# Patient Record
Sex: Female | Born: 1990 | Race: White | Hispanic: No | Marital: Married | Smoking: Never smoker
Health system: Southern US, Community
[De-identification: ages and names within clinical notes are randomized; demographics above are authoritative.]

## PROBLEM LIST (undated history)

## (undated) ENCOUNTER — Inpatient Hospital Stay (HOSPITAL_COMMUNITY): Payer: Self-pay

## (undated) ENCOUNTER — Emergency Department (HOSPITAL_COMMUNITY): Payer: Self-pay | Source: Home / Self Care

## (undated) DIAGNOSIS — F32A Depression, unspecified: Secondary | ICD-10-CM

## (undated) DIAGNOSIS — K760 Fatty (change of) liver, not elsewhere classified: Secondary | ICD-10-CM

## (undated) DIAGNOSIS — D649 Anemia, unspecified: Secondary | ICD-10-CM

## (undated) DIAGNOSIS — R519 Headache, unspecified: Secondary | ICD-10-CM

## (undated) DIAGNOSIS — F329 Major depressive disorder, single episode, unspecified: Secondary | ICD-10-CM

## (undated) DIAGNOSIS — O039 Complete or unspecified spontaneous abortion without complication: Secondary | ICD-10-CM

## (undated) DIAGNOSIS — O165 Unspecified maternal hypertension, complicating the puerperium: Secondary | ICD-10-CM

## (undated) DIAGNOSIS — O1490 Unspecified pre-eclampsia, unspecified trimester: Secondary | ICD-10-CM

## (undated) DIAGNOSIS — O209 Hemorrhage in early pregnancy, unspecified: Secondary | ICD-10-CM

## (undated) DIAGNOSIS — Z3046 Encounter for surveillance of implantable subdermal contraceptive: Principal | ICD-10-CM

## (undated) DIAGNOSIS — Z309 Encounter for contraceptive management, unspecified: Secondary | ICD-10-CM

## (undated) DIAGNOSIS — R51 Headache: Secondary | ICD-10-CM

## (undated) HISTORY — DX: Complete or unspecified spontaneous abortion without complication: O03.9

## (undated) HISTORY — DX: Encounter for surveillance of implantable subdermal contraceptive: Z30.46

## (undated) HISTORY — DX: Anemia, unspecified: D64.9

## (undated) HISTORY — DX: Unspecified maternal hypertension, complicating the puerperium: O16.5

## (undated) HISTORY — DX: Hemorrhage in early pregnancy, unspecified: O20.9

## (undated) HISTORY — DX: Encounter for contraceptive management, unspecified: Z30.9

## (undated) HISTORY — DX: Fatty (change of) liver, not elsewhere classified: K76.0

## (undated) HISTORY — PX: NO PAST SURGERIES: SHX2092

---
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
## 2012-12-21 ENCOUNTER — Encounter (HOSPITAL_COMMUNITY): Payer: Self-pay

## 2012-12-21 ENCOUNTER — Emergency Department (HOSPITAL_COMMUNITY)
Admission: EM | Admit: 2012-12-21 | Discharge: 2012-12-22 | Disposition: A | Discharge status: Home / Self Care | Payer: Self-pay | Attending: Emergency Medicine | Admitting: Emergency Medicine

## 2012-12-21 DIAGNOSIS — H6092 Unspecified otitis externa, left ear: Secondary | ICD-10-CM

## 2012-12-21 DIAGNOSIS — H60399 Other infective otitis externa, unspecified ear: Secondary | ICD-10-CM | POA: Insufficient documentation

## 2012-12-21 NOTE — ED Notes (Signed)
Left ear pain since yesterday

## 2012-12-22 MED ORDER — NEOMYCIN-POLYMYXIN-HC 3.5-10000-1 OT SOLN
4.0000 [drp] | Freq: Four times a day (QID) | OTIC | Status: DC
  Administered 2012-12-22: 4 [drp] via OTIC
  Filled 2012-12-22: qty 10

## 2012-12-22 NOTE — ED Provider Notes (Signed)
History     CSN: [id number]  Arrival date & time 12/21/12  2338   First MD Initiated Contact with Patient 12/22/12 0006      Chief Complaint  Patient presents with  . Otalgia    (Consider location/radiation/quality/duration/timing/severity/associated sxs/prior treatment) HPI Melanie Price is a 22 y.o. female who presents to the ED with left ear pain that started yesterday. She denies fever or chills. The pain is worse with movement of the external ear. She denies any other problems.  History reviewed. No pertinent past medical history.  History reviewed. No pertinent past surgical history.  No family history on file.  History  Substance Use Topics  . Smoking status: Never Smoker   . Smokeless tobacco: Not on file  . Alcohol Use: No    OB History   Grav Para Term Preterm Abortions TAB SAB Ect Mult Living                  Review of Systems  Constitutional: Negative for fever and chills.  HENT: Positive for ear pain. Negative for congestion, sore throat, neck pain and sinus pressure.   Eyes: Negative for pain.  Respiratory: Negative for cough.   Gastrointestinal: Negative for nausea and vomiting.  Skin: Negative for rash.  Neurological: Negative for headaches.  Psychiatric/Behavioral: The patient is not nervous/anxious.     Allergies  Review of patient's allergies indicates no known allergies.  Home Medications  No current outpatient prescriptions on file.  BP 118/67  Pulse 93  Temp(Src) 98.4 F (36.9 C) (Oral)  Resp 16  Ht 5\' 7"  (1.702 m)  Wt 130 lb (58.968 kg)  BMI 20.36 kg/m2  SpO2 100%  LMP 12/04/2012  Physical Exam  Nursing note and vitals reviewed. Constitutional: She is oriented to person, place, and time. She appears well-developed and well-nourished.  HENT:  Head: Normocephalic.  Right Ear: Tympanic membrane normal.  Left Ear: There is tenderness. No mastoid tenderness.  There is swelling and white drainage in the left ear canal. Unable to  visualize the TM. There is tenderness of the ear canal with movement of the external ear.   Eyes: EOM are normal.  Neck: Neck supple.  Cardiovascular: Normal rate.   Pulmonary/Chest: Effort normal.  Musculoskeletal: Normal range of motion.  Neurological: She is alert and oriented to person, place, and time. No cranial nerve deficit.  Skin: Skin is warm and dry.  Psychiatric: She has a normal mood and affect. Her behavior is normal. Judgment and thought content normal.    ED Course  Procedures (including critical care time)  MDM  22 y.o. female with external otitis. I have discussed findings with the patient and plan of care. Will administer Cortisporin Otic Suspension ear drops now and patient will continue every 6 hours. She will return if her symptoms worsen. She will take ibuprofen for the discomfort.  I have reviewed this patient's vital signs, nurses notes and patient is stable for discharge home.       [address] [place], [place] 12/22/12 [phone]

## 2012-12-22 NOTE — ED Provider Notes (Signed)
Medical screening examination/treatment/procedure(s) were performed by non-physician practitioner and as supervising physician I was immediately available for consultation/collaboration.  Nicoletta Dress. Colon Branch, MD 12/22/12 [phone]

## 2012-12-31 ENCOUNTER — Encounter (HOSPITAL_COMMUNITY): Payer: Self-pay

## 2012-12-31 ENCOUNTER — Emergency Department (HOSPITAL_COMMUNITY)
Admission: EM | Admit: 2012-12-31 | Discharge: 2012-12-31 | Disposition: A | Discharge status: Home / Self Care | Payer: Self-pay | Attending: Emergency Medicine | Admitting: Emergency Medicine

## 2012-12-31 DIAGNOSIS — H6123 Impacted cerumen, bilateral: Secondary | ICD-10-CM

## 2012-12-31 DIAGNOSIS — H6120 Impacted cerumen, unspecified ear: Secondary | ICD-10-CM | POA: Insufficient documentation

## 2012-12-31 MED ORDER — DOCUSATE SODIUM 50 MG/5ML PO LIQD
50.0000 mg | Freq: Once | ORAL | Status: AC
  Administered 2012-12-31: 5 mg via OTIC
  Filled 2012-12-31: qty 10

## 2012-12-31 MED ORDER — DOCUSATE SODIUM 50 MG/5ML PO LIQD
ORAL | Status: AC
  Filled 2012-12-31: qty 10

## 2012-12-31 NOTE — ED Provider Notes (Signed)
History     CSN: [id number]  Arrival date & time 12/31/12  0220   First MD Initiated Contact with Patient 12/31/12 [phone]      Chief Complaint  Patient presents with  . Otalgia    (Consider location/radiation/quality/duration/timing/severity/associated sxs/prior treatment) HPI Patient states she was seen about 2 weeks ago for pain in her left ear. She was told she had some ear wax in her ear and she was given eardrops to use. She states she feels like she can't hear out of either ear. She also states she feels like there is a polyp in her ear canal on the left. She denies fever or coughing states has mild rhinorrhea.  PCP none  History reviewed. No pertinent past medical history.  History reviewed. No pertinent past surgical history.  No family history on file.  History  Substance Use Topics  . Smoking status: Never Smoker   . Smokeless tobacco: Not on file  . Alcohol Use: No  employed  OB History   Grav Para Term Preterm Abortions TAB SAB Ect Mult Living                  Review of Systems  All other systems reviewed and are negative.    Allergies  Review of patient's allergies indicates no known allergies.  Home Medications  No current outpatient prescriptions on file.  BP 113/71  Pulse 73  Temp(Src) 98.3 F (36.8 C) (Oral)  Resp 16  Ht 5\' 7"  (1.702 m)  Wt 130 lb (58.968 kg)  BMI 20.36 kg/m2  SpO2 100%  LMP 12/04/2012  Vital signs normal    Physical Exam  Nursing note and vitals reviewed. Constitutional: She is oriented to person, place, and time. She appears well-developed and well-nourished.  Non-toxic appearance. She does not appear ill. No distress.  HENT:  Head: Normocephalic and atraumatic.  Right Ear: External ear normal.  Left Ear: External ear normal.  Nose: Nose normal. No mucosal edema or rhinorrhea.  Mouth/Throat: Oropharynx is clear and moist and mucous membranes are normal. No dental abscesses or edematous.  Patient has bilateral  cerumen impaction that is deep into her ear canal.   Eyes: Conjunctivae and EOM are normal. Pupils are equal, round, and reactive to light.  Neck: Normal range of motion and full passive range of motion without pain. Neck supple.  Pulmonary/Chest: Effort normal. No respiratory distress. She has no rhonchi. She exhibits no crepitus.  Abdominal: Normal appearance.  Musculoskeletal: Normal range of motion. She exhibits no edema and no tenderness.  Moves all extremities well.   Lymphadenopathy:    She has no cervical adenopathy.  Neurological: She is alert and oriented to person, place, and time. She has normal strength. No cranial nerve deficit.  Skin: Skin is warm, dry and intact. No rash noted. No erythema. No pallor.  Psychiatric: She has a normal mood and affect. Her speech is normal and behavior is normal. Her mood appears not anxious.    ED Course  Procedures (including critical care time)  Medications  docusate (COLACE) 50 MG/5ML liquid 50 mg (5 mg Both Ears Given 12/31/12 0451)   Nursing hs flushed her ears without results    1. Cerumen impaction, bilateral     Medication OTC debrox   Plan discharge  Devoria Albe, MD, FACEP   MDM          Ward Givens, MD 12/31/12 [phone]

## 2012-12-31 NOTE — ED Notes (Signed)
Pt states she was seen here a couple of weeks ago for ear pain and given drops.  Pt states pain improved for couple of days, now pain is into jaw and neck

## 2013-08-06 NOTE — L&D Delivery Note (Signed)
Delivery Note At 8:01 PM a viable and healthy female was delivered via Vaginal, Spontaneous Delivery (Presentation: Right Occiput Anterior).  APGAR: 8, 10; weight  .   Placenta status: Intact, Spontaneous.  Cord: 3 vessels with the following complications: None.   Anesthesia: Epidural  Episiotomy: none Lacerations:  1st degree labial, repaired   Suture Repair: 3.0 vicryl Est. Blood Loss (mL): 240   Mother doing well, bonding well with baby, skin to skin  Mom to postpartum.  Baby to Couplet care / Skin to Skin.  ADAMS,SHNIQUAL SHWON 06/17/2014, 8:42 PM   I was present for the above.  CRESENZO-DISHMAN,Peggy Loge

## 2013-10-21 ENCOUNTER — Encounter (HOSPITAL_COMMUNITY): Payer: Self-pay | Admitting: Emergency Medicine

## 2013-10-21 DIAGNOSIS — O9989 Other specified diseases and conditions complicating pregnancy, childbirth and the puerperium: Secondary | ICD-10-CM | POA: Insufficient documentation

## 2013-10-21 DIAGNOSIS — R109 Unspecified abdominal pain: Secondary | ICD-10-CM | POA: Insufficient documentation

## 2013-10-21 NOTE — ED Notes (Signed)
Pt denies any vaginal bleeding or discharge 

## 2013-10-21 NOTE — ED Notes (Signed)
Pt reports having a positive pregnancy test x1 week ago with LMP about 1.5 months ago. C/o abdominal cramping x 2-3 weeks.

## 2013-10-22 ENCOUNTER — Emergency Department (HOSPITAL_COMMUNITY)
Admission: EM | Admit: 2013-10-22 | Discharge: 2013-10-22 | Disposition: A | Discharge status: Home / Self Care | Payer: Medicaid Other | Attending: Emergency Medicine | Admitting: Emergency Medicine

## 2013-10-22 ENCOUNTER — Emergency Department (HOSPITAL_COMMUNITY): Payer: Medicaid Other

## 2013-10-22 DIAGNOSIS — O26899 Other specified pregnancy related conditions, unspecified trimester: Secondary | ICD-10-CM

## 2013-10-22 DIAGNOSIS — R109 Unspecified abdominal pain: Secondary | ICD-10-CM

## 2013-10-22 LAB — CBC
HCT: 33.2 % — ABNORMAL LOW (ref 36.0–46.0)
HEMOGLOBIN: 10.2 g/dL — AB (ref 12.0–15.0)
MCH: 23.3 pg — AB (ref 26.0–34.0)
MCHC: 30.7 g/dL (ref 30.0–36.0)
MCV: 75.8 fL — ABNORMAL LOW (ref 78.0–100.0)
PLATELETS: 404 10*3/uL — AB (ref 150–400)
RBC: 4.38 MIL/uL (ref 3.87–5.11)
RDW: 16.1 % — AB (ref 11.5–15.5)
WBC: 10.5 10*3/uL (ref 4.0–10.5)

## 2013-10-22 LAB — URINE MICROSCOPIC-ADD ON

## 2013-10-22 LAB — URINALYSIS, ROUTINE W REFLEX MICROSCOPIC
Bilirubin Urine: NEGATIVE
Glucose, UA: NEGATIVE mg/dL
Hgb urine dipstick: NEGATIVE
Ketones, ur: NEGATIVE mg/dL
NITRITE: NEGATIVE
PH: 5.5 (ref 5.0–8.0)
Protein, ur: NEGATIVE mg/dL
UROBILINOGEN UA: 0.2 mg/dL (ref 0.0–1.0)

## 2013-10-22 LAB — BASIC METABOLIC PANEL
BUN: 10 mg/dL (ref 6–23)
CALCIUM: 9.1 mg/dL (ref 8.4–10.5)
CO2: 26 mEq/L (ref 19–32)
Chloride: 101 mEq/L (ref 96–112)
Creatinine, Ser: 0.66 mg/dL (ref 0.50–1.10)
GLUCOSE: 109 mg/dL — AB (ref 70–99)
Potassium: 4.3 mEq/L (ref 3.7–5.3)
SODIUM: 137 meq/L (ref 137–147)

## 2013-10-22 LAB — HCG, QUANTITATIVE, PREGNANCY: HCG, BETA CHAIN, QUANT, S: 12949 m[IU]/mL — AB (ref <5)

## 2013-10-22 LAB — PREGNANCY, URINE: PREG TEST UR: POSITIVE — AB

## 2013-10-22 LAB — ABO/RH: ABO/RH(D): O POS

## 2013-10-22 MED ORDER — CEPHALEXIN 500 MG PO CAPS: 500.0000 mg | ORAL_CAPSULE | Freq: Four times a day (QID) | ORAL | Status: DC

## 2013-10-22 MED ORDER — ACETAMINOPHEN 325 MG PO TABS
650.0000 mg | ORAL_TABLET | Freq: Once | ORAL | Status: AC
  Administered 2013-10-22: 650 mg via ORAL
  Filled 2013-10-22: qty 2

## 2013-10-22 NOTE — Discharge Instructions (Signed)
Abdominal Pain During Pregnancy °Abdominal pain is common in pregnancy. Most of the time, it does not cause harm. There are many causes of abdominal pain. Some causes are more serious than others. Some of the causes of abdominal pain in pregnancy are easily diagnosed. Occasionally, the diagnosis takes time to understand. Other times, the cause is not determined. Abdominal pain can be a sign that something is very wrong with the pregnancy, or the pain may have nothing to do with the pregnancy at all. For this reason, always tell your health care provider if you have any abdominal discomfort. °HOME CARE INSTRUCTIONS  °Monitor your abdominal pain for any changes. The following actions may help to alleviate any discomfort you are experiencing: °· Do not have sexual intercourse or put anything in your vagina until your symptoms go away completely. °· Get plenty of rest until your pain improves. °· Drink clear fluids if you feel nauseous. Avoid solid food as long as you are uncomfortable or nauseous. °· Only take over-the-counter or prescription medicine as directed by your health care provider. °· Keep all follow-up appointments with your health care provider. °SEEK IMMEDIATE MEDICAL CARE IF: °· You are bleeding, leaking fluid, or passing tissue from the vagina. °· You have increasing pain or cramping. °· You have persistent vomiting. °· You have painful or bloody urination. °· You have a fever. °· You notice a decrease in your baby's movements. °· You have extreme weakness or feel faint. °· You have shortness of breath, with or without abdominal pain. °· You develop a severe headache with abdominal pain. °· You have abnormal vaginal discharge with abdominal pain. °· You have persistent diarrhea. °· You have abdominal pain that continues even after rest, or gets worse. °MAKE SURE YOU:  °· Understand these instructions. °· Will watch your condition. °· Will get help right away if you are not doing well or get  worse. °Document Released: 07/23/2005 Document Revised: 05/13/2013 Document Reviewed: 02/19/2013 °[employer]® Patient Information ©2014 [employer]. ° °

## 2013-10-22 NOTE — ED Provider Notes (Signed)
CSN: [id number]     Arrival date & time 10/21/13  2328 History   First MD Initiated Contact with Patient 10/22/13 0140     Chief Complaint  Patient presents with  . Abdominal Cramping     (Consider location/radiation/quality/duration/timing/severity/associated sxs/prior Treatment) HPI History per patient. LMP about 2 months ago. Positive home pregnancy test. Tonight developed abdominal cramping, midline. Mild to moderate in severity. No back pain. No radiation of pain. No associated vaginal bleeding or discharge. No dysuria, urgency or frequency. No history of same.  No previous pregnancies. No nausea vomiting or diarrhea. History reviewed. No pertinent past medical history. History reviewed. No pertinent past surgical history. No family history on file. History  Substance Use Topics  . Smoking status: Never Smoker   . Smokeless tobacco: Not on file  . Alcohol Use: No   OB History   Grav Para Term Preterm Abortions TAB SAB Ect Mult Living                 Review of Systems  Constitutional: Negative for fever and chills.  Respiratory: Negative for shortness of breath.   Cardiovascular: Negative for chest pain.  Gastrointestinal: Negative for vomiting and abdominal distention.  Genitourinary: Negative for dysuria, frequency, vaginal bleeding and vaginal discharge.  Musculoskeletal: Negative for back pain.  Skin: Negative for rash.  Neurological: Negative for weakness and numbness.  All other systems reviewed and are negative.      Allergies  Review of patient's allergies indicates no known allergies.  Home Medications  No current outpatient prescriptions on file. BP 130/68  Pulse 101  Temp(Src) 97.3 F (36.3 C) (Oral)  Resp 18  Ht 5\' 7"  (1.702 m)  Wt 130 lb (58.968 kg)  BMI 20.36 kg/m2  SpO2 100%  LMP 09/05/2013 Physical Exam  Constitutional: She is oriented to person, place, and time. She appears well-developed and well-nourished.  HENT:  Head: Normocephalic  and atraumatic.  Mouth/Throat: Oropharynx is clear and moist.  Eyes: EOM are normal. Pupils are equal, round, and reactive to light.  Neck: Neck supple.  Cardiovascular: Normal rate, regular rhythm and intact distal pulses.   Pulmonary/Chest: Effort normal and breath sounds normal. No respiratory distress.  Abdominal: Soft. Bowel sounds are normal. She exhibits no distension. There is no tenderness. There is no rebound and no guarding.  No CVA tenderness  Musculoskeletal: Normal range of motion. She exhibits no edema.  Neurological: She is alert and oriented to person, place, and time.  Skin: Skin is warm and dry.    ED Course  Procedures (including critical care time) Labs Review Labs Reviewed  URINALYSIS, ROUTINE W REFLEX MICROSCOPIC - Abnormal; Notable for the following:    Specific Gravity, Urine >1.030 (*)    Leukocytes, UA TRACE (*)    All other components within normal limits  PREGNANCY, URINE - Abnormal; Notable for the following:    Preg Test, Ur POSITIVE (*)    All other components within normal limits  HCG, QUANTITATIVE, PREGNANCY - Abnormal; Notable for the following:    hCG, Beta Chain, Quant, S [id number] (*)    All other components within normal limits  CBC - Abnormal; Notable for the following:    Hemoglobin 10.2 (*)    HCT 33.2 (*)    MCV 75.8 (*)    MCH 23.3 (*)    RDW 16.1 (*)    Platelets 404 (*)    All other components within normal limits  BASIC METABOLIC PANEL - Abnormal; Notable for the  following:    Glucose, Bld 109 (*)    All other components within normal limits  URINE MICROSCOPIC-ADD ON - Abnormal; Notable for the following:    Squamous Epithelial / LPF MANY (*)    Bacteria, UA MANY (*)    All other components within normal limits  ABO/RH   Imaging Review [place] Ob Comp Less 14 Wks  10/22/2013   CLINICAL DATA:  Pregnant patient with pelvic cramping.  EXAM: OBSTETRIC <14 WK US AND TRANSVAGINAL OB US  TECHNIQUE: Both transabdominal and transvaginal  ultrasound examinations were performed for complete evaluation of the gestation as well as the maternal uterus, adnexal regions, and pelvic cul-de-sac. Transvaginal technique was performed to assess early pregnancy.  COMPARISON:  None.  FINDINGS: Intrauterine gestational sac: Visualized/normal in shape.  Yolk sac:  Visualized.  Embryo:  Visualized.  Cardiac Activity: Detected.  Heart Rate:  130 bpm  CRL:   5.6  mm   6 w 2 d                  US EDC: 06/15/2014  Maternal uterus/adnexae: Unremarkable.  IMPRESSION: Single living intrauterine pregnancy as described. No acute finding.   Electronically Signed   By: Drusilla Kannerhomas  Dalessio M.D.   On: 10/22/2013 03:23   [place] Ob Transvaginal  10/22/2013   CLINICAL DATA:  Pregnant patient with pelvic cramping.  EXAM: OBSTETRIC <14 WK US AND TRANSVAGINAL OB US  TECHNIQUE: Both transabdominal and transvaginal ultrasound examinations were performed for complete evaluation of the gestation as well as the maternal uterus, adnexal regions, and pelvic cul-de-sac. Transvaginal technique was performed to assess early pregnancy.  COMPARISON:  None.  FINDINGS: Intrauterine gestational sac: Visualized/normal in shape.  Yolk sac:  Visualized.  Embryo:  Visualized.  Cardiac Activity: Detected.  Heart Rate:  130 bpm  CRL:   5.6  mm   6 w 2 d                  US EDC: 06/15/2014  Maternal uterus/adnexae: Unremarkable.  IMPRESSION: Single living intrauterine pregnancy as described. No acute finding.   Electronically Signed   By: Drusilla Kannerhomas  Dalessio M.D.   On: 10/22/2013 03:23    Tylenol provided. On recheck, no further cramping. Abdomen soft, nontender, nondistended.  Ultrasound results reviewed and shared with patient as above. Plan discharge home with outpatient OB/GYN followup. Patient will start taking prenatal vitamins. She agrees to strict return precautions.  MDM   Diagnosis: Pregnancy, abdominal cramping  Evaluated with labs, urinalysis and ultrasound as above. No ectopic  pregnancy. Medication provided Vital signs and nursing notes reviewed and considered   Sunnie NielsenBrian Grahm Etsitty, MD 10/22/13 [phone]

## 2013-10-22 NOTE — ED Notes (Signed)
Pt reporting abdominal cramping for several days. Denies nausea or vomiting.  Reports positive home pregnancy test.  Denies bleeding or discharge.  Pt also asking about obtaining a due date.

## 2013-11-05 ENCOUNTER — Other Ambulatory Visit: Payer: Self-pay | Admitting: Obstetrics & Gynecology

## 2013-11-05 DIAGNOSIS — O3680X0 Pregnancy with inconclusive fetal viability, not applicable or unspecified: Secondary | ICD-10-CM

## 2013-11-10 ENCOUNTER — Ambulatory Visit (INDEPENDENT_AMBULATORY_CARE_PROVIDER_SITE_OTHER): Payer: Medicaid Other

## 2013-11-10 DIAGNOSIS — O3680X0 Pregnancy with inconclusive fetal viability, not applicable or unspecified: Secondary | ICD-10-CM

## 2013-11-10 NOTE — Progress Notes (Signed)
U/S(9+0wks)-single IUP with +FCA noted, FHR-180 BPM, cx appears closed, bilateral adnexa wnl, CRL c/w dates

## 2013-11-19 ENCOUNTER — Other Ambulatory Visit (HOSPITAL_COMMUNITY)
Admission: RE | Admit: 2013-11-19 | Discharge: 2013-11-19 | Disposition: A | Discharge status: Home / Self Care | Payer: Medicaid Other | Source: Ambulatory Visit | Attending: Obstetrics and Gynecology | Admitting: Obstetrics and Gynecology

## 2013-11-19 ENCOUNTER — Encounter: Payer: Self-pay | Admitting: Advanced Practice Midwife

## 2013-11-19 ENCOUNTER — Ambulatory Visit (INDEPENDENT_AMBULATORY_CARE_PROVIDER_SITE_OTHER): Payer: Medicaid Other | Admitting: Advanced Practice Midwife

## 2013-11-19 VITALS — BP 110/72 | Wt 150.0 lb

## 2013-11-19 DIAGNOSIS — Z331 Pregnant state, incidental: Secondary | ICD-10-CM

## 2013-11-19 DIAGNOSIS — Z1389 Encounter for screening for other disorder: Secondary | ICD-10-CM

## 2013-11-19 DIAGNOSIS — Z01419 Encounter for gynecological examination (general) (routine) without abnormal findings: Secondary | ICD-10-CM | POA: Insufficient documentation

## 2013-11-19 DIAGNOSIS — Z3490 Encounter for supervision of normal pregnancy, unspecified, unspecified trimester: Secondary | ICD-10-CM

## 2013-11-19 DIAGNOSIS — Z3400 Encounter for supervision of normal first pregnancy, unspecified trimester: Secondary | ICD-10-CM | POA: Insufficient documentation

## 2013-11-19 DIAGNOSIS — O99019 Anemia complicating pregnancy, unspecified trimester: Secondary | ICD-10-CM

## 2013-11-19 LAB — CBC
HCT: 34.4 % — ABNORMAL LOW (ref 36.0–46.0)
HEMOGLOBIN: 11.1 g/dL — AB (ref 12.0–15.0)
MCH: 24 pg — AB (ref 26.0–34.0)
MCHC: 32.3 g/dL (ref 30.0–36.0)
MCV: 74.5 fL — ABNORMAL LOW (ref 78.0–100.0)
Platelets: 402 10*3/uL — ABNORMAL HIGH (ref 150–400)
RBC: 4.62 MIL/uL (ref 3.87–5.11)
RDW: 19.2 % — ABNORMAL HIGH (ref 11.5–15.5)
WBC: 11.2 10*3/uL — ABNORMAL HIGH (ref 4.0–10.5)

## 2013-11-19 LAB — POCT URINALYSIS DIPSTICK
Blood, UA: NEGATIVE
Glucose, UA: NEGATIVE
KETONES UA: NEGATIVE
LEUKOCYTES UA: NEGATIVE
Nitrite, UA: NEGATIVE
Protein, UA: NEGATIVE

## 2013-11-19 NOTE — Patient Instructions (Signed)
Pregnancy - First Trimester  During sexual intercourse, millions of sperm go into the vagina. Only 1 sperm will penetrate and fertilize the female egg while it is in the Fallopian tube. One week later, the fertilized egg implants into the wall of the uterus. An embryo begins to develop into a baby. At 6 to 8 weeks, the eyes and face are formed and the heartbeat can be seen on ultrasound. At the end of 12 weeks (first trimester), all the baby's organs are formed. Now that you are pregnant, you will want to do everything you can to have a healthy baby. Two of the most important things are to get good prenatal care and follow your caregiver's instructions. Prenatal care is all the medical care you receive before the baby's birth. It is given to prevent, find, and treat problems during the pregnancy and childbirth.  PRENATAL EXAMS  · During prenatal visits, your weight, blood pressure, and urine are checked. This is done to make sure you are healthy and progressing normally during the pregnancy.  · A pregnant woman should gain 25 to 35 pounds during the pregnancy. However, if you are overweight or underweight, your caregiver will advise you regarding your weight.  · Your caregiver will ask and answer questions for you.  · Blood work, cervical cultures, other necessary tests, and a Pap test are done during your prenatal exams. These tests are done to check on your health and the probable health of your baby. Tests are strongly recommended and done for HIV with your permission. This is the virus that causes AIDS. These tests are done because medicines can be given to help prevent your baby from being born with this infection should you have been infected without knowing it. Blood work is also used to find out your blood type, previous infections, and follow your blood levels (hemoglobin).  · Low hemoglobin (anemia) is common during pregnancy. Iron and vitamins are given to help prevent this. Later in the pregnancy, blood  tests for diabetes will be done along with any other tests if any problems develop.  · You may need other tests to make sure you and the baby are doing well.  CHANGES DURING THE FIRST TRIMESTER   Your body goes through many changes during pregnancy. They vary from person to person. Talk to your caregiver about changes you notice and are concerned about. Changes can include:  · Your menstrual period stops.  · The egg and sperm carry the genes that determine what you look like. Genes from you and your partner are forming a baby. The female genes determine whether the baby is a boy or a girl.  · Your body increases in girth and you may feel bloated.  · Feeling sick to your stomach (nauseous) and throwing up (vomiting). If the vomiting is uncontrollable, call your caregiver.  · Your breasts will begin to enlarge and become tender.  · Your nipples may stick out more and become darker.  · The need to urinate more. Painful urination may mean you have a bladder infection.  · Tiring easily.  · Loss of appetite.  · Cravings for certain kinds of food.  · At first, you may gain or lose a couple of pounds.  · You may have changes in your emotions from day to day (excited to be pregnant or concerned something may go wrong with the pregnancy and baby).  · You may have more vivid and strange dreams.  HOME CARE INSTRUCTIONS   ·   It is very important to avoid all smoking, alcohol and non-prescribed drugs during your pregnancy. These affect the formation and growth of the baby. Avoid chemicals while pregnant to ensure the delivery of a healthy infant.  · Start your prenatal visits by the 12th week of pregnancy. They are usually scheduled monthly at first, then more often in the last 2 months before delivery. Keep your caregiver's appointments. Follow your caregiver's instructions regarding medicine use, blood and lab tests, exercise, and diet.  · During pregnancy, you are providing food for you and your baby. Eat regular, well-balanced  meals. Choose foods such as meat, fish, milk and other low fat dairy products, vegetables, fruits, and whole-grain breads and cereals. Your caregiver will tell you of the ideal weight gain.  · You can help morning sickness by keeping soda crackers at the bedside. Eat a couple before arising in the morning. You may want to use the crackers without salt on them.  · Eating 4 to 5 small meals rather than 3 large meals a day also may help the nausea and vomiting.  · Drinking liquids between meals instead of during meals also seems to help nausea and vomiting.  · A physical sexual relationship may be continued throughout pregnancy if there are no other problems. Problems may be early (premature) leaking of amniotic fluid from the membranes, vaginal bleeding, or belly (abdominal) pain.  · Exercise regularly if there are no restrictions. Check with your caregiver or physical therapist if you are unsure of the safety of some of your exercises. Greater weight gain will occur in the last 2 trimesters of pregnancy. Exercising will help:  · Control your weight.  · Keep you in shape.  · Prepare you for labor and delivery.  · Help you lose your pregnancy weight after you deliver your baby.  · Wear a good support or jogging bra for breast tenderness during pregnancy. This may help if worn during sleep too.  · Ask when prenatal classes are available. Begin classes when they are offered.  · Do not use hot tubs, steam rooms, or saunas.  · Wear your seat belt when driving. This protects you and your baby if you are in an accident.  · Avoid raw meat, uncooked cheese, cat litter boxes, and soil used by cats throughout the pregnancy. These carry germs that can cause birth defects in the baby.  · The first trimester is a good time to visit your dentist for your dental health. Getting your teeth cleaned is okay. Use a softer toothbrush and brush gently during pregnancy.  · Ask for help if you have financial, counseling, or nutritional needs  during pregnancy. Your caregiver will be able to offer counseling for these needs as well as refer you for other special needs.  · Do not take any medicines or herbs unless told by your caregiver.  · Inform your caregiver if there is any mental or physical domestic violence.  · Make a list of emergency phone numbers of family, friends, hospital, and police and fire departments.  · Write down your questions. Take them to your prenatal visit.  · Do not douche.  · Do not cross your legs.  · If you have to stand for long periods of time, rotate you feet or take small steps in a circle.  · You may have more vaginal secretions that may require a sanitary pad. Do not use tampons or scented sanitary pads.  MEDICINES AND DRUG USE IN PREGNANCY  ·   Take prenatal vitamins as directed. The vitamin should contain 1 milligram of folic acid. Keep all vitamins out of reach of children. Only a couple vitamins or tablets containing iron may be fatal to a baby or young child when ingested.  · Avoid use of all medicines, including herbs, over-the-counter medicines, not prescribed or suggested by your caregiver. Only take over-the-counter or prescription medicines for pain, discomfort, or fever as directed by your caregiver. Do not use aspirin, ibuprofen, or naproxen unless directed by your caregiver.  · Let your caregiver also know about herbs you may be using.  · Alcohol is related to a number of birth defects. This includes fetal alcohol syndrome. All alcohol, in any form, should be avoided completely. Smoking will cause low birth rate and premature babies.  · Street or illegal drugs are very harmful to the baby. They are absolutely forbidden. A baby born to an addicted mother will be addicted at birth. The baby will go through the same withdrawal an adult does.  · Let your caregiver know about any medicines that you have to take and for what reason you take them.  SEEK MEDICAL CARE IF:   You have any concerns or worries during your  pregnancy. It is better to call with your questions if you feel they cannot wait, rather than worry about them.  SEEK IMMEDIATE MEDICAL CARE IF:   · An unexplained oral temperature above 102° F (38.9° C) develops, or as your caregiver suggests.  · You have leaking of fluid from the vagina (birth canal). If leaking membranes are suspected, take your temperature and inform your caregiver of this when you call.  · There is vaginal spotting or bleeding. Notify your caregiver of the amount and how many pads are used.  · You develop a bad smelling vaginal discharge with a change in the color.  · You continue to feel sick to your stomach (nauseated) and have no relief from remedies suggested. You vomit blood or coffee ground-like materials.  · You lose more than 2 pounds of weight in 1 week.  · You gain more than 2 pounds of weight in 1 week and you notice swelling of your face, hands, feet, or legs.  · You gain 5 pounds or more in 1 week (even if you do not have swelling of your hands, face, legs, or feet).  · You get exposed to German measles and have never had them.  · You are exposed to fifth disease or chickenpox.  · You develop belly (abdominal) pain. Round ligament discomfort is a common non-cancerous (benign) cause of abdominal pain in pregnancy. Your caregiver still must evaluate this.  · You develop headache, fever, diarrhea, pain with urination, or shortness of breath.  · You fall or are in a car accident or have any kind of trauma.  · There is mental or physical violence in your home.  Document Released: 07/17/2001 Document Revised: 04/16/2012 Document Reviewed: 01/18/2009  [employer]® Patient Information ©2014 [employer].

## 2013-11-19 NOTE — Progress Notes (Signed)
  Subjective:    Melanie Price is a G1P0 2553w2d being seen today for her first obstetrical visit.  Her obstetrical history is significant for primiparous.  Pregnancy history fully reviewed.  Patient reports no complaints.  Filed Vitals:   11/19/13 1328  BP: 110/72  Weight: 150 lb (68.04 kg)    HISTORY: OB History  Gravida Para Term Preterm AB SAB TAB Ectopic Multiple Living  1             # Outcome Date GA Lbr Len/2nd Weight Sex Delivery Anes PTL Lv  1 CUR              History reviewed. No pertinent past medical history. Past Surgical History  Procedure Laterality Date  . No past surgeries     Family History  Problem Relation Age of Onset  . Cancer Maternal Grandfather     skin     Exam       Pelvic Exam:    Perineum: Normal Perineum   Vulva: normal   Vagina:  normal mucosa, normal discharge, no palpable nodules   Uterus Gravid.  FHR 160 on u/s        Cervix: normal   Adnexa: Not palpable   Urinary:  urethral meatus normal    System: Breast:  normal appearance, no masses or tenderness   Skin: normal coloration and turgor, no rashes    Neurologic: oriented, normal, normal mood   Extremities: normal strength, tone, and muscle mass   HEENT PERRLA   Mouth/Teeth mucous membranes moist, pharynx normal without lesions   Neck supple and no masses   Cardiovascular: regular rate and rhythm   Respiratory:  appears well, vitals normal, no respiratory distress, acyanotic, normal RR   Abdomen: soft, non-tender; bowel sounds normal; no masses,  no organomegaly          Assessment:    Pregnancy: G1P0 Patient Active Problem List   Diagnosis Date Noted  . Pregnant 11/19/2013        Plan:     Initial labs drawn. Continue prenatal vitamins  Problem list reviewed and updated  Reviewed recommended weight gain based on pre-gravid BMI  Encouraged well-balanced diet Genetic Screening discussed Integrated Screen and Quad Screen: declined.  Ultrasound discussed;  fetal survey: requested.  Follow up in 4 weeks.  Jacklyn ShellFrances Cresenzo-Dishmon 11/19/2013

## 2013-11-20 LAB — RUBELLA SCREEN: RUBELLA: 1.18 {index} — AB (ref <0.90)

## 2013-11-20 LAB — ABO AND RH: RH TYPE: POSITIVE

## 2013-11-20 LAB — URINALYSIS, ROUTINE W REFLEX MICROSCOPIC
BILIRUBIN URINE: NEGATIVE
Glucose, UA: NEGATIVE mg/dL
Hgb urine dipstick: NEGATIVE
KETONES UR: NEGATIVE mg/dL
Leukocytes, UA: NEGATIVE
NITRITE: NEGATIVE
PH: 6 (ref 5.0–8.0)
Protein, ur: NEGATIVE mg/dL
SPECIFIC GRAVITY, URINE: 1.022 (ref 1.005–1.030)
Urobilinogen, UA: 0.2 mg/dL (ref 0.0–1.0)

## 2013-11-20 LAB — OXYCODONE SCREEN, UA, RFLX CONFIRM: Oxycodone Screen, Ur: NEGATIVE ng/mL

## 2013-11-20 LAB — DRUG SCREEN, URINE, NO CONFIRMATION
Amphetamine Screen, Ur: NEGATIVE
BARBITURATE QUANT UR: NEGATIVE
Benzodiazepines.: NEGATIVE
CREATININE, U: 264.6 mg/dL
Cocaine Metabolites: NEGATIVE
METHADONE: NEGATIVE
Marijuana Metabolite: NEGATIVE
OPIATE SCREEN, URINE: NEGATIVE
PROPOXYPHENE: NEGATIVE
Phencyclidine (PCP): NEGATIVE

## 2013-11-20 LAB — VARICELLA ZOSTER ANTIBODY, IGG: VARICELLA IGG: 1393 {index} — AB (ref <135.00)

## 2013-11-20 LAB — RPR

## 2013-11-20 LAB — HIV ANTIBODY (ROUTINE TESTING W REFLEX): HIV: NONREACTIVE

## 2013-11-20 LAB — HEPATITIS B SURFACE ANTIGEN: Hepatitis B Surface Ag: NEGATIVE

## 2013-11-20 LAB — ANTIBODY SCREEN: Antibody Screen: NEGATIVE

## 2013-11-21 LAB — URINE CULTURE
Colony Count: NO GROWTH
Organism ID, Bacteria: NO GROWTH

## 2013-12-12 ENCOUNTER — Encounter (HOSPITAL_COMMUNITY): Payer: Self-pay | Admitting: *Deleted

## 2013-12-12 ENCOUNTER — Inpatient Hospital Stay (HOSPITAL_COMMUNITY)
Admission: AD | Admit: 2013-12-12 | Discharge: 2013-12-12 | Disposition: A | Discharge status: Home / Self Care | Payer: Medicaid Other | Source: Ambulatory Visit | Attending: Family Medicine | Admitting: Family Medicine

## 2013-12-12 DIAGNOSIS — R1084 Generalized abdominal pain: Secondary | ICD-10-CM

## 2013-12-12 DIAGNOSIS — Z3490 Encounter for supervision of normal pregnancy, unspecified, unspecified trimester: Secondary | ICD-10-CM

## 2013-12-12 DIAGNOSIS — R109 Unspecified abdominal pain: Secondary | ICD-10-CM | POA: Insufficient documentation

## 2013-12-12 DIAGNOSIS — O26899 Other specified pregnancy related conditions, unspecified trimester: Secondary | ICD-10-CM

## 2013-12-12 DIAGNOSIS — O9989 Other specified diseases and conditions complicating pregnancy, childbirth and the puerperium: Principal | ICD-10-CM

## 2013-12-12 DIAGNOSIS — O99891 Other specified diseases and conditions complicating pregnancy: Secondary | ICD-10-CM

## 2013-12-12 LAB — URINE MICROSCOPIC-ADD ON

## 2013-12-12 LAB — URINALYSIS, ROUTINE W REFLEX MICROSCOPIC
Bilirubin Urine: NEGATIVE
GLUCOSE, UA: NEGATIVE mg/dL
HGB URINE DIPSTICK: NEGATIVE
Ketones, ur: NEGATIVE mg/dL
Nitrite: NEGATIVE
Protein, ur: NEGATIVE mg/dL
SPECIFIC GRAVITY, URINE: 1.015 (ref 1.005–1.030)
UROBILINOGEN UA: 0.2 mg/dL (ref 0.0–1.0)
pH: 8.5 — ABNORMAL HIGH (ref 5.0–8.0)

## 2013-12-12 LAB — CBC
HEMATOCRIT: 36.2 % (ref 36.0–46.0)
Hemoglobin: 11.6 g/dL — ABNORMAL LOW (ref 12.0–15.0)
MCH: 24.7 pg — ABNORMAL LOW (ref 26.0–34.0)
MCHC: 32 g/dL (ref 30.0–36.0)
MCV: 77.2 fL — AB (ref 78.0–100.0)
Platelets: 318 10*3/uL (ref 150–400)
RBC: 4.69 MIL/uL (ref 3.87–5.11)
RDW: 17.8 % — ABNORMAL HIGH (ref 11.5–15.5)
WBC: 14.5 10*3/uL — ABNORMAL HIGH (ref 4.0–10.5)

## 2013-12-12 LAB — WET PREP, GENITAL
CLUE CELLS WET PREP: NONE SEEN
Trich, Wet Prep: NONE SEEN
Yeast Wet Prep HPF POC: NONE SEEN

## 2013-12-12 NOTE — MAU Note (Signed)
Pt presents with complaints of lower abdominal cramping for a couple of weeks. Denies any vaginal bleeding or discharge.

## 2013-12-12 NOTE — Discharge Instructions (Signed)
Abdominal Pain During Pregnancy °Abdominal pain is common in pregnancy. Most of the time, it does not cause harm. There are many causes of abdominal pain. Some causes are more serious than others. Some of the causes of abdominal pain in pregnancy are easily diagnosed. Occasionally, the diagnosis takes time to understand. Other times, the cause is not determined. Abdominal pain can be a sign that something is very wrong with the pregnancy, or the pain may have nothing to do with the pregnancy at all. For this reason, always tell your health care provider if you have any abdominal discomfort. °HOME CARE INSTRUCTIONS  °Monitor your abdominal pain for any changes. The following actions may help to alleviate any discomfort you are experiencing: °· Do not have sexual intercourse or put anything in your vagina until your symptoms go away completely. °· Get plenty of rest until your pain improves. °· Drink clear fluids if you feel nauseous. Avoid solid food as long as you are uncomfortable or nauseous. °· Only take over-the-counter or prescription medicine as directed by your health care provider. °· Keep all follow-up appointments with your health care provider. °SEEK IMMEDIATE MEDICAL CARE IF: °· You are bleeding, leaking fluid, or passing tissue from the vagina. °· You have increasing pain or cramping. °· You have persistent vomiting. °· You have painful or bloody urination. °· You have a fever. °· You notice a decrease in your baby's movements. °· You have extreme weakness or feel faint. °· You have shortness of breath, with or without abdominal pain. °· You develop a severe headache with abdominal pain. °· You have abnormal vaginal discharge with abdominal pain. °· You have persistent diarrhea. °· You have abdominal pain that continues even after rest, or gets worse. °MAKE SURE YOU:  °· Understand these instructions. °· Will watch your condition. °· Will get help right away if you are not doing well or get  worse. °Document Released: 07/23/2005 Document Revised: 05/13/2013 Document Reviewed: 02/19/2013 °[employer]® Patient Information ©2014 [employer]. ° °

## 2013-12-12 NOTE — MAU Provider Note (Signed)
History     CSN: [id number]  Arrival date and time: 12/12/13 1222   First Provider Initiated Contact with Patient 12/12/13 1329      Chief Complaint  Patient presents with  . Possible Pregnancy   HPI Pt is 211w4d G1P0 with c/o abd pain located around umbilicus for a couple of weeks- worse at night. Pt denies constipation or diarrhea.  Pt denies vaginal discharge, spotting or bleeding.  Pt denies fever. Pt has had some nausea but no vomiting, no chills.  Pt usually lays down until sx resolve.  The pain lasts 10- 15 min and goes away but then returns.   RN note: Pt presents with complaints of lower abdominal cramping for a couple of weeks. Denies any vaginal bleeding or discharge     Past Medical History  Diagnosis Date  . Medical history non-contributory     Past Surgical History  Procedure Laterality Date  . No past surgeries      Family History  Problem Relation Age of Onset  . Cancer Maternal Grandfather     skin    History  Substance Use Topics  . Smoking status: Never Smoker   . Smokeless tobacco: Never Used  . Alcohol Use: No    Allergies: No Known Allergies  Prescriptions prior to admission  Medication Sig Dispense Refill  . cephALEXin (KEFLEX) 500 MG capsule Take 1 capsule (500 mg total) by mouth 4 (four) times daily.  28 capsule  0  . Prenatal Multivit-Min-Fe-FA (PRENATAL VITAMINS PO) Take 1 tablet by mouth daily.        Review of Systems  Constitutional: Negative for fever and chills.  Gastrointestinal: Positive for abdominal pain. Negative for nausea, vomiting, diarrhea and constipation.  Genitourinary: Negative for dysuria.   Physical Exam   Blood pressure 106/44, pulse 77, temperature 98.1 F (36.7 C), resp. rate 18, last menstrual period 09/05/2013.  Physical Exam  Nursing note and vitals reviewed. Constitutional: She is oriented to person, place, and time. She appears well-developed and well-nourished. No distress.  HENT:  Head:  Normocephalic.  Eyes: Pupils are equal, round, and reactive to light.  Neck: Normal range of motion. Neck supple.  Cardiovascular: Normal rate.   Respiratory: Effort normal.  GI: Soft. Bowel sounds are normal. She exhibits no distension. There is no tenderness. There is no rebound.  Genitourinary: Vagina normal. No vaginal discharge found.  Enlarged- size appropriate for GA +FHT  Musculoskeletal: Normal range of motion.  Neurological: She is alert and oriented to person, place, and time.  Skin: Skin is warm and dry.  Psychiatric: She has a normal mood and affect.    MAU Course  Procedures Results for orders placed during the hospital encounter of 12/12/13 (from the past 24 hour(s))  URINALYSIS, ROUTINE W REFLEX MICROSCOPIC     Status: Abnormal   Collection Time    12/12/13 12:50 PM      Result Value Ref Range   Color, Urine YELLOW  YELLOW   APPearance CLEAR  CLEAR   Specific Gravity, Urine 1.015  1.005 - 1.030   pH 8.5 (*) 5.0 - 8.0   Glucose, UA NEGATIVE  NEGATIVE mg/dL   Hgb urine dipstick NEGATIVE  NEGATIVE   Bilirubin Urine NEGATIVE  NEGATIVE   Ketones, ur NEGATIVE  NEGATIVE mg/dL   Protein, ur NEGATIVE  NEGATIVE mg/dL   Urobilinogen, UA 0.2  0.0 - 1.0 mg/dL   Nitrite NEGATIVE  NEGATIVE   Leukocytes, UA TRACE (*) NEGATIVE  URINE MICROSCOPIC-ADD ON  Status: Abnormal   Collection Time    12/12/13 12:50 PM      Result Value Ref Range   Squamous Epithelial / LPF FEW (*) RARE   WBC, UA 3-6  <3 WBC/hpf   RBC / HPF 0-2  <3 RBC/hpf   Bacteria, UA FEW (*) RARE  WET PREP, GENITAL     Status: Abnormal   Collection Time    12/12/13  2:00 PM      Result Value Ref Range   Yeast Wet Prep HPF POC NONE SEEN  NONE SEEN   Trich, Wet Prep NONE SEEN  NONE SEEN   Clue Cells Wet Prep HPF POC NONE SEEN  NONE SEEN   WBC, Wet Prep HPF POC MANY (*) NONE SEEN  CBC     Status: Abnormal   Collection Time    12/12/13  2:22 PM      Result Value Ref Range   WBC 14.5 (*) 4.0 - 10.5 K/uL    RBC 4.69  3.87 - 5.11 MIL/uL   Hemoglobin 11.6 (*) 12.0 - 15.0 g/dL   HCT 40.936.2  81.136.0 - 91.446.0 %   MCV 77.2 (*) 78.0 - 100.0 fL   MCH 24.7 (*) 26.0 - 34.0 pg   MCHC 32.0  30.0 - 36.0 g/dL   RDW 78.217.8 (*) 95.611.5 - 21.315.5 %   Platelets 318  150 - 400 K/uL    Assessment and Plan  abd pain in pregnancy Probable round ligament F/u with [facility]- sooner if increase in pain or bleeding/fever or chills  Jean RosenthalSusan P Arneta Mahmood 12/12/2013, 1:30 PM

## 2013-12-12 NOTE — MAU Provider Note (Signed)
Attestation of Attending Supervision of Advanced Practitioner (PA/CNM/NP): Evaluation and management procedures were performed by the Advanced Practitioner under my supervision and collaboration.  I have reviewed the Advanced Practitioner's note and chart, and I agree with the management and plan.  Reva Boresanya S Corrisa Gibby, MD [facility] Attending 12/12/2013 5:40 PM

## 2013-12-14 LAB — GC/CHLAMYDIA PROBE AMP
CT Probe RNA: NEGATIVE
GC Probe RNA: NEGATIVE

## 2013-12-17 ENCOUNTER — Encounter: Payer: Self-pay | Admitting: Obstetrics & Gynecology

## 2013-12-17 ENCOUNTER — Ambulatory Visit (INDEPENDENT_AMBULATORY_CARE_PROVIDER_SITE_OTHER): Payer: Self-pay | Admitting: Obstetrics & Gynecology

## 2013-12-17 VITALS — BP 100/70 | Wt 147.0 lb

## 2013-12-17 DIAGNOSIS — Z331 Pregnant state, incidental: Secondary | ICD-10-CM

## 2013-12-17 DIAGNOSIS — Z1389 Encounter for screening for other disorder: Secondary | ICD-10-CM

## 2013-12-17 DIAGNOSIS — Z3400 Encounter for supervision of normal first pregnancy, unspecified trimester: Secondary | ICD-10-CM

## 2013-12-17 LAB — POCT URINALYSIS DIPSTICK
Blood, UA: NEGATIVE
GLUCOSE UA: NEGATIVE
Ketones, UA: NEGATIVE
NITRITE UA: NEGATIVE
Protein, UA: NEGATIVE

## 2013-12-17 NOTE — Progress Notes (Signed)
No complaints  No bleeding  Sonogram next visit

## 2013-12-17 NOTE — Addendum Note (Signed)
Addended by: Criss AlvinePULLIAM, CHRYSTAL G on: 12/17/2013 03:13 PM   Modules accepted: Orders

## 2014-01-14 ENCOUNTER — Other Ambulatory Visit: Payer: Self-pay | Admitting: Obstetrics & Gynecology

## 2014-01-14 ENCOUNTER — Ambulatory Visit (INDEPENDENT_AMBULATORY_CARE_PROVIDER_SITE_OTHER): Payer: Self-pay | Admitting: Advanced Practice Midwife

## 2014-01-14 ENCOUNTER — Ambulatory Visit (INDEPENDENT_AMBULATORY_CARE_PROVIDER_SITE_OTHER): Payer: Medicaid Other

## 2014-01-14 ENCOUNTER — Encounter: Payer: Self-pay | Admitting: Advanced Practice Midwife

## 2014-01-14 VITALS — BP 110/64 | Wt 149.0 lb

## 2014-01-14 DIAGNOSIS — Z331 Pregnant state, incidental: Secondary | ICD-10-CM

## 2014-01-14 DIAGNOSIS — Z1389 Encounter for screening for other disorder: Secondary | ICD-10-CM

## 2014-01-14 DIAGNOSIS — Z3400 Encounter for supervision of normal first pregnancy, unspecified trimester: Secondary | ICD-10-CM

## 2014-01-14 LAB — POCT URINALYSIS DIPSTICK
GLUCOSE UA: NEGATIVE
Ketones, UA: NEGATIVE
Leukocytes, UA: NEGATIVE
NITRITE UA: NEGATIVE
Protein, UA: NEGATIVE

## 2014-01-14 NOTE — Progress Notes (Signed)
U/S(18+2wks)-active fetus, meas c/w dates, fluid wnl, posterior Gr 0 placenta, cx appears closed (3.4cm), bilateral adnexa appears wnl, FHR-143 bpm, no major abnl noted, female fetus

## 2014-01-14 NOTE — Progress Notes (Signed)
G1P0 [redacted]w[redacted]d Estimated Date of Delivery: 06/15/14  Blood pressure 110/64, weight 149 lb (67.586 kg), last menstrual period 09/05/2013.   Had anatomy scan today.  All normal results. BP weight and urine results all reviewed and noted.  Please refer to the obstetrical flow sheet for the fundal height and fetal heart rate documentation:  Patient reports good fetal movement, denies any bleeding and no rupture of membranes symptoms or regular contractions. Patient is without complaints. All questions were answered.  Plan:  Continued routine obstetrical care,   Follow up in 4 weeks for OB appointment,

## 2014-02-11 ENCOUNTER — Encounter: Payer: Medicaid Other | Admitting: Advanced Practice Midwife

## 2014-02-17 ENCOUNTER — Ambulatory Visit (INDEPENDENT_AMBULATORY_CARE_PROVIDER_SITE_OTHER): Payer: Self-pay | Admitting: Advanced Practice Midwife

## 2014-02-17 VITALS — BP 108/58 | Wt 151.5 lb

## 2014-02-17 DIAGNOSIS — Z3402 Encounter for supervision of normal first pregnancy, second trimester: Secondary | ICD-10-CM

## 2014-02-17 DIAGNOSIS — Z1389 Encounter for screening for other disorder: Secondary | ICD-10-CM

## 2014-02-17 DIAGNOSIS — Z3400 Encounter for supervision of normal first pregnancy, unspecified trimester: Secondary | ICD-10-CM

## 2014-02-17 DIAGNOSIS — Z331 Pregnant state, incidental: Secondary | ICD-10-CM

## 2014-02-17 LAB — POCT URINALYSIS DIPSTICK
Glucose, UA: NEGATIVE
Ketones, UA: NEGATIVE
Leukocytes, UA: NEGATIVE
Nitrite, UA: NEGATIVE
Protein, UA: NEGATIVE
RBC UA: NEGATIVE

## 2014-02-17 NOTE — Progress Notes (Signed)
G1P0 10647w1d Estimated Date of Delivery: 06/15/14  Blood pressure 108/58, weight 151 lb 8 oz (68.72 kg), last menstrual period 09/05/2013.   BP weight and urine results all reviewed and noted.  Please refer to the obstetrical flow sheet for the fundal height and fetal heart rate documentation:  Patient reports good fetal movement, denies any bleeding and no rupture of membranes symptoms or regular contractions. Patient is without complaints. All questions were answered.  Plan:  Continued routine obstetrical care  Follow up in 4 weeks for OB appointment, PN2    CRESENZO-DISHMAN,FRANCES

## 2014-02-17 NOTE — Patient Instructions (Signed)
1. Before your test, do not eat or drink anything for 8-10 hours prior to your  appointment (a small amount of water is allowed and you may take any medicines you normally take). Be sure to drink lots of water the day before. 2. When you arrive, your blood will be drawn for a 'fasting' blood sugar level.  Then you will be given a sweetened carbonated beverage to drink. You should  complete drinking this beverage within five minutes. After finishing the  beverage, you will have your blood drawn exactly 1 and 2 hours later. Having  your blood drawn on time is an important part of this test. A total of three blood  samples will be done. 3. The test takes approximately 2  hours. During the test, do not have anything to  eat or drink. Do not smoke, chew gum (not even sugarless gum) or use breath mints.  4. During the test you should remain close by and seated as much as possible and  avoid walking around. You may want to bring a book or something else to  occupy your time.  5. After your test, you may eat and drink as normal. You may want to bring a snack  to eat after the test is finished. Your provider will advise you as to the results of  this test and any follow-up if necessary  You will also be retested for syphilis, HIV and blood levels (anemia):  You were already tested in the first trimester, but [facility] recommends retesting.  Additionally, you will be tested for Type 2 Herpes. MOST people do not know that they have genital herpes, as only around 15% of people have outbreaks.  However, it is still transmittable to other people, including the baby (but only during the birth).  If you test positive for Type 2 Herpes, we place you on a medicine called acyclovir the last 6 weeks of your pregnancy to prevent transmission of the virus to the baby during the birth.    If your sugar test is positive for gestational diabetes, you will be given an phone call and further instructions discussed.   We typically do not call patients with positive herpes results, but will discuss it at your next appointment.  If you wish to know all of your test results before your next appointment, feel free to call the office, or look up your test results on Mychart.  (The range that the lab uses for normal values of the sugar test are not necessarily the range that is used for pregnant women; if your results are within the range, they are definitely normal.  However, if a value is deemed "high" by the lab, it may not be too high for a pregnant woman.  We will need to discuss the normal range if your value(s) fall in the "high" category).     Sometime between 27 and 36 weeks, it is recommended that you and anyone who is going to be in close contact with your baby receive the Tdap booster.  You should receive it EACH pregnancy, regardless of when your last booster was.  You may go to the [facility] Department (no appointment necessary) or your Primary Care office to receive the vaccine.  If you do not receive the vaccine prior to delivery, it will be offered in the hospital.  However, if you get it at least 2 weeks prior to delivery, you will have the added advantage of passing the immunity to your baby.   

## 2014-03-04 ENCOUNTER — Telehealth: Payer: Self-pay | Admitting: Advanced Practice Midwife

## 2014-03-04 DIAGNOSIS — Z3490 Encounter for supervision of normal pregnancy, unspecified, unspecified trimester: Secondary | ICD-10-CM

## 2014-03-04 NOTE — Telephone Encounter (Signed)
Pt states that she does feel a little better, she is still having some cramping but not as much. I advised the pt that her work note will be in the front office for her to pick up. I also advised the pt that I had spoke with Drenda FreezeFran and that she advised that the spotting and cramping were more than likely related to sex. I advised the pt that she should wait at least a week before she has sex again, pelvic rest for at least 7 days. Pt was also advised that if she started having severe pain or heavy [facility] The pt verbalized understanding to all the above.   Pt to come by office and pick up note.

## 2014-03-04 NOTE — Telephone Encounter (Signed)
Pt states that she was having some bleeding last night and having some cramping this morning since about 4 this morning. Pt states that she had some spotting last night. Pt had sex and then had some red spotting. Pt denies any spotting today. Pt states that the cramping has gotten better as the day progressed. Pt also wanted a work note for today.  Pt was advised to push fluids and rest and call us back at 3 this afternoon and let us know how she is feeling. The pt verbalized understanding.  I spoke with Drenda FreezeFran and she advised that it all sounds sex related and that it should get better, and that she could have a work note for today.

## 2014-03-17 ENCOUNTER — Ambulatory Visit (INDEPENDENT_AMBULATORY_CARE_PROVIDER_SITE_OTHER): Payer: Self-pay | Admitting: Advanced Practice Midwife

## 2014-03-17 ENCOUNTER — Other Ambulatory Visit: Payer: Medicaid Other

## 2014-03-17 ENCOUNTER — Other Ambulatory Visit: Payer: Self-pay | Admitting: Advanced Practice Midwife

## 2014-03-17 ENCOUNTER — Ambulatory Visit (INDEPENDENT_AMBULATORY_CARE_PROVIDER_SITE_OTHER): Payer: Medicaid Other

## 2014-03-17 ENCOUNTER — Encounter: Payer: Self-pay | Admitting: Advanced Practice Midwife

## 2014-03-17 VITALS — BP 110/80 | Wt 154.0 lb

## 2014-03-17 DIAGNOSIS — Z3402 Encounter for supervision of normal first pregnancy, second trimester: Secondary | ICD-10-CM

## 2014-03-17 DIAGNOSIS — Z3400 Encounter for supervision of normal first pregnancy, unspecified trimester: Secondary | ICD-10-CM

## 2014-03-17 DIAGNOSIS — Z131 Encounter for screening for diabetes mellitus: Secondary | ICD-10-CM

## 2014-03-17 DIAGNOSIS — Z0184 Encounter for antibody response examination: Secondary | ICD-10-CM

## 2014-03-17 DIAGNOSIS — O26843 Uterine size-date discrepancy, third trimester: Secondary | ICD-10-CM

## 2014-03-17 DIAGNOSIS — Z3490 Encounter for supervision of normal pregnancy, unspecified, unspecified trimester: Secondary | ICD-10-CM

## 2014-03-17 DIAGNOSIS — Z3403 Encounter for supervision of normal first pregnancy, third trimester: Secondary | ICD-10-CM

## 2014-03-17 DIAGNOSIS — Z1159 Encounter for screening for other viral diseases: Secondary | ICD-10-CM

## 2014-03-17 DIAGNOSIS — Z1389 Encounter for screening for other disorder: Secondary | ICD-10-CM

## 2014-03-17 DIAGNOSIS — O26849 Uterine size-date discrepancy, unspecified trimester: Secondary | ICD-10-CM

## 2014-03-17 DIAGNOSIS — Z331 Pregnant state, incidental: Secondary | ICD-10-CM

## 2014-03-17 LAB — CBC
HCT: 31.5 % — ABNORMAL LOW (ref 36.0–46.0)
Hemoglobin: 10.3 g/dL — ABNORMAL LOW (ref 12.0–15.0)
MCH: 25.1 pg — ABNORMAL LOW (ref 26.0–34.0)
MCHC: 32.7 g/dL (ref 30.0–36.0)
MCV: 76.6 fL — AB (ref 78.0–100.0)
PLATELETS: 389 10*3/uL (ref 150–400)
RBC: 4.11 MIL/uL (ref 3.87–5.11)
RDW: 15.8 % — AB (ref 11.5–15.5)
WBC: 12.4 10*3/uL — AB (ref 4.0–10.5)

## 2014-03-17 LAB — POCT URINALYSIS DIPSTICK
Blood, UA: NEGATIVE
GLUCOSE UA: NEGATIVE
Ketones, UA: NEGATIVE
LEUKOCYTES UA: NEGATIVE
NITRITE UA: NEGATIVE
Protein, UA: NEGATIVE

## 2014-03-17 NOTE — Progress Notes (Signed)
U/S(27+1wks)-vtx active fetus, approp growth EFW 2 lb 5 oz (54th%tile), fluid WNL AFI-11.1cm SDP-4.0cm, FHR-135 bpm, cx appears closed (3.3cm), female fetus, posterior Gr 1 placenta

## 2014-03-17 NOTE — Addendum Note (Signed)
Addended by: Jacklyn ShellRESENZO-DISHMON, Punam Broussard on: 03/17/2014 09:52 AM   Modules accepted: Orders

## 2014-03-17 NOTE — Progress Notes (Addendum)
G1P0 6535w1d Estimated Date of Delivery: 06/15/14  Blood pressure 110/80, weight 154 lb (69.854 kg), last menstrual period 09/05/2013.   BP weight and urine results all reviewed and noted.  Please refer to the obstetrical flow sheet for the fundal height and fetal heart rate documentation: FH at U +1 ? Position??  Patient reports good fetal movement, denies any bleeding and no rupture of membranes symptoms or regular contractions. Patient is without complaints. All questions were answered.  Plan:  Continued routine obstetrical care, PN2 today.  Come back this afternoon for growth us/AFI  Follow up in 4 weeks for OB appointment,

## 2014-03-18 LAB — GLUCOSE TOLERANCE, 2 HOURS W/ 1HR
GLUCOSE, 2 HOUR: 72 mg/dL (ref 70–139)
Glucose, 1 hour: 85 mg/dL (ref 70–170)
Glucose, Fasting: 82 mg/dL (ref 70–99)

## 2014-03-18 LAB — ANTIBODY SCREEN: ANTIBODY SCREEN: NEGATIVE

## 2014-03-18 LAB — HSV 2 ANTIBODY, IGG: HSV 2 Glycoprotein G Ab, IgG: <0.1 IV

## 2014-03-18 LAB — HIV ANTIBODY (ROUTINE TESTING W REFLEX): HIV 1&2 Ab, 4th Generation: NONREACTIVE

## 2014-03-18 LAB — RPR

## 2014-03-21 ENCOUNTER — Inpatient Hospital Stay (HOSPITAL_COMMUNITY)
Admission: AD | Admit: 2014-03-21 | Discharge: 2014-03-22 | Disposition: A | Discharge status: Home / Self Care | Payer: Medicaid Other | Source: Ambulatory Visit | Attending: Obstetrics & Gynecology | Admitting: Obstetrics & Gynecology

## 2014-03-21 ENCOUNTER — Encounter (HOSPITAL_COMMUNITY): Payer: Self-pay

## 2014-03-21 DIAGNOSIS — O4690 Antepartum hemorrhage, unspecified, unspecified trimester: Secondary | ICD-10-CM | POA: Insufficient documentation

## 2014-03-21 DIAGNOSIS — Z3490 Encounter for supervision of normal pregnancy, unspecified, unspecified trimester: Secondary | ICD-10-CM

## 2014-03-21 LAB — URINALYSIS, ROUTINE W REFLEX MICROSCOPIC
Bilirubin Urine: NEGATIVE
Glucose, UA: NEGATIVE mg/dL
HGB URINE DIPSTICK: NEGATIVE
Ketones, ur: NEGATIVE mg/dL
Leukocytes, UA: NEGATIVE
NITRITE: NEGATIVE
PROTEIN: NEGATIVE mg/dL
SPECIFIC GRAVITY, URINE: 1.01 (ref 1.005–1.030)
UROBILINOGEN UA: 0.2 mg/dL (ref 0.0–1.0)
pH: 8.5 — ABNORMAL HIGH (ref 5.0–8.0)

## 2014-03-21 LAB — WET PREP, GENITAL
Clue Cells Wet Prep HPF POC: NONE SEEN
TRICH WET PREP: NONE SEEN
Yeast Wet Prep HPF POC: NONE SEEN

## 2014-03-21 NOTE — MAU Note (Signed)
Pt reports vaginal bleeding and pressure. First noticed the bleeding at 1900, on tissue when she wipes.

## 2014-03-21 NOTE — MAU Provider Note (Signed)
Chief Complaint:  Vaginal Bleeding   Melanie Price is a 23 y.o.  G1P0 with IUP at [redacted]w[redacted]d presenting for Vaginal Bleeding She report vaginal blood "spotting" that occurred ~ 7pm tonight. She noticed the blood while wiping. She denies any LOF, CTX or abdominal pain and noted good fetal movement. She denies any truama or recent sexual intercourse. She denies any vaginal pain, itching or abnormal discharge. She has not had any complications with this pregnancy and take no medications other than PNV.   Menstrual History: OB History   Grav Para Term Preterm Abortions TAB SAB Ect Mult Living   1              Patient's last menstrual period was 09/05/2013.      Past Medical History  Diagnosis Date  . Medical history non-contributory     Past Surgical History  Procedure Laterality Date  . No past surgeries      Family History  Problem Relation Age of Onset  . Cancer Maternal Grandfather     skin    History  Substance Use Topics  . Smoking status: Never Smoker   . Smokeless tobacco: Never Used  . Alcohol Use: No     No Known Allergies  Prescriptions prior to admission  Medication Sig Dispense Refill  . acetaminophen (TYLENOL) 325 MG tablet Take 650 mg by mouth every 6 (six) hours as needed for headache.      . Prenatal Vit-Min-FA-Fish Oil ([facility] PRENATAL GUMMY PO) Take 2 tablets by mouth daily.        Review of Systems - Negative except for what is mentioned in HPI.  Physical Exam  Blood pressure 111/40, pulse 110, temperature 98.1 F (36.7 C), temperature source Oral, resp. rate 18, height 5\' 7"  (1.702 m), weight 69.854 kg (154 lb), last menstrual period 09/05/2013, SpO2 98.00%. GENERAL: Well-developed, well-nourished female in no acute distress.  ABDOMEN: Soft, nontender, nondistended, gravid.  EXTREMITIES: Nontender, no edema, 2+ distal pulses. FHT:  Baseline rate 150 bpm   Variability moderate  Accelerations absent   Decelerations none Contractions: None Dilation:  Closed Exam by:: Dr Gayla DossJoyner Speculum Exam: Ext genitalia: wnl; Vaginal discharge: minimal clear w/o blood; Cervix: closed and hyperemic. No vaginal mucosa tears noted   Labs: Results for orders placed during the hospital encounter of 03/21/14 (from the past 24 hour(s))  URINALYSIS, ROUTINE W REFLEX MICROSCOPIC   Collection Time    03/21/14 10:30 PM      Result Value Ref Range   Color, Urine YELLOW  YELLOW   APPearance CLOUDY (*) CLEAR   Specific Gravity, Urine 1.010  1.005 - 1.030   pH 8.5 (*) 5.0 - 8.0   Glucose, UA NEGATIVE  NEGATIVE mg/dL   Hgb urine dipstick NEGATIVE  NEGATIVE   Bilirubin Urine NEGATIVE  NEGATIVE   Ketones, ur NEGATIVE  NEGATIVE mg/dL   Protein, ur NEGATIVE  NEGATIVE mg/dL   Urobilinogen, UA 0.2  0.0 - 1.0 mg/dL   Nitrite NEGATIVE  NEGATIVE   Leukocytes, UA NEGATIVE  NEGATIVE   Imaging Studies:  [place] [facility] Follow Up  03/18/2014   FOLLOW UP SONOGRAM  TECHNICIAN COMMENTS:  U/S(27+1wks)-vtx active fetus, approp growth EFW 2 lb 5 oz (54th%tile),  fluid WNL AFI-11.1cm SDP-4.0cm, FHR-135 bpm, cx appears closed (3.3cm),  female fetus, posterior Gr 1 placenta     Assessment: Melanie Price is  23 y.o. G1P0 at 837w5d presents with Vaginal Bleeding No ctx or abdominal pain; Cervix closed without bleeding on speculum exam-  Ectropion noted. Wet negative for infection.   Plan: Discharge home with f/u at her next routine OB visit. If bleeding persist may need repeat speculum exam for reevaluation of ectropion with possible biopsy.     Wenda Low 8/16/201511:28 PM

## 2014-03-22 NOTE — Discharge Instructions (Signed)
Your bleeding is likely caused by Ectropion which is a normal finding in pregnancy. Your babies heart rate is strong. [facility]. [facility] if you develop persistent vaginal bleeding, bleeding with abdominal pain or decreased fetal movement.   Vaginal Bleeding During Pregnancy, Second Trimester A small amount of bleeding (spotting) from the vagina is relatively common in pregnancy. It usually stops on its own. Various things can cause bleeding or spotting in pregnancy. Some bleeding may be related to the pregnancy, and some may not. Sometimes the bleeding is normal and is not a problem. However, bleeding can also be a sign of something serious. Be sure to tell your health care provider about any vaginal bleeding right away. Some possible causes of vaginal bleeding during the second trimester include:  Infection, inflammation, or growths on the cervix.   The placenta may be partially or completely covering the opening of the cervix inside the uterus (placenta previa).  The placenta may have separated from the uterus (abruption of the placenta).   You may be having early (preterm) labor.   The cervix may not be strong enough to keep a baby inside the uterus (cervical insufficiency).   Tiny cysts may have developed in the uterus instead of pregnancy tissue (molar pregnancy). HOME CARE INSTRUCTIONS  Watch your condition for any changes. The following actions may help to lessen any discomfort you are feeling:  Follow your health care provider's instructions for limiting your activity. If your health care provider orders bed rest, you may need to stay in bed and only get up to use the bathroom. However, your health care provider may allow you to continue light activity.  If needed, make plans for someone to help with your regular activities and responsibilities while you are on bed rest.  Keep track of the number of pads you use each  day, how often you change pads, and how soaked (saturated) they are. Write this down.  Do not use tampons. Do not douche.  Do not have sexual intercourse or orgasms until approved by your health care provider.  If you pass any tissue from your vagina, save the tissue so you can show it to your health care provider.  Only take over-the-counter or prescription medicines as directed by your health care provider.  Do not take aspirin because it can make you bleed.  Do not exercise or perform any strenuous activities or heavy lifting without your health care provider's permission.  Keep all follow-up appointments as directed by your health care provider. SEEK MEDICAL CARE IF:  You have any vaginal bleeding during any part of your pregnancy.  You have cramps or labor pains.  You have a fever, not controlled by medicine. SEEK IMMEDIATE MEDICAL CARE IF:   You have severe cramps in your back or belly (abdomen).  You have contractions.  You have chills.  You pass large clots or tissue from your vagina.  Your bleeding increases.  You feel light-headed or weak, or you have fainting episodes.  You are leaking fluid or have a gush of fluid from your vagina. MAKE SURE YOU:  Understand these instructions.  Will watch your condition.  Will get help right away if you are not doing well or get worse. Document Released: 05/02/2005 Document Revised: 07/28/2013 Document Reviewed: 03/30/2013 [facility] Patient Information 2015 [place], [place]. This information is not intended to replace advice given to you by your health care provider. Make sure you discuss any  questions you have with your health care provider.

## 2014-03-22 NOTE — MAU Provider Note (Signed)
Agree with assessment. 

## 2014-04-14 ENCOUNTER — Encounter: Payer: Self-pay | Admitting: Women's Health

## 2014-04-14 ENCOUNTER — Ambulatory Visit (INDEPENDENT_AMBULATORY_CARE_PROVIDER_SITE_OTHER): Payer: Medicaid Other | Admitting: Women's Health

## 2014-04-14 VITALS — BP 100/80 | Wt 155.0 lb

## 2014-04-14 DIAGNOSIS — Z3403 Encounter for supervision of normal first pregnancy, third trimester: Secondary | ICD-10-CM

## 2014-04-14 DIAGNOSIS — O99019 Anemia complicating pregnancy, unspecified trimester: Secondary | ICD-10-CM

## 2014-04-14 DIAGNOSIS — Z331 Pregnant state, incidental: Secondary | ICD-10-CM

## 2014-04-14 DIAGNOSIS — Z1389 Encounter for screening for other disorder: Secondary | ICD-10-CM

## 2014-04-14 DIAGNOSIS — Z3400 Encounter for supervision of normal first pregnancy, unspecified trimester: Secondary | ICD-10-CM

## 2014-04-14 LAB — POCT URINALYSIS DIPSTICK
Blood, UA: NEGATIVE
Glucose, UA: NEGATIVE
Ketones, UA: NEGATIVE
LEUKOCYTES UA: NEGATIVE
Nitrite, UA: NEGATIVE
PROTEIN UA: NEGATIVE

## 2014-04-14 MED ORDER — FUSION PLUS PO CAPS: 1.0000 | ORAL_CAPSULE | ORAL | Status: DC

## 2014-04-14 NOTE — Addendum Note (Signed)
Addended by: Gaylyn Rong A on: 04/14/2014 11:32 AM   Modules accepted: Orders

## 2014-04-14 NOTE — Progress Notes (Signed)
Low-risk OB appointment G1P0 [redacted]w[redacted]d Estimated Date of Delivery: 06/15/14 BP 100/80  Wt 155 lb (70.308 kg)  BP, weight, and urine reviewed.  Refer to obstetrical flow sheet for FH & FHR.  Reports good fm.  Denies regular uc's, lof, vb, or uti s/s. No complaints. Hgb 10.3 on pn2, rx fusion plus, increase fe-rich foods Hasn't made it to cb classes, recommended tour Reviewed ptl s/s, fkc. Plan:  Continue routine obstetrical care  F/U in 2wks for OB appointment

## 2014-04-14 NOTE — Patient Instructions (Signed)
[place] Pediatricians:  [facility] [phone]            [facility] [phone]                 [facility] [phone] (usually doesn't accept new patients unless you have family there already, you are always welcome to call and ask)             [facility] ([address] [place]) [phone]   [place] Pediatricians:   [facility]: [phone]  [facility]: [phone]   Call the office ([phone]) or go to [facility] if:  You begin to have strong, frequent contractions  Your water breaks.  Sometimes it is a big gush of fluid, sometimes it is just a trickle that keeps getting your panties wet or running down your legs  You have vaginal bleeding.  It is normal to have a small amount of spotting if your cervix was checked.   You don't feel your baby moving like normal.  If you don't, get you something to eat and drink and lay down and focus on feeling your baby move.  You should feel at least 10 movements in 2 hours.  If you don't, you should call the office or go to [facility].    Third Trimester of Pregnancy The third trimester is from week 29 through week 42, months 7 through 9. The third trimester is a time when the fetus is growing rapidly. At the end of the ninth month, the fetus is about 20 inches in length and weighs 6-10 pounds.  BODY CHANGES Your body goes through many changes during pregnancy. The changes vary from woman to woman.   Your weight will continue to increase. You can expect to gain 25-35 pounds (11-16 kg) by the end of the pregnancy.  You may begin to get stretch marks on your hips, abdomen, and breasts.  You may urinate more often because the fetus is moving lower into your pelvis and pressing on your bladder.  You may develop or continue to have heartburn as a result of your pregnancy.  You may develop constipation because certain  hormones are causing the muscles that push waste through your intestines to slow down.  You may develop hemorrhoids or swollen, bulging veins (varicose veins).  You may have pelvic pain because of the weight gain and pregnancy hormones relaxing your joints between the bones in your pelvis. Backaches may result from overexertion of the muscles supporting your posture.  You may have changes in your hair. These can include thickening of your hair, rapid growth, and changes in texture. Some women also have hair loss during or after pregnancy, or hair that feels dry or thin. Your hair will most likely return to normal after your baby is born.  Your breasts will continue to grow and be tender. A yellow discharge may leak from your breasts called colostrum.  Your belly button may stick out.  You may feel short of breath because of your expanding uterus.  You may notice the fetus "dropping," or moving lower in your abdomen.  You may have a bloody mucus discharge. This usually occurs a few days to a week before labor begins.  Your cervix becomes thin and soft (effaced) near your due date. WHAT TO EXPECT AT YOUR PRENATAL EXAMS  You will have prenatal exams every 2 weeks until week 36. Then, you will have weekly prenatal exams. During a routine prenatal   visit:  You will be weighed to make sure you and the fetus are growing normally.  Your blood pressure is taken.  Your abdomen will be measured to track your baby's growth.  The fetal heartbeat will be listened to.  Any test results from the previous visit will be discussed.  You may have a cervical check near your due date to see if you have effaced. At around 36 weeks, your caregiver will check your cervix. At the same time, your caregiver will also perform a test on the secretions of the vaginal tissue. This test is to determine if a type of bacteria, Group B streptococcus, is present. Your caregiver will explain this further. Your caregiver  may ask you:  What your birth plan is.  How you are feeling.  If you are feeling the baby move.  If you have had any abnormal symptoms, such as leaking fluid, bleeding, severe headaches, or abdominal cramping.  If you have any questions. Other tests or screenings that may be performed during your third trimester include:  Blood tests that check for low iron levels (anemia).  Fetal testing to check the health, activity level, and growth of the fetus. Testing is done if you have certain medical conditions or if there are problems during the pregnancy. FALSE LABOR You may feel small, irregular contractions that eventually go away. These are called Braxton Hicks contractions, or false labor. Contractions may last for hours, days, or even weeks before true labor sets in. If contractions come at regular intervals, intensify, or become painful, it is best to be seen by your caregiver.  SIGNS OF LABOR   Menstrual-like cramps.  Contractions that are 5 minutes apart or less.  Contractions that start on the top of the uterus and spread down to the lower abdomen and back.  A sense of increased pelvic pressure or back pain.  A watery or bloody mucus discharge that comes from the vagina. If you have any of these signs before the 37th week of pregnancy, call your caregiver right away. You need to go to the hospital to get checked immediately. HOME CARE INSTRUCTIONS   Avoid all smoking, herbs, alcohol, and unprescribed drugs. These chemicals affect the formation and growth of the baby.  Follow your caregiver's instructions regarding medicine use. There are medicines that are either safe or unsafe to take during pregnancy.  Exercise only as directed by your caregiver. Experiencing uterine cramps is a good sign to stop exercising.  Continue to eat regular, healthy meals.  Wear a good support bra for breast tenderness.  Do not use hot tubs, steam rooms, or saunas.  Wear your seat belt at all  times when driving.  Avoid raw meat, uncooked cheese, cat litter boxes, and soil used by cats. These carry germs that can cause birth defects in the baby.  Take your prenatal vitamins.  Try taking a stool softener (if your caregiver approves) if you develop constipation. Eat more high-fiber foods, such as fresh vegetables or fruit and whole grains. Drink plenty of fluids to keep your urine clear or pale yellow.  Take warm sitz baths to soothe any pain or discomfort caused by hemorrhoids. Use hemorrhoid cream if your caregiver approves.  If you develop varicose veins, wear support hose. Elevate your feet for 15 minutes, 3-4 times a day. Limit salt in your diet.  Avoid heavy lifting, wear low heal shoes, and practice good posture.  Rest a lot with your legs elevated if you have leg cramps   or low back pain.  Visit your dentist if you have not gone during your pregnancy. Use a soft toothbrush to brush your teeth and be gentle when you floss.  A sexual relationship may be continued unless your caregiver directs you otherwise.  Do not travel far distances unless it is absolutely necessary and only with the approval of your caregiver.  Take prenatal classes to understand, practice, and ask questions about the labor and delivery.  Make a trial run to the hospital.  Pack your hospital bag.  Prepare the baby's nursery.  Continue to go to all your prenatal visits as directed by your caregiver. SEEK MEDICAL CARE IF:  You are unsure if you are in labor or if your water has broken.  You have dizziness.  You have mild pelvic cramps, pelvic pressure, or nagging pain in your abdominal area.  You have persistent nausea, vomiting, or diarrhea.  You have a bad smelling vaginal discharge.  You have pain with urination. SEEK IMMEDIATE MEDICAL CARE IF:   You have a fever.  You are leaking fluid from your vagina.  You have spotting or bleeding from your vagina.  You have severe abdominal  cramping or pain.  You have rapid weight loss or gain.  You have shortness of breath with chest pain.  You notice sudden or extreme swelling of your face, hands, ankles, feet, or legs.  You have not felt your baby move in over an hour.  You have severe headaches that do not go away with medicine.  You have vision changes. Document Released: 07/17/2001 Document Revised: 07/28/2013 Document Reviewed: 09/23/2012 ExitCare Patient Information 2015 [employer]. This information is not intended to replace advice given to you by your health care provider. Make sure you discuss any questions you have with your health care provider.  

## 2014-04-28 ENCOUNTER — Encounter: Payer: Self-pay | Admitting: *Deleted

## 2014-04-28 ENCOUNTER — Encounter: Payer: Medicaid Other | Admitting: Advanced Practice Midwife

## 2014-04-29 ENCOUNTER — Ambulatory Visit (INDEPENDENT_AMBULATORY_CARE_PROVIDER_SITE_OTHER): Payer: Medicaid Other | Admitting: Obstetrics & Gynecology

## 2014-04-29 VITALS — BP 118/80 | Wt 157.8 lb

## 2014-04-29 DIAGNOSIS — Z3400 Encounter for supervision of normal first pregnancy, unspecified trimester: Secondary | ICD-10-CM

## 2014-04-29 DIAGNOSIS — Z331 Pregnant state, incidental: Secondary | ICD-10-CM

## 2014-04-29 DIAGNOSIS — Z1389 Encounter for screening for other disorder: Secondary | ICD-10-CM

## 2014-04-29 LAB — POCT URINALYSIS DIPSTICK
Blood, UA: NEGATIVE
GLUCOSE UA: NEGATIVE
KETONES UA: NEGATIVE
NITRITE UA: NEGATIVE
Protein, UA: NEGATIVE

## 2014-04-29 NOTE — Progress Notes (Signed)
FH with a little lag, will do sonogram next visit  G1P0 [redacted]w[redacted]d Estimated Date of Delivery: 06/15/14  Blood pressure 118/80, weight 157 lb 12 oz (71.555 kg).   BP weight and urine results all reviewed and noted.  Please refer to the obstetrical flow sheet for the fundal height and fetal heart rate documentation:  Patient reports good fetal movement, denies any bleeding and no rupture of membranes symptoms or regular contractions. Patient is without complaints. All questions were answered.  Plan:  Continued routine obstetrical care,   Follow up in 2 weeks for OB appointment, sonogram

## 2014-05-13 ENCOUNTER — Encounter: Payer: Self-pay | Admitting: Advanced Practice Midwife

## 2014-05-13 ENCOUNTER — Ambulatory Visit (INDEPENDENT_AMBULATORY_CARE_PROVIDER_SITE_OTHER): Payer: Medicaid Other

## 2014-05-13 ENCOUNTER — Other Ambulatory Visit: Payer: Self-pay | Admitting: Obstetrics & Gynecology

## 2014-05-13 ENCOUNTER — Ambulatory Visit (INDEPENDENT_AMBULATORY_CARE_PROVIDER_SITE_OTHER): Payer: Medicaid Other | Admitting: Advanced Practice Midwife

## 2014-05-13 VITALS — BP 100/60 | Wt 162.0 lb

## 2014-05-13 DIAGNOSIS — Z1389 Encounter for screening for other disorder: Secondary | ICD-10-CM

## 2014-05-13 DIAGNOSIS — Z331 Pregnant state, incidental: Secondary | ICD-10-CM

## 2014-05-13 DIAGNOSIS — Z3403 Encounter for supervision of normal first pregnancy, third trimester: Secondary | ICD-10-CM

## 2014-05-13 DIAGNOSIS — O365931 Maternal care for other known or suspected poor fetal growth, third trimester, fetus 1: Secondary | ICD-10-CM

## 2014-05-13 LAB — POCT URINALYSIS DIPSTICK
Blood, UA: NEGATIVE
GLUCOSE UA: NEGATIVE
Ketones, UA: NEGATIVE
LEUKOCYTES UA: NEGATIVE
NITRITE UA: NEGATIVE
Protein, UA: NEGATIVE

## 2014-05-13 NOTE — Progress Notes (Signed)
U/S(35+2wks)-vtx active fetus, EFw 5 lb (20th%tile), fluid WNL AFI-13.8cm SDP-5.7cm, FHR-136 bpm, posterior Gr 1 placenta, female fetus

## 2014-05-13 NOTE — Progress Notes (Signed)
G1P0 3169w2d Estimated Date of Delivery: 06/15/14  Blood pressure 100/60, weight 162 lb (73.483 kg).   BP weight and urine results all reviewed and noted.  Please refer to the obstetrical flow sheet for the fundal height and fetal heart rate documentation: Had  For size<dates: (35+2wks)-vtx active fetus, EFw 5 lb (20th%tile), fluid WNL AFI-13.8cm SDP-5.7cm, FHR-136 bpm, posterior Gr 1 placenta, female fetus  Patient reports good fetal movement, denies any bleeding and no rupture of membranes symptoms or regular contractions. Patient is without complaints. All questions were answered.  Plan:  Continued routine obstetrical care,   Follow up in 2 weeks for OB appointment,

## 2014-05-13 NOTE — Progress Notes (Signed)
Pt denies any problems or concerns at this time.  

## 2014-05-27 ENCOUNTER — Ambulatory Visit (INDEPENDENT_AMBULATORY_CARE_PROVIDER_SITE_OTHER): Payer: Medicaid Other | Admitting: Advanced Practice Midwife

## 2014-05-27 ENCOUNTER — Encounter: Payer: Self-pay | Admitting: Advanced Practice Midwife

## 2014-05-27 VITALS — BP 90/60 | Wt 164.0 lb

## 2014-05-27 DIAGNOSIS — Z1389 Encounter for screening for other disorder: Secondary | ICD-10-CM

## 2014-05-27 DIAGNOSIS — Z331 Pregnant state, incidental: Secondary | ICD-10-CM

## 2014-05-27 DIAGNOSIS — Z118 Encounter for screening for other infectious and parasitic diseases: Secondary | ICD-10-CM

## 2014-05-27 DIAGNOSIS — Z3685 Encounter for antenatal screening for Streptococcus B: Secondary | ICD-10-CM

## 2014-05-27 DIAGNOSIS — Z3403 Encounter for supervision of normal first pregnancy, third trimester: Secondary | ICD-10-CM

## 2014-05-27 DIAGNOSIS — Z1159 Encounter for screening for other viral diseases: Secondary | ICD-10-CM

## 2014-05-27 LAB — POCT URINALYSIS DIPSTICK
Blood, UA: NEGATIVE
Glucose, UA: NEGATIVE
Ketones, UA: NEGATIVE
Leukocytes, UA: NEGATIVE
NITRITE UA: NEGATIVE
PROTEIN UA: NEGATIVE

## 2014-05-27 LAB — OB RESULTS CONSOLE GC/CHLAMYDIA
Chlamydia: NEGATIVE
Gonorrhea: NEGATIVE

## 2014-05-27 NOTE — Progress Notes (Signed)
Pt denies any problems or concerns at this time.  

## 2014-05-27 NOTE — Progress Notes (Signed)
G1P0 4246w2d Estimated Date of Delivery: 06/15/14  There were no vitals taken for this visit.   BP weight and urine results all reviewed and noted.  Please refer to the obstetrical flow sheet for the fundal height and fetal heart rate documentation:  Patient reports good fetal movement, denies any bleeding and no rupture of membranes symptoms or regular contractions. Patient is without complaints. All questions were answered.  Plan:  Continued routine obstetrical care, GBS collected today  Follow up in 1 weeks for OB appointment,

## 2014-05-28 LAB — STREP B DNA PROBE: GBSP: DETECTED

## 2014-05-29 LAB — GC/CHLAMYDIA PROBE AMP
CT PROBE, AMP APTIMA: NEGATIVE
GC PROBE AMP APTIMA: NEGATIVE

## 2014-06-02 ENCOUNTER — Encounter: Payer: Self-pay | Admitting: Advanced Practice Midwife

## 2014-06-03 ENCOUNTER — Encounter: Payer: Self-pay | Admitting: Obstetrics & Gynecology

## 2014-06-03 ENCOUNTER — Ambulatory Visit (INDEPENDENT_AMBULATORY_CARE_PROVIDER_SITE_OTHER): Payer: Medicaid Other | Admitting: Obstetrics & Gynecology

## 2014-06-03 VITALS — BP 134/90 | Wt 168.0 lb

## 2014-06-03 DIAGNOSIS — Z3403 Encounter for supervision of normal first pregnancy, third trimester: Secondary | ICD-10-CM

## 2014-06-03 DIAGNOSIS — Z1389 Encounter for screening for other disorder: Secondary | ICD-10-CM

## 2014-06-03 DIAGNOSIS — Z331 Pregnant state, incidental: Secondary | ICD-10-CM

## 2014-06-03 LAB — POCT URINALYSIS DIPSTICK
Glucose, UA: NEGATIVE
KETONES UA: NEGATIVE
Nitrite, UA: NEGATIVE
PROTEIN UA: NEGATIVE
RBC UA: NEGATIVE

## 2014-06-03 NOTE — Progress Notes (Signed)
BP up just a bit, has several diastolics of 80  G1P0 7010w2d Estimated Date of Delivery: 06/15/14  Blood pressure 134/90, weight 168 lb (76.204 kg).   BP weight and urine results all reviewed and noted.  Please refer to the obstetrical flow sheet for the fundal height and fetal heart rate documentation:  Patient reports good fetal movement, denies any bleeding and no rupture of membranes symptoms or regular contractions. Patient is without complaints. All questions were answered.  Plan:  Continued routine obstetrical care,   Follow up in Monday weeks for OB appointment, close follow up

## 2014-06-07 ENCOUNTER — Ambulatory Visit (INDEPENDENT_AMBULATORY_CARE_PROVIDER_SITE_OTHER): Payer: Medicaid Other | Admitting: Obstetrics & Gynecology

## 2014-06-07 ENCOUNTER — Encounter: Payer: Self-pay | Admitting: Obstetrics & Gynecology

## 2014-06-07 VITALS — BP 120/80 | Wt 168.0 lb

## 2014-06-07 DIAGNOSIS — Z1389 Encounter for screening for other disorder: Secondary | ICD-10-CM

## 2014-06-07 DIAGNOSIS — Z3403 Encounter for supervision of normal first pregnancy, third trimester: Secondary | ICD-10-CM

## 2014-06-07 DIAGNOSIS — Z331 Pregnant state, incidental: Secondary | ICD-10-CM

## 2014-06-07 LAB — POCT URINALYSIS DIPSTICK
Blood, UA: NEGATIVE
Glucose, UA: NEGATIVE
KETONES UA: NEGATIVE
LEUKOCYTES UA: NEGATIVE
Nitrite, UA: NEGATIVE
Protein, UA: NEGATIVE

## 2014-06-07 NOTE — Addendum Note (Signed)
Addended by: Colen DarlingYOUNG, Lissete Maestas S on: 06/07/2014 03:39 PM   Modules accepted: Orders

## 2014-06-07 NOTE — Progress Notes (Signed)
G1P0 2398w6d Estimated Date of Delivery: 06/15/14  Blood pressure 120/80, weight 168 lb (76.204 kg).   BP weight and urine results all reviewed and noted.  Please refer to the obstetrical flow sheet for the fundal height and fetal heart rate documentation:  Patient reports good fetal movement, denies any bleeding and no rupture of membranes symptoms or regular contractions. Patient is without complaints. All questions were answered.  Plan:  Continued routine obstetrical care,   Follow up in 1 weeks for OB appointment, routine

## 2014-06-14 ENCOUNTER — Ambulatory Visit (INDEPENDENT_AMBULATORY_CARE_PROVIDER_SITE_OTHER): Payer: Medicaid Other | Admitting: Women's Health

## 2014-06-14 ENCOUNTER — Encounter: Payer: Self-pay | Admitting: Women's Health

## 2014-06-14 VITALS — BP 120/68 | Wt 167.0 lb

## 2014-06-14 DIAGNOSIS — Z331 Pregnant state, incidental: Secondary | ICD-10-CM

## 2014-06-14 DIAGNOSIS — Z3403 Encounter for supervision of normal first pregnancy, third trimester: Secondary | ICD-10-CM

## 2014-06-14 DIAGNOSIS — Z1389 Encounter for screening for other disorder: Secondary | ICD-10-CM

## 2014-06-14 DIAGNOSIS — O26843 Uterine size-date discrepancy, third trimester: Secondary | ICD-10-CM

## 2014-06-14 LAB — POCT URINALYSIS DIPSTICK
Blood, UA: NEGATIVE
GLUCOSE UA: NEGATIVE
KETONES UA: NEGATIVE
NITRITE UA: NEGATIVE
Protein, UA: NEGATIVE

## 2014-06-14 NOTE — Patient Instructions (Addendum)
Your induction is scheduled for 11/17 @ 7:30pm. [facility] entrance and let them know you are there to be induced. They will send someone from Labor & Delivery to come get you.    Call the office [phone]) or go to [facility] if:  You begin to have strong, frequent contractions  Your water breaks.  Sometimes it is a big gush of fluid, sometimes it is just a trickle that keeps getting your panties wet or running down your legs  You have vaginal bleeding.  It is normal to have a small amount of spotting if your cervix was checked.   You don't feel your baby moving like normal.  If you don't, get you something to eat and drink and lay down and focus on feeling your baby move.  You should feel at least 10 movements in 2 hours.  If you don't, you should call the office or go to [facility].   [facility] Contractions Contractions of the uterus can occur throughout pregnancy. Contractions are not always a sign that you are in labor.  WHAT ARE BRAXTON HICKS CONTRACTIONS?  Contractions that occur before labor are called Braxton Hicks contractions, or false labor. Toward the end of pregnancy (32-34 weeks), these contractions can develop more often and may become more forceful. This is not true labor because these contractions do not result in opening (dilatation) and thinning of the cervix. They are sometimes difficult to tell apart from true labor because these contractions can be forceful and people have different pain tolerances. You should not feel embarrassed if you go to the hospital with false labor. Sometimes, the only way to tell if you are in true labor is for your health care provider to look for changes in the cervix. If there are no prenatal problems or other health problems associated with the pregnancy, it is completely safe to be sent home with false labor and await the onset of true labor. HOW CAN YOU TELL THE DIFFERENCE BETWEEN  TRUE AND FALSE LABOR? False Labor 5. The contractions of false labor are usually shorter and not as hard as those of true labor.  6. The contractions are usually irregular.  7. The contractions are often felt in the front of the lower abdomen and in the groin.  8. The contractions may go away when you walk around or change positions while lying down.  9. The contractions get weaker and are shorter lasting as time goes on.  10. The contractions do not usually become progressively stronger, regular, and closer together as with true labor.  True Labor  Contractions in true labor last 30-70 seconds, become very regular, usually become more intense, and increase in frequency.   The contractions do not go away with walking.   The discomfort is usually felt in the top of the uterus and spreads to the lower abdomen and low back.   True labor can be determined by your health care provider with an exam. This will show that the cervix is dilating and getting thinner.  WHAT TO REMEMBER  Keep up with your usual exercises and follow other instructions given by your health care provider.   Take medicines as directed by your health care provider.   Keep your regular prenatal appointments.   Eat and drink lightly if you think you are going into labor.   If Braxton Hicks contractions are making you uncomfortable:   Change your position from lying down or resting  to walking, or from walking to resting.   Sit and rest in a tub of warm water.   Drink 2-3 glasses of water. Dehydration may cause these contractions.   Do slow and deep breathing several times an hour.  WHEN SHOULD I SEEK IMMEDIATE MEDICAL CARE? Seek immediate medical care if:  Your contractions become stronger, more regular, and closer together.   You have fluid leaking or gushing from your vagina.   You have a fever.   You pass blood-tinged mucus.   You have vaginal bleeding.   You have continuous  abdominal pain.   You have low back pain that you never had before.   You feel your baby's head pushing down and causing pelvic pressure.   Your baby is not moving as much as it used to.  Document Released: 07/23/2005 Document Revised: 07/28/2013 Document Reviewed: 05/04/2013 [facility] Patient Information 2015 [place]. This information is not intended to replace advice given to you by your health care provider. Make sure you discuss any questions you have with your health care provider.

## 2014-06-14 NOTE — Progress Notes (Signed)
Low-risk OB appointment G1P0 8477w6d Estimated Date of Delivery: 06/15/14 BP 120/68 mmHg  Wt 167 lb (75.751 kg)  BP, weight, and urine reviewed.  Refer to obstetrical flow sheet for FH & FHR.  Reports good fm.  Denies regular uc's, lof, vb, or uti s/s. No complaints. FH 36cm, has been s<d, last u/s @ 35.2wks afi nl, efw 20%.  Declined SVE.  Reviewed labor s/s, fkc. Plan:  Continue routine obstetrical care, get afi/efw u/s. IOL scheduled for 11/17 @ 7:30pm for postdates if needed.  F/U asap for growth/afi u/s, then 1wk  for OB appointment

## 2014-06-16 ENCOUNTER — Other Ambulatory Visit: Payer: Self-pay | Admitting: Women's Health

## 2014-06-16 ENCOUNTER — Ambulatory Visit (INDEPENDENT_AMBULATORY_CARE_PROVIDER_SITE_OTHER): Payer: Medicaid Other

## 2014-06-16 DIAGNOSIS — O480 Post-term pregnancy: Secondary | ICD-10-CM

## 2014-06-16 DIAGNOSIS — Z029 Encounter for administrative examinations, unspecified: Secondary | ICD-10-CM

## 2014-06-16 DIAGNOSIS — O26843 Uterine size-date discrepancy, third trimester: Secondary | ICD-10-CM

## 2014-06-16 NOTE — Progress Notes (Signed)
U/S(40+1wks)-vtx active fetus, BPP 8/8, fluid WNL AFI-6.6cm SDP-2.4cm, EFW 6 lb 7 oz (11th%tile), UA Doppler RI-0.56 & 0.54, FHR-141 bpm, Gr 2 placenta

## 2014-06-17 ENCOUNTER — Inpatient Hospital Stay (HOSPITAL_COMMUNITY): Payer: Medicaid Other | Admitting: Anesthesiology

## 2014-06-17 ENCOUNTER — Inpatient Hospital Stay (HOSPITAL_COMMUNITY)
Admission: AD | Admit: 2014-06-17 | Discharge: 2014-06-19 | DRG: 775 | Disposition: A | Discharge status: Home / Self Care | Payer: Medicaid Other | Source: Ambulatory Visit | Attending: Obstetrics & Gynecology | Admitting: Obstetrics & Gynecology

## 2014-06-17 ENCOUNTER — Encounter (HOSPITAL_COMMUNITY): Payer: Self-pay | Admitting: *Deleted

## 2014-06-17 DIAGNOSIS — D649 Anemia, unspecified: Secondary | ICD-10-CM | POA: Diagnosis present

## 2014-06-17 DIAGNOSIS — Z3A40 40 weeks gestation of pregnancy: Secondary | ICD-10-CM | POA: Diagnosis present

## 2014-06-17 DIAGNOSIS — O471 False labor at or after 37 completed weeks of gestation: Secondary | ICD-10-CM | POA: Diagnosis present

## 2014-06-17 DIAGNOSIS — O99824 Streptococcus B carrier state complicating childbirth: Secondary | ICD-10-CM | POA: Diagnosis present

## 2014-06-17 DIAGNOSIS — O9902 Anemia complicating childbirth: Secondary | ICD-10-CM | POA: Diagnosis present

## 2014-06-17 DIAGNOSIS — Z3403 Encounter for supervision of normal first pregnancy, third trimester: Secondary | ICD-10-CM

## 2014-06-17 DIAGNOSIS — IMO0001 Reserved for inherently not codable concepts without codable children: Secondary | ICD-10-CM

## 2014-06-17 LAB — CBC
HCT: 31.2 % — ABNORMAL LOW (ref 36.0–46.0)
Hemoglobin: 9.5 g/dL — ABNORMAL LOW (ref 12.0–15.0)
MCH: 22.7 pg — AB (ref 26.0–34.0)
MCHC: 30.4 g/dL (ref 30.0–36.0)
MCV: 74.6 fL — AB (ref 78.0–100.0)
PLATELETS: 311 10*3/uL (ref 150–400)
RBC: 4.18 MIL/uL (ref 3.87–5.11)
RDW: 16.6 % — AB (ref 11.5–15.5)
WBC: 18.9 10*3/uL — ABNORMAL HIGH (ref 4.0–10.5)

## 2014-06-17 LAB — RPR

## 2014-06-17 LAB — TYPE AND SCREEN
ABO/RH(D): O POS
Antibody Screen: NEGATIVE

## 2014-06-17 LAB — ABO/RH: ABO/RH(D): O POS

## 2014-06-17 MED ORDER — IBUPROFEN 600 MG PO TABS
600.0000 mg | ORAL_TABLET | Freq: Four times a day (QID) | ORAL | Status: DC
  Administered 2014-06-17 – 2014-06-19 (×7): 600 mg via ORAL
  Filled 2014-06-17 (×7): qty 1

## 2014-06-17 MED ORDER — LACTATED RINGERS IV SOLN: 500.0000 mL | INTRAVENOUS | Status: DC | PRN

## 2014-06-17 MED ORDER — ONDANSETRON HCL 4 MG/2ML IJ SOLN: 4.0000 mg | INTRAMUSCULAR | Status: DC | PRN

## 2014-06-17 MED ORDER — DIPHENHYDRAMINE HCL 50 MG/ML IJ SOLN: 12.5000 mg | INTRAMUSCULAR | Status: DC | PRN

## 2014-06-17 MED ORDER — SIMETHICONE 80 MG PO CHEW: 80.0000 mg | CHEWABLE_TABLET | ORAL | Status: DC | PRN

## 2014-06-17 MED ORDER — PENICILLIN G POTASSIUM 5000000 UNITS IJ SOLR
5.0000 10*6.[IU] | Freq: Once | INTRAVENOUS | Status: AC
  Administered 2014-06-17: 5 10*6.[IU] via INTRAVENOUS
  Filled 2014-06-17: qty 5

## 2014-06-17 MED ORDER — LIDOCAINE HCL (PF) 1 % IJ SOLN
INTRAMUSCULAR | Status: DC | PRN
  Administered 2014-06-17 (×2): 5 mL

## 2014-06-17 MED ORDER — OXYCODONE-ACETAMINOPHEN 5-325 MG PO TABS
1.0000 | ORAL_TABLET | ORAL | Status: DC | PRN
  Administered 2014-06-17 – 2014-06-18 (×3): 1 via ORAL
  Filled 2014-06-17 (×2): qty 1

## 2014-06-17 MED ORDER — EPHEDRINE 5 MG/ML INJ
10.0000 mg | INTRAVENOUS | Status: DC | PRN
  Filled 2014-06-17: qty 2

## 2014-06-17 MED ORDER — FENTANYL 2.5 MCG/ML BUPIVACAINE 1/10 % EPIDURAL INFUSION (WH - ANES)
14.0000 mL/h | INTRAMUSCULAR | Status: DC | PRN
  Administered 2014-06-17: 14 mL/h via EPIDURAL
  Filled 2014-06-17: qty 125

## 2014-06-17 MED ORDER — TETANUS-DIPHTH-ACELL PERTUSSIS 5-2.5-18.5 LF-MCG/0.5 IM SUSP: 0.5000 mL | Freq: Once | INTRAMUSCULAR | Status: DC

## 2014-06-17 MED ORDER — SENNOSIDES-DOCUSATE SODIUM 8.6-50 MG PO TABS
2.0000 | ORAL_TABLET | ORAL | Status: DC
  Administered 2014-06-19: 2 via ORAL
  Filled 2014-06-17: qty 2

## 2014-06-17 MED ORDER — WITCH HAZEL-GLYCERIN EX PADS: 1.0000 "application " | MEDICATED_PAD | CUTANEOUS | Status: DC | PRN

## 2014-06-17 MED ORDER — FENTANYL CITRATE 0.05 MG/ML IJ SOLN: 100.0000 ug | INTRAMUSCULAR | Status: DC | PRN

## 2014-06-17 MED ORDER — OXYTOCIN BOLUS FROM INFUSION
500.0000 mL | INTRAVENOUS | Status: DC
  Administered 2014-06-17: 500 mL via INTRAVENOUS

## 2014-06-17 MED ORDER — LIDOCAINE HCL (PF) 1 % IJ SOLN
30.0000 mL | INTRAMUSCULAR | Status: DC | PRN
  Administered 2014-06-17: 30 mL via SUBCUTANEOUS
  Filled 2014-06-17: qty 30

## 2014-06-17 MED ORDER — PHENYLEPHRINE 40 MCG/ML (10ML) SYRINGE FOR IV PUSH (FOR BLOOD PRESSURE SUPPORT)
80.0000 ug | PREFILLED_SYRINGE | INTRAVENOUS | Status: DC | PRN
  Filled 2014-06-17: qty 2
  Filled 2014-06-17: qty 10

## 2014-06-17 MED ORDER — OXYCODONE-ACETAMINOPHEN 5-325 MG PO TABS: 2.0000 | ORAL_TABLET | ORAL | Status: DC | PRN

## 2014-06-17 MED ORDER — ONDANSETRON HCL 4 MG/2ML IJ SOLN: 4.0000 mg | Freq: Four times a day (QID) | INTRAMUSCULAR | Status: DC | PRN

## 2014-06-17 MED ORDER — ZOLPIDEM TARTRATE 5 MG PO TABS: 5.0000 mg | ORAL_TABLET | Freq: Every evening | ORAL | Status: DC | PRN

## 2014-06-17 MED ORDER — LACTATED RINGERS IV SOLN
500.0000 mL | Freq: Once | INTRAVENOUS | Status: AC
  Administered 2014-06-17: 500 mL via INTRAVENOUS

## 2014-06-17 MED ORDER — PHENYLEPHRINE 40 MCG/ML (10ML) SYRINGE FOR IV PUSH (FOR BLOOD PRESSURE SUPPORT)
80.0000 ug | PREFILLED_SYRINGE | INTRAVENOUS | Status: DC | PRN
  Filled 2014-06-17: qty 2

## 2014-06-17 MED ORDER — LACTATED RINGERS IV SOLN
INTRAVENOUS | Status: DC
  Administered 2014-06-17 (×3): via INTRAVENOUS

## 2014-06-17 MED ORDER — FLEET ENEMA 7-19 GM/118ML RE ENEM: 1.0000 | ENEMA | RECTAL | Status: DC | PRN

## 2014-06-17 MED ORDER — CITRIC ACID-SODIUM CITRATE 334-500 MG/5ML PO SOLN
30.0000 mL | ORAL | Status: DC | PRN
  Administered 2014-06-17: 30 mL via ORAL
  Filled 2014-06-17: qty 15

## 2014-06-17 MED ORDER — LANOLIN HYDROUS EX OINT: TOPICAL_OINTMENT | CUTANEOUS | Status: DC | PRN

## 2014-06-17 MED ORDER — PRENATAL MULTIVITAMIN CH
1.0000 | ORAL_TABLET | Freq: Every day | ORAL | Status: DC
  Administered 2014-06-18 – 2014-06-19 (×2): 1 via ORAL
  Filled 2014-06-17 (×2): qty 1

## 2014-06-17 MED ORDER — DIBUCAINE 1 % RE OINT: 1.0000 "application " | TOPICAL_OINTMENT | RECTAL | Status: DC | PRN

## 2014-06-17 MED ORDER — OXYTOCIN 40 UNITS IN LACTATED RINGERS INFUSION - SIMPLE MED
62.5000 mL/h | INTRAVENOUS | Status: DC
  Filled 2014-06-17: qty 1000

## 2014-06-17 MED ORDER — OXYCODONE-ACETAMINOPHEN 5-325 MG PO TABS
1.0000 | ORAL_TABLET | ORAL | Status: DC | PRN
  Filled 2014-06-17: qty 1

## 2014-06-17 MED ORDER — BENZOCAINE-MENTHOL 20-0.5 % EX AERO
1.0000 "application " | INHALATION_SPRAY | CUTANEOUS | Status: DC | PRN
  Administered 2014-06-17: 1 via TOPICAL
  Filled 2014-06-17: qty 56

## 2014-06-17 MED ORDER — ONDANSETRON HCL 4 MG PO TABS: 4.0000 mg | ORAL_TABLET | ORAL | Status: DC | PRN

## 2014-06-17 MED ORDER — DIPHENHYDRAMINE HCL 25 MG PO CAPS: 25.0000 mg | ORAL_CAPSULE | Freq: Four times a day (QID) | ORAL | Status: DC | PRN

## 2014-06-17 MED ORDER — PENICILLIN G POTASSIUM 5000000 UNITS IJ SOLR
2.5000 10*6.[IU] | INTRAVENOUS | Status: DC
  Administered 2014-06-17: 2.5 10*6.[IU] via INTRAVENOUS
  Filled 2014-06-17 (×4): qty 2.5

## 2014-06-17 MED ORDER — ACETAMINOPHEN 325 MG PO TABS: 650.0000 mg | ORAL_TABLET | ORAL | Status: DC | PRN

## 2014-06-17 NOTE — Progress Notes (Signed)
   Melanie Price is a 23 y.o. G1P0 at 5916w2d  admitted for active labor  Subjective: Feeling rectal pressure  Objective: Filed Vitals:   06/17/14 1600 06/17/14 1630 06/17/14 1700 06/17/14 1731  BP: 114/74 106/69 99/69 99/71   Pulse: 96 91 92 96  Temp:   98.3 F (36.8 C)   TempSrc:   Oral   Resp: 20 18 20 20   Height:      Weight:      SpO2:          FHT:  FHR: 130 bpm, variability: moderate,  accelerations:  Present,  decelerations:  Present mild variables UC:   regular, every 2-3 minutes SVE:   Dilation: 10 Effacement (%): 100 Station: 0 Exam by:: Ace GinsL. Cresenzo, RN AROM with clear fluid Labs: Lab Results  Component Value Date   WBC 18.9* 06/17/2014   HGB 9.5* 06/17/2014   HCT 31.2* 06/17/2014   MCV 74.6* 06/17/2014   PLT 311 06/17/2014    Assessment / Plan: Spontaneous labor, progressing normally Will labor down Labor: Progressing normally Fetal Wellbeing:  Category II Pain Control:  Epidural Anticipated MOD:  NSVD  CRESENZO-DISHMAN,Maleena Eddleman 06/17/2014, 6:34 PM

## 2014-06-17 NOTE — Progress Notes (Signed)
Reviewed tracing--category 1.  But pt was taken off monitor at 0927 so no current tracing.  Pt may come to 174.

## 2014-06-17 NOTE — Progress Notes (Signed)
Melanie SkinnerKayla C Price is a 23 y.o. G1P0 at 8925w2d by LMP consistent with 1st trimester U/S, admitted for spontaneous onset of labor.  Subjective: Comfortable, doing well.  Objective: BP 114/75 mmHg  Pulse 95  Temp(Src) 98.5 F (36.9 C) (Oral)  Resp 20  Ht 5' 5.5" (1.664 m)  Wt 167 lb (75.751 kg)  BMI 27.36 kg/m2  SpO2 98%      FHT:  FHR: 135 bpm, variability: moderate,  accelerations:  Present,  decelerations:  Absent UC:   irregualr SVE:   Dilation: 5.5 Effacement (%): 90 Station: -1 Exam by:: Boneta LucksShniqual Price, CNM student  Labs: Lab Results  Component Value Date   WBC 18.9* 06/17/2014   HGB 9.5* 06/17/2014   HCT 31.2* 06/17/2014   MCV 74.6* 06/17/2014   PLT 311 06/17/2014    Assessment / Plan: Spontaneous labor, progressing normally  Labor: Progressing normally Preeclampsia:  N/A Fetal Wellbeing:  Category I Pain Control:  Epidural I/D:  n/a Anticipated MOD:  NSVD  Price,Melanie SHWON 06/17/2014, 1:56 PM   I was consulted RE: the exam and agree with above.  [place] Reem Fleury, [place]M 06/17/2014 7:28 PM

## 2014-06-17 NOTE — MAU Note (Signed)
Regular contractions, past couple hours. Now every . No bleeding or leaking. Denies problems with preg. Was 2 cm when last checked.

## 2014-06-17 NOTE — Anesthesia Procedure Notes (Signed)
Epidural Patient location during procedure: OB Start time: 06/17/2014 11:25 AM  Staffing Anesthesiologist: Brayton CavesJACKSON, Lakishia Bourassa Performed by: anesthesiologist   Preanesthetic Checklist Completed: patient identified, site marked, surgical consent, pre-op evaluation, timeout performed, IV checked, risks and benefits discussed and monitors and equipment checked  Epidural Patient position: sitting Prep: site prepped and draped and DuraPrep Patient monitoring: continuous pulse ox and blood pressure Approach: midline Location: L3-L4 Injection technique: LOR air  Needle:  Needle type: Tuohy  Needle gauge: 17 G Needle length: 9 cm and 9 Needle insertion depth: 5 cm cm Catheter type: closed end flexible Catheter size: 19 Gauge Catheter at skin depth: 10 cm Test dose: negative  Assessment Events: blood not aspirated, injection not painful, no injection resistance, negative IV test and no paresthesia  Additional Notes Patient identified.  Risk benefits discussed including failed block, incomplete pain control, headache, nerve damage, paralysis, blood pressure changes, nausea, vomiting, reactions to medication both toxic or allergic, and postpartum back pain.  Patient expressed understanding and wished to proceed.  All questions were answered.  Sterile technique used throughout procedure and epidural site dressed with sterile barrier dressing. No paresthesia or other complications noted.The patient did not experience any signs of intravascular injection such as tinnitus or metallic taste in mouth nor signs of intrathecal spread such as rapid motor block. Please see nursing notes for vital signs.

## 2014-06-17 NOTE — MAU Note (Signed)
C/o ucs since 0200 this AM;

## 2014-06-17 NOTE — Anesthesia Preprocedure Evaluation (Signed)

## 2014-06-17 NOTE — H&P (Signed)
Subjective:  Melanie Price is a 23 y.o. G1 P0 female with EDC 06/15/14 at 40 and 2/[redacted] weeks gestation who is being admitted for labor management.  Her current obstetrical history is significant for anemia.  Patient reports contractions since 0200.   Fetal Movement: normal.    [facility]  FOB Melanie Price, [place]21yo, 1st baby, married  Dating By LMP/1st trimester u/s  Pap 11/19/13: neg  GC/CT Initial:        -/-        36+wks:-/-  Genetic Screen NT/IT: declined  CF screen declined  Anatomic US Normal female 'Melanie Price'  Flu vaccine 05/13/2014 declined  Tdap Recommended ~ 28wks  Glucose Screen  2 hr  82/85/72  GBS Positive  Feed Preference Bottle, thinking about breast  Contraception mirena  Circumcision Yes, at ft  Childbirth Classes Didn't make it, recommended tour  Pediatrician Undecided, info given   Patient Active Problem List   Diagnosis Date Noted  . Active labor 06/17/2014  . Anemia affecting pregnancy 04/14/2014  . Supervision of normal first pregnancy 11/19/2013   ROS: Pertinent findings in HPI.  Objective:   Vital signs in last 24 hours: Temp:  [97.8 F (36.6 C)-98.5 F (36.9 C)] 97.8 F (36.6 C) (11/12 1033) Pulse Rate:  [97-101] 101 (11/12 1033) Resp:  [18] 18 (11/12 1033) BP: (108-131)/(70-72) 108/70 mmHg (11/12 1033) Weight:  [167 lb (75.751 kg)] 167 lb (75.751 kg) (11/12 0815)   General:   alert and cooperative  Skin:   normal  HEENT:  sclera clear, anicteric, neck supple with midline trachea and trachea midline  Lungs:   normal rate, no SOB  Heart:   regular rate and rhythm  Breasts:   normal without suspicious masses, skin or nipple changes or axillary nodes  Abdomen:  gravid   Pelvis:  Exam deferred.  FHT:  140 BPM  Uterine Size: size equals dates  Presentations: cephalic  Cervix:    Dilation: 5cm   Effacement: 90%   Station:  -2   Consistency: medium   Position: middle   Lab Review  O, GBS positive  AFP: declined  One hour GTT: Normal     Assessment/Plan:  40 and 2/[redacted] weeks gestation. Satisfactory labor progress. Obstetrical history significant for anemia .     Risks, benefits, alternatives and possible complications have been discussed in detail with the patient.  Pre-admission, admission, and post admission procedures and expectations were discussed in detail.  All questions answered, all appropriate consents will be signed at the Hospital. Admission is planned for today.  Anticipate vaginal delivery.    I was present for the exam and agree with above.  [place] Woods[place]ia Shahad Price, CNM 06/17/2014 7:30 PM

## 2014-06-18 NOTE — Plan of Care (Signed)
Problem: Discharge Progression Outcomes Goal: Activity appropriate for discharge plan Outcome: Completed/Met Date Met:  91/06/81 Goal: Complications resolved/controlled Outcome: Completed/Met Date Met:  06/18/14

## 2014-06-18 NOTE — Lactation Note (Deleted)
This note was copied from the chart of Melanie Price Danowski. Lactation Consultation Note  Patient Name: Melanie Price Craze ZOXWR'UToday's Date: 06/18/2014 Reason for consult: Initial assessment;Breast/nipple pain  Visited with Mom, baby 2816 hrs old.  Mom complaining of pain with latching.  Mom has slight crack beginning on base of left nipple.  Manual breast expression taught and encouraged her to put colostrum on nipples post feedings.  Assisted with latch, and basic teaching done.  Pain level 1.  Comfort Gels given with instructions on use and care.  Brochure given and explained about IP and OP lactation services available to her.  Encouraged skin to skin, and cue based feedings.  Encouraged to call prn.     Consult Status Consult Status: Follow-up Date: 06/19/14 Follow-up type: In-patient    [name], Mayar Whittier E 06/18/2014, 12:55 PM

## 2014-06-18 NOTE — Progress Notes (Signed)
Post Partum Day 1 Subjective: no complaints, up ad lib, voiding and tolerating PO, small lochia, plans to bottle feed, nexplanon  Objective: Blood pressure 116/77, pulse 82, temperature 97.7 F (36.5 C), temperature source Oral, resp. rate 18, height 5' 5.5" (1.664 m), weight 75.751 kg (167 lb), SpO2 98 %, unknown if currently breastfeeding.  Physical Exam:  General: alert, cooperative and no distress Lochia:normal flow Chest: CTAB Heart: RRR no m/r/g Abdomen: +BS, soft, nontender,  Uterine Fundus: firm DVT Evaluation: No evidence of DVT seen on physical exam. Extremities: no edema   Recent Labs  06/17/14 1000  HGB 9.5*  HCT 31.2*    Assessment/Plan: Plan for discharge tomorrow   LOS: 1 day   CRESENZO-DISHMAN,Durand Wittmeyer 06/18/2014, 7:16 AM

## 2014-06-18 NOTE — Plan of Care (Signed)
Problem: Phase II Progression Outcomes Goal: Afebrile, VS remain stable Outcome: Completed/Met Date Met:  06/18/14 Goal: Other Phase II Outcomes/Goals Outcome: Completed/Met Date Met:  06/18/14

## 2014-06-18 NOTE — Plan of Care (Signed)
Problem: Phase II Progression Outcomes Goal: Progress activity as tolerated unless otherwise ordered Outcome: Completed/Met Date Met:  06/18/14 Goal: Afebrile, VS remain stable Outcome: Completed/Met Date Met:  06/18/14 Goal: Rh isoimmunization per orders Outcome: Not Applicable Date Met:  16/54/61 Goal: Tolerating diet Outcome: Completed/Met Date Met:  06/18/14

## 2014-06-18 NOTE — Plan of Care (Signed)
Problem: Consults Goal: Postpartum Patient Education (See Patient Education module for education specifics.) Outcome: Completed/Met Date Met:  06/18/14  Problem: Phase I Progression Outcomes Goal: Pain controlled with appropriate interventions Outcome: Completed/Met Date Met:  06/18/14 Goal: Voiding adequately Outcome: Completed/Met Date Met:  06/18/14 Goal: OOB as tolerated unless otherwise ordered Outcome: Completed/Met Date Met:  06/18/14 Goal: VS, stable, temp < 100.4 degrees F Outcome: Completed/Met Date Met:  06/18/14 Goal: Initial discharge plan identified Outcome: Completed/Met Date Met:  06/18/14 Goal: Other Phase I Outcomes/Goals Outcome: Completed/Met Date Met:  06/18/14  Problem: Phase II Progression Outcomes Goal: Pain controlled on oral analgesia Outcome: Completed/Met Date Met:  06/18/14

## 2014-06-18 NOTE — Anesthesia Postprocedure Evaluation (Signed)
Anesthesia Post Note  Patient: Melanie SkinnerKayla C Price  Procedure(s) Performed: * No procedures listed *  Anesthesia type: Epidural  Patient location: Mother/Baby  Post pain: Pain level controlled  Post assessment: Post-op Vital signs reviewed  Last Vitals:  Filed Vitals:   06/18/14 0550  BP: 116/77  Pulse: 82  Temp: 36.5 C  Resp: 18    Post vital signs: Reviewed  Level of consciousness: awake  Complications: No apparent anesthesia complications

## 2014-06-18 NOTE — Lactation Note (Deleted)
This note was copied from the chart of Melanie Lorenza EvangelistKayla Munroe. Lactation Consultation Note  Patient Name: Melanie Price WUJWJ'XToday's Date: 06/18/2014     Visited with Mom, baby 4916 hrs old. Mom complaining of pain with latching. Mom has slight crack beginning (skin intact) on base of left nipple. Manual breast expression taught and encouraged her to put colostrum on nipples post feedings. Assisted with latch, and basic teaching done. Pain level 1. Comfort Gels given with instructions on use and care. Brochure given and explained about IP and OP lactation services available to her. Encouraged skin to skin, and cue based feedings. Encouraged to call prn.        [name], Quintasha Gren E 06/18/2014, 1:52 PM

## 2014-06-18 NOTE — Progress Notes (Signed)
UR chart review completed.  

## 2014-06-19 MED ORDER — IBUPROFEN 600 MG PO TABS: 600.0000 mg | ORAL_TABLET | Freq: Four times a day (QID) | ORAL | Status: DC | PRN

## 2014-06-19 NOTE — Plan of Care (Signed)
Problem: Discharge Progression Outcomes Goal: Barriers To Progression Addressed/Resolved Outcome: Not Applicable Date Met:  37/90/24 Goal: Pain controlled with appropriate interventions Outcome: Completed/Met Date Met:  06/19/14 Goal: Afebrile, VS remain stable at discharge Outcome: Completed/Met Date Met:  06/19/14 Goal: Discharge plan in place and appropriate Outcome: Completed/Met Date Met:  06/19/14

## 2014-06-19 NOTE — Discharge Instructions (Signed)

## 2014-06-19 NOTE — Discharge Summary (Signed)
Obstetric Discharge Summary Reason for Admission: onset of labor Prenatal Procedures: none Intrapartum Procedures: spontaneous vaginal delivery and GBS prophylaxis Postpartum Procedures: none Complications-Operative and Postpartum: 1st degree labial lac HEMOGLOBIN  Date Value Ref Range Status  06/17/2014 9.5* 12.0 - 15.0 g/dL Final   HCT  Date Value Ref Range Status  06/17/2014 31.2* 36.0 - 46.0 % Final   Melanie Price is a 23yo G1 admitted in the late morning of 06/17/14 @ 40.2wks in early active labor. She received and epidural and progressed to SVD by that evening. By PPD#2 she is doing well and is deemed to have received the full benefit of her hospital stay and will be discharged home. She is bottlefeeding and desires Nexplanon for contraception.  Physical Exam:  General: alert, cooperative and no distress  Lungs: nl effort Heart: RRR Lochia: appropriate Uterine Fundus: firm DVT Evaluation: No evidence of DVT seen on physical exam.  Discharge Diagnoses: Term Pregnancy-delivered  Discharge Information: Date: 06/19/2014 Activity: pelvic rest Diet: routine Medications: PNV and Ibuprofen Condition: stable Instructions: refer to practice specific booklet Discharge to: home Follow-up Information    Follow up with [facility]. Schedule an appointment as soon as possible for a visit in 6 weeks.   Why:  For your postpartum appointment.   Contact information:   [address] [place] [id number] [phone]      Newborn Data: Live born female  Birth Weight: 7 lb 2.6 oz (3250 g) APGAR: 8, 10  Home with mother.  Cam HaiSHAW, KIMBERLY CNM 06/19/2014, 8:52 AM

## 2014-06-21 ENCOUNTER — Encounter: Payer: Medicaid Other | Admitting: Women's Health

## 2014-06-21 LAB — US OB FOLLOW UP

## 2014-06-22 ENCOUNTER — Inpatient Hospital Stay (HOSPITAL_COMMUNITY): Admission: RE | Admit: 2014-06-22 | Payer: Medicaid Other | Source: Ambulatory Visit

## 2014-06-25 ENCOUNTER — Telehealth: Payer: Self-pay | Admitting: *Deleted

## 2014-06-25 NOTE — Telephone Encounter (Signed)
Spoke with Melanie Price with [facility]. Pt's hgb today was 10.0. Pt had a vaginal delivery on 06/17/14. Pt was not taking her prenatal vit, but was advised to start them back. Pt needs to eat red meat and eat green, leafy veg. Also, take prenatal vit daily. Tried to call pt but her voice mail has not been set up @ 9:36 am. Will keep trying to reach pt. JSY

## 2014-06-28 NOTE — Telephone Encounter (Signed)
Spoke with pt. Advised her hemoglobin was a little low at 10.0. Advised it's not uncommon to have a low hemoglobin after delivery. Advised to take prenatal vit daily and eat red meat and green leafy veg. Pt voiced understanding. Call transferred to front desk to schedule her postpartum visit. JSY

## 2014-07-21 ENCOUNTER — Ambulatory Visit (INDEPENDENT_AMBULATORY_CARE_PROVIDER_SITE_OTHER): Payer: Medicaid Other | Admitting: Advanced Practice Midwife

## 2014-07-21 ENCOUNTER — Encounter: Payer: Self-pay | Admitting: Advanced Practice Midwife

## 2014-07-21 NOTE — Progress Notes (Signed)
  Mickeal SkinnerKayla C Eaves is a 23 y.o. who presents for a postpartum visit. She is 5 weeks postpartum following a spontaneous vaginal delivery. I have fully reviewed the prenatal and intrapartum course. The delivery was at 40.1 gestational weeks.  Anesthesia: epidural. Postpartum course has been uneventful. Baby's course has been uneventful. Baby is feeding by bottle. Bleeding: off and on; no period yet. Bowel function is normal. Bladder function is normal. Patient is not sexually active. Contraception method is none. Postpartum depression screening: equivocal at 14.  Pt feels like she just has some stressors, nothing major. Denies SI/HI.Marland Kitchen.    Review of Systems   Constitutional: Negative for fever and chills Eyes: Negative for visual disturbances Respiratory: Negative for shortness of breath, dyspnea Cardiovascular: Negative for chest pain or palpitations  Gastrointestinal: Negative for vomiting, diarrhea and constipation Genitourinary: Negative for dysuria and urgency Musculoskeletal: Negative for back pain, joint pain, myalgias  Neurological: Negative for dizziness and headaches   Objective:     Filed Vitals:   07/21/14 0836  BP: 104/76   General:  alert, cooperative and no distress   Breasts:  negative  Lungs: clear to auscultation bilaterally  Heart:  regular rate and rhythm  Abdomen: Soft, nontender   Vulva:  normal  Vagina: normal vagina  Cervix:  closed  Corpus: Well involuted     Rectal Exam: no hemorrhoids        Assessment:    normal postpartum exam.  Plan:    1. Contraception: Nexplanon 2. Follow up in: 3 weeks for Nexplanon or as needed.

## 2014-08-11 ENCOUNTER — Ambulatory Visit (INDEPENDENT_AMBULATORY_CARE_PROVIDER_SITE_OTHER): Payer: Medicaid Other | Admitting: Advanced Practice Midwife

## 2014-08-11 ENCOUNTER — Encounter: Payer: Self-pay | Admitting: Advanced Practice Midwife

## 2014-08-11 VITALS — BP 120/78 | Ht 65.0 in | Wt 146.0 lb

## 2014-08-11 DIAGNOSIS — Z3202 Encounter for pregnancy test, result negative: Secondary | ICD-10-CM

## 2014-08-11 DIAGNOSIS — Z30017 Encounter for initial prescription of implantable subdermal contraceptive: Secondary | ICD-10-CM

## 2014-08-11 DIAGNOSIS — Z3049 Encounter for surveillance of other contraceptives: Secondary | ICD-10-CM

## 2014-08-11 LAB — POCT URINE PREGNANCY: PREG TEST UR: NEGATIVE

## 2014-08-11 NOTE — Progress Notes (Signed)
  HPI:  Melanie Price is a 24 y.o. year old Caucasian female here for Nexplanon insertion.  Her pregnancy test today was negative.  Risks/benefits/side effects of Nexplanon have been discussed and her questions have been answered.  Specifically, a failure rate of 08/998 has been reported, with an increased failure rate if pt takes St. John's Wort and/or antiseizure medicaitons.  Melanie Price is aware of the common side effect of irregular bleeding, which the incidence of decreases over time.   Past Medical History: Past Medical History  Diagnosis Date  . Medical history non-contributory     Past Surgical History: Past Surgical History  Procedure Laterality Date  . No past surgeries      Family History: Family History  Problem Relation Age of Onset  . Cancer Maternal Grandfather     skin    Social History: History  Substance Use Topics  . Smoking status: Never Smoker   . Smokeless tobacco: Never Used  . Alcohol Use: No    Allergies: No Known Allergies    Her left arm, approximatly 4 inches proximal from the elbow, was cleansed with alcohol and anesthetized with 2cc of 2% Lidocaine.  The area was cleansed again and the Nexplanon was inserted without difficulty.  A pressure bandage was applied.  Pt was instructed to remove pressure bandage in a few hours, and keep insertion site covered with a bandaid for 3 days.  Back up contraception was recommended for 2 weeks.  Follow-up scheduled PRN problems  CRESENZO-DISHMAN,Nica Friske 08/11/2014 2:42 PM

## 2014-09-03 ENCOUNTER — Encounter (HOSPITAL_COMMUNITY): Payer: Self-pay | Admitting: Emergency Medicine

## 2014-09-03 ENCOUNTER — Emergency Department (HOSPITAL_COMMUNITY)
Admission: EM | Admit: 2014-09-03 | Discharge: 2014-09-03 | Disposition: A | Discharge status: Home / Self Care | Payer: Medicaid Other | Attending: Emergency Medicine | Admitting: Emergency Medicine

## 2014-09-03 DIAGNOSIS — Z3202 Encounter for pregnancy test, result negative: Secondary | ICD-10-CM | POA: Diagnosis not present

## 2014-09-03 DIAGNOSIS — R064 Hyperventilation: Secondary | ICD-10-CM

## 2014-09-03 DIAGNOSIS — R0789 Other chest pain: Secondary | ICD-10-CM | POA: Diagnosis not present

## 2014-09-03 DIAGNOSIS — Z79899 Other long term (current) drug therapy: Secondary | ICD-10-CM | POA: Diagnosis not present

## 2014-09-03 DIAGNOSIS — R202 Paresthesia of skin: Secondary | ICD-10-CM | POA: Insufficient documentation

## 2014-09-03 DIAGNOSIS — R079 Chest pain, unspecified: Secondary | ICD-10-CM | POA: Diagnosis present

## 2014-09-03 DIAGNOSIS — R61 Generalized hyperhidrosis: Secondary | ICD-10-CM | POA: Insufficient documentation

## 2014-09-03 LAB — CBC WITH DIFFERENTIAL/PLATELET
Basophils Absolute: 0 10*3/uL (ref 0.0–0.1)
Basophils Relative: 0 % (ref 0–1)
EOS ABS: 0.1 10*3/uL (ref 0.0–0.7)
Eosinophils Relative: 1 % (ref 0–5)
HCT: 35.4 % — ABNORMAL LOW (ref 36.0–46.0)
Hemoglobin: 10.7 g/dL — ABNORMAL LOW (ref 12.0–15.0)
Lymphocytes Relative: 15 % (ref 12–46)
Lymphs Abs: 2 10*3/uL (ref 0.7–4.0)
MCH: 24 pg — AB (ref 26.0–34.0)
MCHC: 30.2 g/dL (ref 30.0–36.0)
MCV: 79.6 fL (ref 78.0–100.0)
Monocytes Absolute: 0.7 10*3/uL (ref 0.1–1.0)
Monocytes Relative: 6 % (ref 3–12)
Neutro Abs: 10.1 10*3/uL — ABNORMAL HIGH (ref 1.7–7.7)
Neutrophils Relative %: 78 % — ABNORMAL HIGH (ref 43–77)
PLATELETS: 390 10*3/uL (ref 150–400)
RBC: 4.45 MIL/uL (ref 3.87–5.11)
RDW: 16.7 % — ABNORMAL HIGH (ref 11.5–15.5)
WBC: 12.9 10*3/uL — AB (ref 4.0–10.5)

## 2014-09-03 LAB — URINALYSIS, ROUTINE W REFLEX MICROSCOPIC
Bilirubin Urine: NEGATIVE
GLUCOSE, UA: NEGATIVE mg/dL
Ketones, ur: NEGATIVE mg/dL
NITRITE: NEGATIVE
PH: 6 (ref 5.0–8.0)
PROTEIN: 30 mg/dL — AB
SPECIFIC GRAVITY, URINE: 1.02 (ref 1.005–1.030)
Urobilinogen, UA: 0.2 mg/dL (ref 0.0–1.0)

## 2014-09-03 LAB — BASIC METABOLIC PANEL
Anion gap: 7 (ref 5–15)
BUN: 14 mg/dL (ref 6–23)
CO2: 25 mmol/L (ref 19–32)
CREATININE: 0.88 mg/dL (ref 0.50–1.10)
Calcium: 9 mg/dL (ref 8.4–10.5)
Chloride: 107 mmol/L (ref 96–112)
GFR calc Af Amer: >90 mL/min (ref >90)
Glucose, Bld: 106 mg/dL — ABNORMAL HIGH (ref 70–99)
Potassium: 3.2 mmol/L — ABNORMAL LOW (ref 3.5–5.1)
SODIUM: 139 mmol/L (ref 135–145)

## 2014-09-03 LAB — URINE MICROSCOPIC-ADD ON

## 2014-09-03 LAB — TROPONIN I

## 2014-09-03 LAB — PREGNANCY, URINE: Preg Test, Ur: NEGATIVE

## 2014-09-03 MED ORDER — CEPHALEXIN 500 MG PO CAPS: 500.0000 mg | ORAL_CAPSULE | Freq: Four times a day (QID) | ORAL | Status: DC

## 2014-09-03 MED ORDER — POTASSIUM CHLORIDE CRYS ER 20 MEQ PO TBCR
40.0000 meq | EXTENDED_RELEASE_TABLET | Freq: Once | ORAL | Status: AC
  Administered 2014-09-03: 40 meq via ORAL
  Filled 2014-09-03: qty 2

## 2014-09-03 NOTE — ED Notes (Signed)
Per EMS: pt c/o recurrent chest pain and hyperventilation starting last night. Pt states "it comes for about 30 minutes and then goes away". Denies any pain or SOB at present.

## 2014-09-03 NOTE — Discharge Instructions (Signed)
°Emergency Department Resource Guide °1) Find a Doctor and Pay Out of Pocket °Although you won't have to find out who is covered by your insurance plan, it is a good idea to ask around and get recommendations. You will then need to call the office and see if the doctor you have chosen will accept you as a new patient and what types of options they offer for patients who are self-pay. Some doctors offer discounts or will set up payment plans for their patients who do not have insurance, but you will need to ask so you aren't surprised when you get to your appointment. ° °2) Contact Your Local Health Department °Not all health departments have doctors that can see patients for sick visits, but many do, so it is worth a call to see if yours does. If you don't know where your local health department is, you can check in your phone book. The CDC also has a tool to help you locate your state's health department, and many state websites also have listings of all of their local health departments. ° °3) Find a Walk-in Clinic °If your illness is not likely to be very severe or complicated, you may want to try a walk in clinic. These are popping up all over the country in pharmacies, drugstores, and shopping centers. They're usually staffed by nurse practitioners or physician assistants that have been trained to treat common illnesses and complaints. They're usually fairly quick and inexpensive. However, if you have serious medical issues or chronic medical problems, these are probably not your best option. ° °No Primary Care Doctor: °- Call [facility] Connect at  832-8000 - they can help you locate a primary care doctor that  accepts your insurance, provides certain services, etc. °- Physician Referral Service- 1-800-533-3463 ° °Chronic Pain Problems: °Organization         Address  Phone   Notes  °[facility]  (336) [phone] Patients need to be referred by their primary care doctor.  ° °Medication  Assistance: °Organization         Address  Phone   Notes  °[place] [facility] [address] °[place], [place] [address] (336) 641-8030 --Must be a resident of [place] °-- Must have NO insurance coverage whatsoever (no Medicaid/ Medicare, etc.) °-- The pt. MUST have a primary care doctor that directs their care regularly and follows them in the community °  °[facility]  (866) 331-1348   °[employer]  (888) 892-1162   ° °Agencies that provide inexpensive medical care: °Organization         Address  Phone   Notes  °[facility]  (336) 832-8035   °[facility]    (336) 832-7272   °[facility] [address] °[place], [place] [address] (336) 832-4777   °[facility] [address], °[place] (336) 271-4999   °[facility]    (336) 373-0678   °[facility]    (336) 272-1050   °[facility] ° [address], [place] Phone:  (336) 832-4444, Fax:  (336) 832-4440 Hours of Operation:  9 am - 6 pm, M-F.  Also accepts Medicaid/Medicare and self-pay.  °[facility] ° [address], [place] Phone: (336) [phone], Fax: (336) [phone]. Hours of Operation:  8:30 am - 5:30 pm, M-F.  Also accepts Medicaid and self-pay.  °[facility] [place] [address], [place] Phone: (336) 878-6027   °[facility] [address], [place], [place] (336)723-1848, Ext. 123 Mondays & Thursdays: 7-9 AM.  First 15 patients are seen on a first come, first serve basis. °  ° °Medicaid-accepting [place] Providers: ° °Organization         Address  Phone   Notes  °[facility] [address], [place] (336) 641-2100 Also accepts self-pay patients.  °[facility] [address], [place] ° (336) 856-9996   °[facility] [address], [place]  (336) 288-8857   °[facility] [address] [place] [address], [place] (336) 299-7000   °Veita Bland [address], [place]  ° (336) 373-1557 Only accepts [place] Access Medicaid patients after they have their name applied to their card.  ° °Self-Pay (no insurance) in [place]: ° °Organization         Address  Phone   Notes  °Sickle Cell Patients, [facility] [address], [place] (336) 832-1970   °[facility] [address], [place] (336) 832-4400   °[facility] ° [address], [place] (336) 992-4800   °[facility]/Dr. Osei-Bonsu ° [address] [place] [address], [place] or [address], [place] (336) [phone] Phone number for both [place] and [place] locations is the same.  °[facility] [address], [place] (336) 299-0000   °[facility] [address] [place] [address], [place] or [address] (336) 852-7530 °(336) 878-2260   °[facility] [address], [place] (336) 350-1642, phone; (336) 294-5005, fax Sees patients 1st and 3rd Saturday of every month.  Must not qualify for public or private insurance (i.e. Medicaid, Medicare, [place] Health Choice, Veterans' Benefits) • Household income should be no more than 200% of the poverty level •The clinic cannot treat you if you are pregnant or think you are pregnant • Sexually transmitted diseases are not treated at the clinic.  ° ° °Dental Care: °Organization         Address  Phone  Notes  °[place] [facility] [address], [place] (336) 641-6152 Accepts children up to age 21 who are enrolled in Medicaid or [place] Health Choice; pregnant women with a Medicaid card; and children who have applied for Medicaid or [place] Health Choice, but were declined, whose parents can pay a reduced fee at time of service.  °[place]  [facility] [place]  [address], [place] (336) 641-7733 Accepts children up to age 21 who are enrolled in Medicaid or [place] Health Choice; pregnant women with a Medicaid card; and children who have applied for Medicaid or [place] Health Choice, but were declined, whose parents can pay a reduced fee at time of service.  °[facility] ° [address], [place] (336) 641-4533 Patients are seen by appointment only. Walk-ins are not accepted. [facility] will see patients 18 years of age and older. °Monday - Tuesday (8am-5pm) °Most Wednesdays (8:30-5pm) °$30 per visit, cash only  °[facility] ° [address], [place] (336) 641-4533 Patients are seen by appointment only. Walk-ins are not accepted. [facility] will see patients 18 years of age and older. °One   Wednesday Evening (Monthly: Volunteer Based).  $30 per visit, cash only  °[facility]  (919) [phone] for adults; Children under age 4, call Graduate Pediatric Dentistry at ([phone]) [phone]. Children aged 4-14, please call ([phone]) [phone] to request a pediatric application. ° Dental services are provided in all areas of dental care including fillings, crowns and bridges, complete and partial dentures, implants, gum treatment, root canals, and extractions. Preventive care is also provided. Treatment is provided to both adults and children. °Patients are selected via a lottery and there is often a waiting list. °  °[facility] [address], °[place] ° (336) 763-8833 [email or web address] °  °[facility] [address], [place], [place] (336)723-1848, Ext. 123 Second and Fourth Thursday of each month, opens at 6:30 AM; Clinic ends at 9 AM.  Patients are seen on a first-come first-served basis, and a limited number are seen during each clinic.  ° °[facility] ° [address], [place], [place] (336) 723-7904    Eligibility Requirements °You must have lived in [place], [place], or [place] counties for at least the last three months. °  You cannot be eligible for state or federal sponsored healthcare insurance, including Veterans Administration, Medicaid, or Medicare. °  You generally cannot be eligible for healthcare insurance through your employer.  °  How to apply: °Eligibility screenings are held every Tuesday and Wednesday afternoon from 1:00 pm until 4:00 pm. You do not need an appointment for the interview!  °[facility] [address], [place], [place] 336-631-2330   °[facility]  336-342-8273   °[facility]  336-703-3100   °[facility]  336-570-6415   ° °Behavioral Health Resources in the Community: °Intensive Outpatient Programs °Organization         Address  Phone  Notes  °[facility] [address], [place], [place] 336-878-6098   °[facility] [address], [place], [place] 336-832-9800   °[facility] [address], [place], [place] ° 336-882-2125   °[facility] [address],  °[place], [place] 1-800-853-5163 or 336-641-4981   °Substance Abuse Resources °Organization         Address  Phone  Notes  °[facility]  336-882-2125   °[facility]  336-784-9470   °[facility]  336-285-9073   °[facility]  336-845-3988   °Residential & Outpatient Substance Abuse Program  1-800-659-3381   °Psychological Services °Organization         Address  Phone  Notes  °[facility]  336- 832-9600   °[facility]  336- 378-7881   °[facility] [address], [place] 1-800-853-5163 or 336-641-4981   ° °Mobile Crisis Teams °Organization         Address  Phone  Notes  °[facility], Mobile Crisis Care Unit  1-877-626-1772   °Assertive °[facility] ° [address]  [place], [place] 336-834-9664   °Sharon DeEsch [address] °[place] 336-554-5454   ° °Self-Help/Support Groups °Organization         Address  Phone             Notes  °[facility] of  - variety of support groups  336- 373-1402 Call for more information  °Narcotics Anonymous (NA), [facility] [address], °[place]  2 meetings at this location  ° °  Residential Treatment Programs Organization         Address  Phone  Notes  [facility] [address],    [place]  [phone]   [facility]  [address], [place] [phone], [place], [place] [phone]) [phone]   [facility] [address], [place] [phone] Admissions: 8am-3pm M-F  [facility] [address]    [place], [place] [phone]   [facility] [address], [place], [place] [phone]   [facility] [address]  [place], [place] [phone]   [facility] Intensive Outpatient [address], [place], [place] [phone]   [facility] ([facility] [address]  [place], [place] [phone] or [phone]   [facility] ([facility] [address] [place], [place] [phone] Accepts Medicaid  [facility] [address]  [place] [phone] Substance Abuse/Addiction Treatment   [facility]         Address  Phone  Notes  [facility]  (367)[phone](888) [phone]   Angie FavaJulie Brannon, PhD [address], [address] [place], [place]   [phone](336) [phone] or [phone](336) [phone]   [facility]   [address] [place], [place] [phone](336) [phone]   [facility] [address], [place], [place] [phone](336) [phone] Insurance/Medicaid/sponsorship through [facility] [address]                                    [place], [place] [phone](336) [phone] Therapy/tele-psych/case    [facility] [address]   [place], [place] [phone](336) [phone]    Dr. Lolly MustacheArfeen  [phone](336) [phone]   [facility] [place]  [employer] [facility] 1) [address], [place] 2) [address], [place] 3)  [address], [place] [phone](336) [phone](336) [phone]  [phone](336) [phone]   [facility] Child Abuse Hotline (352)875-9929(336) 361-522-4669 or 5207647009(336) ([phone])[phone] (After Hours)      Take the prescription as directed. Keep a diary of your symptoms and show it to your doctor at your next office visit. Avoid avoid caffinated products, such as teas, colas, coffee, chocolate. Avoid over the counter cold medicines, herbal or "natural vitamin" products, and illicit drugs because they can contain stimulants. Call your regular medical doctor on Monday to schedule a follow up appointment within the next 3 days.  Return to the Emergency Department immediately sooner if worsening.

## 2014-09-03 NOTE — ED Provider Notes (Signed)
CSN: [id number]     Arrival date & time 09/03/14  1910 History   First MD Initiated Contact with Patient 09/03/14 1933     Chief Complaint  Patient presents with  . Chest Pain      HPI Pt was seen at 2015. Per pt, c/o gradual onset and persistence of multiple intermittent episodes of chest "pain" that radiates into her upper abd, hyperventilation, diaphoresis, and bilateral "numb hands" for the past 2 days. Pt states these episodes last approximately 30 minutes before spontaneously resolving. Pt states she "thinks I'm having panic attacks." Denies cough, no vomiting/diarrhea, no back pain, no calf/LE pain or unilateral swelling, no fevers, no rash.    Past Medical History  Diagnosis Date  . Medical history non-contributory    Past Surgical History  Procedure Laterality Date  . No past surgeries     Family History  Problem Relation Age of Onset  . Cancer Maternal Grandfather     skin   History  Substance Use Topics  . Smoking status: Never Smoker   . Smokeless tobacco: Never Used  . Alcohol Use: No   OB History    Gravida Para Term Preterm AB TAB SAB Ectopic Multiple Living   1 1 1       0 1     Review of Systems ROS: Statement: All systems negative except as marked or noted in the HPI; Constitutional: Negative for fever and chills. ; ; Eyes: Negative for eye pain, redness and discharge. ; ; ENMT: Negative for ear pain, hoarseness, nasal congestion, sinus pressure and sore throat. ; ; Cardiovascular: +CP, diaphoresis. Negative for peripheral edema. ; ; Respiratory: +hyperventilation. Negative for cough, wheezing and stridor. ; ; Gastrointestinal:  Negative for vomiting, diarrhea, blood in stool, hematemesis, jaundice and rectal bleeding. . ; ; Genitourinary: Negative for dysuria, flank pain and hematuria. ; ; Musculoskeletal: Negative for back pain and neck pain. Negative for swelling and trauma.; ; Skin: Negative for pruritus, rash, abrasions, blisters, bruising and skin lesion.;  ; Neuro: +paresthesias bilat hands. Negative for headache, lightheadedness and neck stiffness. Negative for weakness, altered level of consciousness , altered mental status, extremity weakness, involuntary movement, seizure and syncope.      Allergies  Review of patient's allergies indicates no known allergies.  Home Medications   Prior to Admission medications   Medication Sig Start Date End Date Taking? Authorizing Provider  ibuprofen (ADVIL,MOTRIN) 600 MG tablet Take 1 tablet (600 mg total) by mouth every 6 (six) hours as needed. Patient not taking: Reported on 07/21/2014 06/19/14   Arabella MerlesKimberly D Shaw, CNM  Prenatal Vit-Min-FA-Fish Oil ([facility] PRENATAL GUMMY PO) Take 2 tablets by mouth daily.    Historical Provider, MD   BP 111/67 mmHg  Pulse 79  Temp(Src) 97.8 F (36.6 C) (Oral)  Resp 15  Ht 5\' 7"  (1.702 m)  Wt 140 lb (63.504 kg)  BMI 21.92 kg/m2  SpO2 100%  LMP 09/02/2014 Physical Exam  2020: Physical examination:  Nursing notes reviewed; Vital signs and O2 SAT reviewed;  Constitutional: Well developed, Well nourished, Well hydrated, In no acute distress; Head:  Normocephalic, atraumatic; Eyes: EOMI, PERRL, No scleral icterus; ENMT: Mouth and pharynx normal, Mucous membranes moist; Neck: Supple, Full range of motion, No lymphadenopathy; Cardiovascular: Regular rate and rhythm, No murmur, rub, or gallop; Respiratory: Breath sounds clear & equal bilaterally, No rales, rhonchi, wheezes.  Speaking full sentences with ease, Normal respiratory effort/excursion; Chest: Nontender, Movement normal; Abdomen: Soft, Nontender, Nondistended, Normal bowel sounds; Genitourinary:  No CVA tenderness; Extremities: Pulses normal, No tenderness, No edema, No calf edema or asymmetry.; Neuro: AA&Ox3, Major CN grossly intact.  Speech clear. No gross focal motor or sensory deficits in extremities. Climbs on and off stretcher easily by herself. Gait steady.; Skin: Color normal, Warm, Dry.   ED Course  Procedures      EKG Interpretation   Date/Time:  Friday September 03 2014 19:14:10 EST Ventricular Rate:  83 PR Interval:  158 QRS Duration: 91 QT Interval:  359 QTC Calculation: 422 R Axis:   72 Text Interpretation:  Sinus rhythm Baseline wander No old tracing to  compare Confirmed by [facility]  MD, Nicholos Johns [phone]) on 09/03/2014 8:18:49  PM      MDM  MDM Reviewed: previous chart, nursing note and vitals Reviewed previous: labs Interpretation: labs and ECG     Results for orders placed or performed during the hospital encounter of 09/03/14  CBC with Differential  Result Value Ref Range   WBC 12.9 (H) 4.0 - 10.5 K/uL   RBC 4.45 3.87 - 5.11 MIL/uL   Hemoglobin 10.7 (L) 12.0 - 15.0 g/dL   HCT 60.4 (L) 54.0 - 98.1 %   MCV 79.6 78.0 - 100.0 fL   MCH 24.0 (L) 26.0 - 34.0 pg   MCHC 30.2 30.0 - 36.0 g/dL   RDW 19.1 (H) 47.8 - 29.5 %   Platelets 390 150 - 400 K/uL   Neutrophils Relative % 78 (H) 43 - 77 %   Neutro Abs 10.1 (H) 1.7 - 7.7 K/uL   Lymphocytes Relative 15 12 - 46 %   Lymphs Abs 2.0 0.7 - 4.0 K/uL   Monocytes Relative 6 3 - 12 %   Monocytes Absolute 0.7 0.1 - 1.0 K/uL   Eosinophils Relative 1 0 - 5 %   Eosinophils Absolute 0.1 0.0 - 0.7 K/uL   Basophils Relative 0 0 - 1 %   Basophils Absolute 0.0 0.0 - 0.1 K/uL  Basic metabolic panel  Result Value Ref Range   Sodium 139 135 - 145 mmol/L   Potassium 3.2 (L) 3.5 - 5.1 mmol/L   Chloride 107 96 - 112 mmol/L   CO2 25 19 - 32 mmol/L   Glucose, Bld 106 (H) 70 - 99 mg/dL   BUN 14 6 - 23 mg/dL   Creatinine, Ser 6.21 0.50 - 1.10 mg/dL   Calcium 9.0 8.4 - 30.8 mg/dL   GFR calc non Af Amer >90 >90 mL/min   GFR calc Af Amer >90 >90 mL/min   Anion gap 7 5 - 15  Troponin I  Result Value Ref Range   Troponin I <0.03 <0.031 ng/mL  Pregnancy, urine  Result Value Ref Range   Preg Test, Ur NEGATIVE NEGATIVE  Urinalysis, Routine w reflex microscopic  Result Value Ref Range   Color, Urine YELLOW YELLOW   APPearance HAZY (A) CLEAR    Specific Gravity, Urine 1.020 1.005 - 1.030   pH 6.0 5.0 - 8.0   Glucose, UA NEGATIVE NEGATIVE mg/dL   Hgb urine dipstick LARGE (A) NEGATIVE   Bilirubin Urine NEGATIVE NEGATIVE   Ketones, ur NEGATIVE NEGATIVE mg/dL   Protein, ur 30 (A) NEGATIVE mg/dL   Urobilinogen, UA 0.2 0.0 - 1.0 mg/dL   Nitrite NEGATIVE NEGATIVE   Leukocytes, UA SMALL (A) NEGATIVE  Urine microscopic-add on  Result Value Ref Range   Squamous Epithelial / LPF FEW (A) RARE   WBC, UA 7-10 <3 WBC/hpf   RBC / HPF TOO NUMEROUS TO  COUNT <3 RBC/hpf   Bacteria, UA FEW (A) RARE     2155:  Potassium repleted PO. Will tx for UTI. Monitor remained NSR, rate 70-90's during pt's ED visit. No tachycardia, no ectopy. Pt states she is ready to go home now. Dx and testing d/w pt and family.  Questions answered.  Verb understanding, agreeable to d/c home with outpt f/u.     Samuel Jester, DO 09/06/14 1304

## 2014-09-03 NOTE — ED Notes (Signed)
Discharge instructions given, pt demonstrated teach back and verbal understanding. No concerns voiced.  

## 2014-09-08 ENCOUNTER — Ambulatory Visit: Payer: Self-pay | Admitting: Advanced Practice Midwife

## 2014-09-14 ENCOUNTER — Ambulatory Visit (INDEPENDENT_AMBULATORY_CARE_PROVIDER_SITE_OTHER): Payer: Self-pay | Admitting: Advanced Practice Midwife

## 2014-09-14 ENCOUNTER — Encounter: Payer: Self-pay | Admitting: Advanced Practice Midwife

## 2014-09-14 VITALS — BP 100/60 | Ht 67.0 in | Wt 138.0 lb

## 2014-09-14 DIAGNOSIS — Z975 Presence of (intrauterine) contraceptive device: Secondary | ICD-10-CM

## 2014-09-14 DIAGNOSIS — N921 Excessive and frequent menstruation with irregular cycle: Secondary | ICD-10-CM | POA: Insufficient documentation

## 2014-09-14 MED ORDER — MEGESTROL ACETATE 40 MG PO TABS: ORAL_TABLET | ORAL | Status: DC

## 2014-09-14 NOTE — Progress Notes (Signed)
Patient ID: Melanie SkinnerKayla C Price, female   DOB: 04/02/1991, 24 y.o.   MRN: [id number] Pt here today for prolonged periods after having Nexplanon inserted.

## 2014-09-14 NOTE — Progress Notes (Signed)
[facility] Visit  Patient name: Nija C Rowlands MRN [id number]  Date of birth: 11/20/1990  CC & HPI:  Melanie Price is a 24 y.o. Caucasian female presenting today for bleeding.  She had Nexplanon inserted 4 weeks ago, and started bleeding "heavily" 2 weeks later.  She states that the same day she started bleeding, she began having intermittent, daily chest pain. It wakes her up at night.She went to ED 1 week ago and had negative w/u.  Dx with ? Panic  Wondering if it could be r/t NExplanon  Pertinent History Reviewed:  Medical & Surgical Hx:   Past Medical History  Diagnosis Date  . Medical history non-contributory   . Anemia    Past Surgical History  Procedure Laterality Date  . No past surgeries     Family History  Problem Relation Age of Onset  . Cancer Maternal Grandfather     skin    Current outpatient prescriptions:  .  acetaminophen (TYLENOL) 500 MG tablet, Take 500 mg by mouth every 6 (six) hours as needed for mild pain or moderate pain., Disp: , Rfl:  .  ferrous fumarate (HEMOCYTE - 106 MG FE) 325 (106 FE) MG TABS tablet, Take 1 tablet by mouth 2 (two) times daily., Disp: , Rfl:  Social History: Reviewed -  reports that she has never smoked. She has never used smokeless tobacco.  Objective Findings:  Vitals: BP 100/60 mmHg  Ht 5' 7" (1.702 m)  Wt 138 lb (62.596 kg)  BMI 21.61 kg/m2  LMP 09/02/2014  Breastfeeding? No  Physical Examination: General appearance - alert, well appearing, and in no distress Mental status - alert, oriented to person, place, and time.  Has never had anxiety/panic Chest: LCTAB  HRR&R Abdomen - soft, nontender, nondistended, no masses or organomegaly Musculoskeletal - no muscular tenderness noted Extremities - no pedal edema noted Skin - normal coloration and turgor, no rashes, no suspicious skin lesions noted  Hgb last week 10.7 (increased form 9.5 2 months ago)    10 minutes spent talking face to face.     Assessment &  Plan:  A:   BTB on Nexplanon.  Discusssed that this is a normal and expected SE  Chest pain ? Origin.  ? Reflux ? anxiety P:  Megace algorithm   F/U if bleeding is not controlled with megace  Prilosec 20mg qd  To make appt with Adult [facility] (given Sandra's direct line) to investigate other sources of chest pain (does not    CRESENZO-DISHMAN,FRANCES CNM 09/14/2014 1:50 PM       

## 2014-11-11 ENCOUNTER — Encounter: Payer: Self-pay | Admitting: Advanced Practice Midwife

## 2014-11-11 ENCOUNTER — Encounter: Payer: Self-pay | Admitting: *Deleted

## 2014-12-14 ENCOUNTER — Emergency Department (HOSPITAL_COMMUNITY)
Admission: EM | Admit: 2014-12-14 | Discharge: 2014-12-14 | Disposition: A | Discharge status: Home / Self Care | Payer: Medicaid Other | Attending: Emergency Medicine | Admitting: Emergency Medicine

## 2014-12-14 ENCOUNTER — Encounter (HOSPITAL_COMMUNITY): Payer: Self-pay

## 2014-12-14 DIAGNOSIS — N921 Excessive and frequent menstruation with irregular cycle: Secondary | ICD-10-CM | POA: Insufficient documentation

## 2014-12-14 DIAGNOSIS — Z3202 Encounter for pregnancy test, result negative: Secondary | ICD-10-CM | POA: Insufficient documentation

## 2014-12-14 DIAGNOSIS — Z862 Personal history of diseases of the blood and blood-forming organs and certain disorders involving the immune mechanism: Secondary | ICD-10-CM | POA: Insufficient documentation

## 2014-12-14 LAB — URINALYSIS, ROUTINE W REFLEX MICROSCOPIC
BILIRUBIN URINE: NEGATIVE
Glucose, UA: NEGATIVE mg/dL
KETONES UR: NEGATIVE mg/dL
Leukocytes, UA: NEGATIVE
NITRITE: NEGATIVE
Protein, ur: NEGATIVE mg/dL
Urobilinogen, UA: 0.2 mg/dL (ref 0.0–1.0)
pH: 5.5 (ref 5.0–8.0)

## 2014-12-14 LAB — URINE MICROSCOPIC-ADD ON

## 2014-12-14 LAB — WET PREP, GENITAL
Clue Cells Wet Prep HPF POC: NONE SEEN
Trich, Wet Prep: NONE SEEN
Yeast Wet Prep HPF POC: NONE SEEN

## 2014-12-14 LAB — POC URINE PREG, ED: PREG TEST UR: NEGATIVE

## 2014-12-14 NOTE — ED Notes (Signed)
Pt reports emesis along with brownish-blood tinge discharge for 4 days. Reports period was over a week ago.

## 2014-12-14 NOTE — ED Provider Notes (Signed)
CSN: [id number]     Arrival date & time 12/14/14  1328 History   First MD Initiated Contact with Patient 12/14/14 1410     Chief Complaint  Patient presents with  . Vaginal Bleeding     (Consider location/radiation/quality/duration/timing/severity/associated sxs/prior Treatment) HPI  Melanie Price is a 24 y.o. female who presents for evaluation of brown vaginal discharge for 5 days following her period last week. She feels like brown color is blood. There was an interval of 5 days of no bleeding after her last period. She usually has very light periods since she has been on Implanon, oral contraceptive. She denies fever, chills, nausea, vomiting, weakness or dizziness. There are no other known modifying factors.   Past Medical History  Diagnosis Date  . Medical history non-contributory   . Anemia    Past Surgical History  Procedure Laterality Date  . No past surgeries     Family History  Problem Relation Age of Onset  . Cancer Maternal Grandfather     skin   History  Substance Use Topics  . Smoking status: Never Smoker   . Smokeless tobacco: Never Used  . Alcohol Use: No   OB History    Gravida Para Term Preterm AB TAB SAB Ectopic Multiple Living   1 1 1       0 1     Review of Systems  All other systems reviewed and are negative.     Allergies  Review of patient's allergies indicates no known allergies.  Home Medications   Prior to Admission medications   Medication Sig Start Date End Date Taking? Authorizing Provider  acetaminophen (TYLENOL) 500 MG tablet Take 500 mg by mouth every 6 (six) hours as needed for mild pain or moderate pain.   Yes Historical Provider, MD  megestrol (MEGACE) 40 MG tablet Take 3/day (at the same time) for 5 days; 2/day for 5 days, then 1/day PO prn bleeding Patient not taking: Reported on 12/14/2014 09/14/14   Scarlette CalicoFrances Cresenzo-Dishmon, CNM   BP 110/53 mmHg  Pulse 92  Temp(Src) 99.3 F (37.4 C) (Oral)  Resp 20  Ht 5\' 7"  (1.702 m)   Wt 135 lb (61.236 kg)  BMI 21.14 kg/m2  SpO2 100%  LMP 12/06/2014 Physical Exam  Constitutional: She is oriented to person, place, and time. She appears well-developed and well-nourished.  HENT:  Head: Normocephalic and atraumatic.  Right Ear: External ear normal.  Left Ear: External ear normal.  Eyes: Conjunctivae and EOM are normal. Pupils are equal, round, and reactive to light.  Neck: Normal range of motion and phonation normal. Neck supple.  Cardiovascular: Normal rate, regular rhythm and normal heart sounds.   Pulmonary/Chest: Effort normal and breath sounds normal. She exhibits no bony tenderness.  Abdominal: Soft. There is no tenderness.  Genitourinary:  Normal external female genitalia. Small amount of brown colored vaginal discharge. No cervical abnormality. On bimanual examination she has mild diffuse tenderness with normal palpable uterus and ovaries.  Musculoskeletal: Normal range of motion.  Neurological: She is alert and oriented to person, place, and time. No cranial nerve deficit or sensory deficit. She exhibits normal muscle tone. Coordination normal.  Skin: Skin is warm, dry and intact.  Psychiatric: She has a normal mood and affect. Her behavior is normal. Judgment and thought content normal.  Nursing note and vitals reviewed.   ED Course  Procedures (including critical care time)  Medications - No data to display  Patient Vitals for the past 24 hrs:  BP Temp Temp src Pulse Resp SpO2 Height Weight  12/14/14 1335 (!) 110/53 mmHg 99.3 F (37.4 C) Oral 92 20 100 % 5\' 7"  (1.702 m) 135 lb (61.236 kg)      Medications - No data to display  Patient Vitals for the past 24 hrs:  BP Temp Temp src Pulse Resp SpO2 Height Weight  12/14/14 1335 (!) 110/53 mmHg 99.3 F (37.4 C) Oral 92 20 100 % 5\' 7"  (1.702 m) 135 lb (61.236 kg)    4:01 PM Reevaluation with update and discussion. After initial assessment and treatment, an updated evaluation reveals she is  comfortable, no further complaints. Findings discussed with patient, all questions answered.Flint Melter. Nijel Flink L     Labs Review Labs Reviewed  WET PREP, GENITAL  URINALYSIS, ROUTINE W REFLEX MICROSCOPIC  RPR  HIV ANTIBODY (ROUTINE TESTING)  POC URINE PREG, ED  GC/CHLAMYDIA PROBE AMP ([place])    Imaging Review No results found.   EKG Interpretation None      MDM   Final diagnoses:  Low back pain without sciatica, unspecified back pain laterality  Dysmenorrhea    Evaluation consistent with metromenorrhagia. Associated with presence of Implanon contraceptive implant. Doubt PID, ovarian cyst, uterine fibroid, UTI  or serious bacterial infection.  Nursing Notes Reviewed/ Care Coordinated Applicable Imaging Reviewed Interpretation of Laboratory Data incorporated into ED treatment  The patient appears reasonably screened and/or stabilized for discharge and I doubt any other medical condition or other [facility] requiring further screening, evaluation, or treatment in the ED at this time prior to discharge.  Plan: Home Medications- none; Home Treatments- rest; return here if the recommended treatment, does not improve the symptoms; Recommended follow up- GYN prn   Mancel BaleElliott Django Nguyen, MD 12/14/14 [phone]

## 2014-12-14 NOTE — Discharge Instructions (Signed)
Abnormal Uterine Bleeding Abnormal uterine bleeding can affect women at various stages in life, including teenagers, women in their reproductive years, pregnant women, and women who have reached menopause. Several kinds of uterine bleeding are considered abnormal, including:  Bleeding or spotting between periods.   Bleeding after sexual intercourse.   Bleeding that is heavier or more than normal.   Periods that last longer than usual.  Bleeding after menopause.  Many cases of abnormal uterine bleeding are minor and simple to treat, while others are more serious. Any type of abnormal bleeding should be evaluated by your health care provider. Treatment will depend on the cause of the bleeding. HOME CARE INSTRUCTIONS Monitor your condition for any changes. The following actions may help to alleviate any discomfort you are experiencing:  Avoid the use of tampons and douches as directed by your health care provider.  Change your pads frequently. You should get regular pelvic exams and Pap tests. Keep all follow-up appointments for diagnostic tests as directed by your health care provider.  SEEK MEDICAL CARE IF:   Your bleeding lasts more than 1 week.   You feel dizzy at times.  SEEK IMMEDIATE MEDICAL CARE IF:   You pass out.   You are changing pads every 15 to 30 minutes.   You have abdominal pain.  You have a fever.   You become sweaty or weak.   You are passing large blood clots from the vagina.   You start to feel nauseous and vomit. MAKE SURE YOU:   Understand these instructions.  Will watch your condition.  Will get help right away if you are not doing well or get worse. Document Released: 07/23/2005 Document Revised: 07/28/2013 Document Reviewed: 02/19/2013 ExitCare Patient Information 2015 [employer]. This information is not intended to replace advice given to you by your health care provider. Make sure you discuss any questions you have with your  health care provider.  

## 2014-12-14 NOTE — ED Notes (Signed)
Patient with no complaints at this time. Respirations even and unlabored. Skin warm/dry. Discharge instructions reviewed with patient at this time. Patient given opportunity to voice concerns/ask questions. Patient discharged at this time and left Emergency Department with steady gait.   

## 2014-12-15 LAB — GC/CHLAMYDIA PROBE AMP (~~LOC~~) NOT AT ARMC
Chlamydia: NEGATIVE
Neisseria Gonorrhea: NEGATIVE

## 2014-12-15 LAB — RPR: RPR: NONREACTIVE

## 2014-12-15 LAB — HIV ANTIBODY (ROUTINE TESTING W REFLEX): HIV Screen 4th Generation wRfx: NONREACTIVE

## 2015-04-21 ENCOUNTER — Encounter (HOSPITAL_COMMUNITY): Payer: Self-pay

## 2015-04-21 ENCOUNTER — Emergency Department (HOSPITAL_COMMUNITY)
Admission: EM | Admit: 2015-04-21 | Discharge: 2015-04-21 | Disposition: A | Discharge status: Home / Self Care | Payer: Medicaid Other | Attending: Emergency Medicine | Admitting: Emergency Medicine

## 2015-04-21 ENCOUNTER — Emergency Department (HOSPITAL_COMMUNITY): Payer: Medicaid Other

## 2015-04-21 DIAGNOSIS — W108XXA Fall (on) (from) other stairs and steps, initial encounter: Secondary | ICD-10-CM | POA: Insufficient documentation

## 2015-04-21 DIAGNOSIS — Y9289 Other specified places as the place of occurrence of the external cause: Secondary | ICD-10-CM | POA: Insufficient documentation

## 2015-04-21 DIAGNOSIS — Y998 Other external cause status: Secondary | ICD-10-CM | POA: Insufficient documentation

## 2015-04-21 DIAGNOSIS — Y9389 Activity, other specified: Secondary | ICD-10-CM | POA: Insufficient documentation

## 2015-04-21 DIAGNOSIS — S93601A Unspecified sprain of right foot, initial encounter: Secondary | ICD-10-CM | POA: Insufficient documentation

## 2015-04-21 DIAGNOSIS — Z862 Personal history of diseases of the blood and blood-forming organs and certain disorders involving the immune mechanism: Secondary | ICD-10-CM | POA: Insufficient documentation

## 2015-04-21 MED ORDER — IBUPROFEN 600 MG PO TABS: 600.0000 mg | ORAL_TABLET | Freq: Four times a day (QID) | ORAL | Status: DC | PRN

## 2015-04-21 NOTE — Discharge Instructions (Signed)
Foot Sprain The muscles and cord like structures which attach muscle to bone (tendons) that surround the feet are made up of units. A foot sprain can occur at the weakest spot in any of these units. This condition is most often caused by injury to or overuse of the foot, as from playing contact sports, or aggravating a previous injury, or from poor conditioning, or obesity. SYMPTOMS  Pain with movement of the foot.  Tenderness and swelling at the injury site.  Loss of strength is present in moderate or severe sprains. THE THREE GRADES OR SEVERITY OF FOOT SPRAIN ARE:  Mild (Grade I): Slightly pulled muscle without tearing of muscle or tendon fibers or loss of strength.  Moderate (Grade II): Tearing of fibers in a muscle, tendon, or at the attachment to bone, with small decrease in strength.  Severe (Grade III): Rupture of the muscle-tendon-bone attachment, with separation of fibers. Severe sprain requires surgical repair. Often repeating (chronic) sprains are caused by overuse. Sudden (acute) sprains are caused by direct injury or over-use. DIAGNOSIS  Diagnosis of this condition is usually by your own observation. If problems continue, a caregiver may be required for further evaluation and treatment. X-rays may be required to make sure there are not breaks in the bones (fractures) present. Continued problems may require physical therapy for treatment. PREVENTION  Use strength and conditioning exercises appropriate for your sport.  Warm up properly prior to working out.  Use athletic shoes that are made for the sport you are participating in.  Allow adequate time for healing. Early return to activities makes repeat injury more likely, and can lead to an unstable arthritic foot that can result in prolonged disability. Mild sprains generally heal in 3 to 10 days, with moderate and severe sprains taking 2 to 10 weeks. Your caregiver can help you determine the proper time required for  healing. HOME CARE INSTRUCTIONS   Apply ice to the injury for 15-20 minutes, 03-04 times per day. Put the ice in a plastic bag and place a towel between the bag of ice and your skin.  An elastic wrap (like an Ace bandage) may be used to keep swelling down.  Keep foot above the level of the heart, or at least raised on a footstool, when swelling and pain are present.  Try to avoid use other than gentle range of motion while the foot is painful. Do not resume use until instructed by your caregiver. Then begin use gradually, not increasing use to the point of pain. If pain does develop, decrease use and continue the above measures, gradually increasing activities that do not cause discomfort, until you gradually achieve normal use.  Use crutches if and as instructed, and for the length of time instructed.  Keep injured foot and ankle wrapped between treatments.  Massage foot and ankle for comfort and to keep swelling down. Massage from the toes up towards the knee.  Only take over-the-counter or prescription medicines for pain, discomfort, or fever as directed by your caregiver. SEEK IMMEDIATE MEDICAL CARE IF:   Your pain and swelling increase, or pain is not controlled with medications.  You have loss of feeling in your foot or your foot turns cold or blue.  You develop new, unexplained symptoms, or an increase of the symptoms that brought you to your caregiver. MAKE SURE YOU:   Understand these instructions.  Will watch your condition.  Will get help right away if you are not doing well or get worse. Document Released:   01/12/2002 Document Revised: 10/15/2011 Document Reviewed: 03/11/2008 ExitCare Patient Information 2015 [employer]. This information is not intended to replace advice given to you by your health care provider. Make sure you discuss any questions you have with your health care provider.  

## 2015-04-21 NOTE — ED Notes (Signed)
Pt states she tripped on the doorstep and fell on R.foot.

## 2015-04-21 NOTE — ED Provider Notes (Signed)
CSN: [id number]     Arrival date & time 04/21/15  1511 History   First MD Initiated Contact with Patient 04/21/15 1516     Chief Complaint  Patient presents with  . Ankle Pain     (Consider location/radiation/quality/duration/timing/severity/associated sxs/prior Treatment) The history is provided by the patient and a parent.   Melanie Price is a 24 y.o. female presenting with right foot pain since she tripped falling down steps just prior to arrival today.   Pain is worse in her heel and midfoot with range of motion and palpation.  She has no other injuries and denies numbness of the extremity. She has taken ibuprofen prior to arrival with no significant improvement in symptoms.    Past Medical History  Diagnosis Date  . Medical history non-contributory   . Anemia    Past Surgical History  Procedure Laterality Date  . No past surgeries     Family History  Problem Relation Age of Onset  . Cancer Maternal Grandfather     skin   Social History  Substance Use Topics  . Smoking status: Never Smoker   . Smokeless tobacco: Never Used  . Alcohol Use: No   OB History    Gravida Para Term Preterm AB TAB SAB Ectopic Multiple Living   1 1 1       0 1     Review of Systems  Constitutional: Negative for fever.  Musculoskeletal: Positive for arthralgias. Negative for myalgias and joint swelling.  Neurological: Negative for weakness and numbness.      Allergies  Review of patient's allergies indicates no known allergies.  Home Medications   Prior to Admission medications   Medication Sig Start Date End Date Taking? Authorizing Provider  acetaminophen (TYLENOL) 500 MG tablet Take 500 mg by mouth every 6 (six) hours as needed for mild pain or moderate pain.    Historical Provider, MD  ibuprofen (ADVIL,MOTRIN) 600 MG tablet Take 1 tablet (600 mg total) by mouth every 6 (six) hours as needed. 04/21/15   Burgess Amor, PA-C   BP 100/49 mmHg  Pulse 76  Temp(Src) 98 F (36.7 C)  (Oral)  Resp 16  Ht 5\' 7"  (1.702 m)  Wt 140 lb (63.504 kg)  BMI 21.92 kg/m2  SpO2 100% Physical Exam  Constitutional: She appears well-developed and well-nourished.  HENT:  Head: Atraumatic.  Neck: Normal range of motion.  Cardiovascular:  Pulses equal bilaterally  Musculoskeletal: She exhibits tenderness.       Right foot: There is bony tenderness. There is no swelling, normal capillary refill, no crepitus and no deformity.  Tenderness localized to medial right calcaneus with radiation into the midfoot.  She has no tenderness over her bilateral malleoli.  No edema or erythema noted.  Distal sensation is intact with less than 2 second cap refill in her toes, dorsalis pedis pulse is full.  She has no proximal tib-fib pain.  Neurological: She is alert. She has normal strength. She displays normal reflexes. No sensory deficit.  Skin: Skin is warm and dry.  Psychiatric: She has a normal mood and affect.    ED Course  Procedures (including critical care time) Labs Review Labs Reviewed - No data to display  Imaging Review Dg Foot Complete Right  04/21/2015   CLINICAL DATA:  Tripped over door step, fell on right foot. Lateral pain and dorsal pain.  EXAM: RIGHT FOOT COMPLETE - 3+ VIEW  COMPARISON:  None.  FINDINGS: No fracture observed. Lisfranc joint alignment normal.  No significant osseous abnormality observed.  IMPRESSION: 1.  No significant abnormality identified.   Electronically Signed   By: Gaylyn Rong M.D.   On: 04/21/2015 15:46   I have personally reviewed and evaluated these images and lab results as part of my medical decision-making.   EKG Interpretation None      MDM   Final diagnoses:  Foot sprain, right, initial encounter     Radiological studies were viewed, interpreted and considered during the medical decision making and disposition process. I agree with radiologists reading.  Results were also discussed with patient.  Pt prescribed ibuprofen,  RICE,   Crutches, aso, referral to Dr. Hilda Lias for recheck if not improved over the next week.    Burgess Amor, PA-C 04/21/15 1621  Melanie Mulders, MD 04/23/15 [phone]

## 2015-04-21 NOTE — ED Notes (Signed)
Declined crutches. Has crutches in vehicle

## 2015-09-26 ENCOUNTER — Encounter: Payer: Self-pay | Admitting: Adult Health

## 2015-09-26 ENCOUNTER — Ambulatory Visit (INDEPENDENT_AMBULATORY_CARE_PROVIDER_SITE_OTHER): Payer: 59 | Admitting: Adult Health

## 2015-09-26 VITALS — BP 110/70 | HR 76 | Ht 67.0 in | Wt 149.5 lb

## 2015-09-26 DIAGNOSIS — N939 Abnormal uterine and vaginal bleeding, unspecified: Secondary | ICD-10-CM | POA: Diagnosis not present

## 2015-09-26 DIAGNOSIS — Z3046 Encounter for surveillance of implantable subdermal contraceptive: Secondary | ICD-10-CM

## 2015-09-26 DIAGNOSIS — Z30011 Encounter for initial prescription of contraceptive pills: Secondary | ICD-10-CM

## 2015-09-26 DIAGNOSIS — Z3202 Encounter for pregnancy test, result negative: Secondary | ICD-10-CM | POA: Diagnosis not present

## 2015-09-26 DIAGNOSIS — Z3009 Encounter for other general counseling and advice on contraception: Secondary | ICD-10-CM

## 2015-09-26 DIAGNOSIS — Z309 Encounter for contraceptive management, unspecified: Secondary | ICD-10-CM

## 2015-09-26 HISTORY — DX: Encounter for contraceptive management, unspecified: Z30.9

## 2015-09-26 HISTORY — DX: Encounter for surveillance of implantable subdermal contraceptive: Z30.46

## 2015-09-26 LAB — POCT URINE PREGNANCY: PREG TEST UR: NEGATIVE

## 2015-09-26 MED ORDER — NORETHIN-ETH ESTRAD-FE BIPHAS 1 MG-10 MCG / 10 MCG PO TABS: 1.0000 | ORAL_TABLET | Freq: Every day | ORAL | Status: DC

## 2015-09-26 NOTE — Progress Notes (Signed)
Subjective:     Patient ID: Melanie Price, female   DOB: 1991/06/04, 25 y.o.   MRN: [id number]  HPI Melanie Price is a 25 year old white female in for nexplanon removal, has had BTB, and wants to try the pill.   Review of Systems Patient denies any headaches, hearing loss, fatigue, blurred vision, shortness of breath, chest pain, abdominal pain, problems with bowel movements, urination, or intercourse. No joint pain or mood swings.Has had some BTB with nexplanon, wants it out. Reviewed past medical,surgical, social and family history. Reviewed medications and allergies.     Objective:   Physical Exam BP 110/70 mmHg  Pulse 76  Ht  (1.702 m)  Wt 149 lb 8 oz (67.813 kg)  BMI 23.41 kg/m2  LMP 08/26/2015  Breastfeeding? No UPT negative, consent signed, time out called, left arm cleansed with betadine, and injected with 2.5 cc 2% lidocaine and waited til numb, was slow to numb.Under sterile technique a #11 blade was used to make small vertical incision, and a curved forceps was used to easily remove rod. Steri strips applied. Pressure dressing applied.    Assessment:     Nexplanon removal Contraceptive management    Plan:     Rx lo loestrin disp 1 pack take 1 daily with 11 refills, start today Use condoms x 4 weeks, keep clean and dry x 24 hours, no heavy lifting, keep steri strips on x 72 hours, Keep pressure dressing on x 24 hours. Follow up prn problems.   Follow up in 3 months

## 2015-09-26 NOTE — Patient Instructions (Signed)
Use condoms x 4 weeks, keep clean and dry x 24 hours, no heavy lifting, keep steri strips on x 72 hours, Keep pressure dressing on x 24 hours. Follow up prn problems. Start lo loestrin today use condoms Follow up in 3 months

## 2015-09-29 ENCOUNTER — Telehealth: Payer: Self-pay | Admitting: *Deleted

## 2015-09-29 NOTE — Telephone Encounter (Signed)
Pt states that she had her Nexplanon removed on the 20th and woke up this morning with heavy bleeding. Pt states that she has had to change a pad twice in the last hour. Pt states that it is bright red bleeding. Pt had been on her period for about 15 days prior to the removal and now bleeding again.   I spoke with Cyril Mourning and she advised the pt has more than likely started a period since the [facility] was removed, pt to push fluids and call us back in an hour with an update.   I advised the pt of this and pt verbalized understanding.

## 2015-11-14 IMAGING — US US OB COMP LESS 14 WK
1 series · 14 of 28 positions shown · non-contrast
Comparison: None.

CLINICAL DATA: Pregnant patient with pelvic cramping.

EXAM:
OBSTETRIC <14 WK US AND TRANSVAGINAL OB US
TECHNIQUE: Both transabdominal and transvaginal ultrasound examinations were
performed for complete evaluation of the gestation as well as the
maternal uterus, adnexal regions, and pelvic cul-de-sac.
Transvaginal technique was performed to assess early pregnancy.

[Series 1: us ob comp less 14 wk · 0.18mm/px · 14 of 66 slices shown]
[im 3/66]
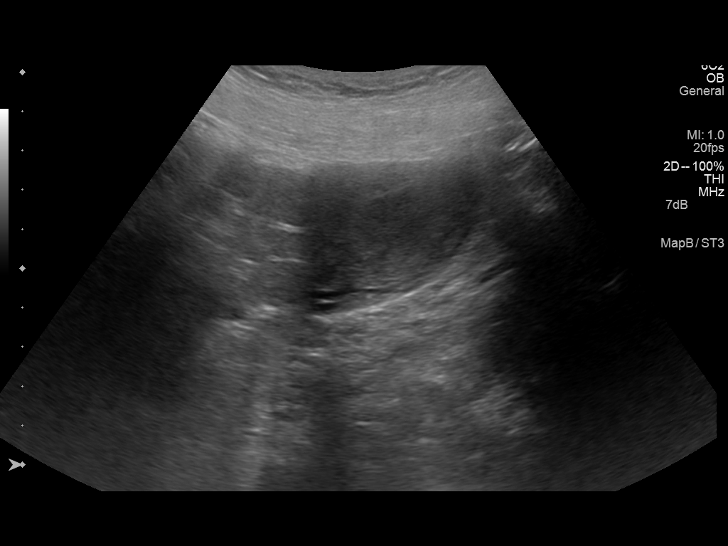
[im 8/66]
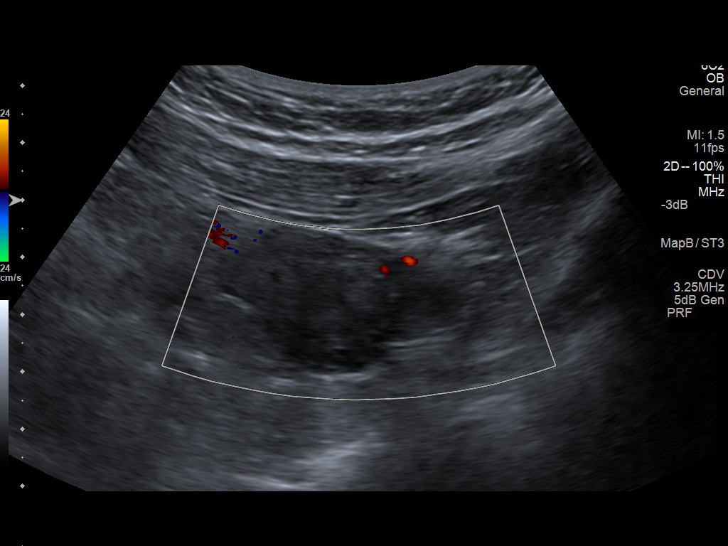
[im 13/66]
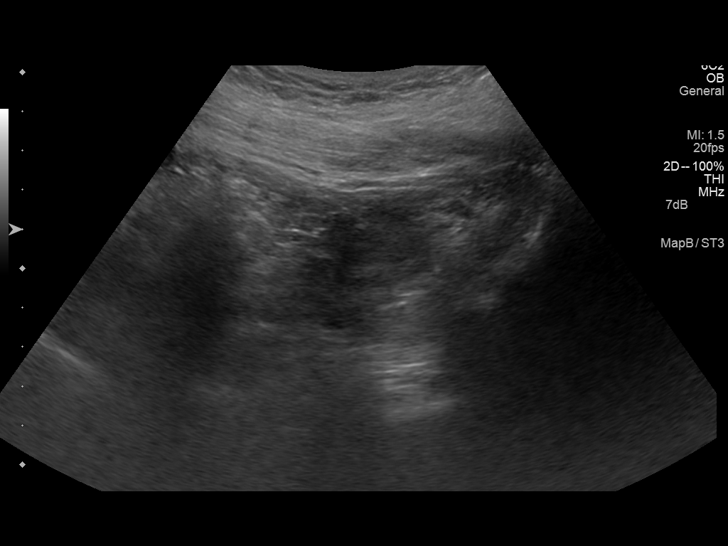
[im 17/66]
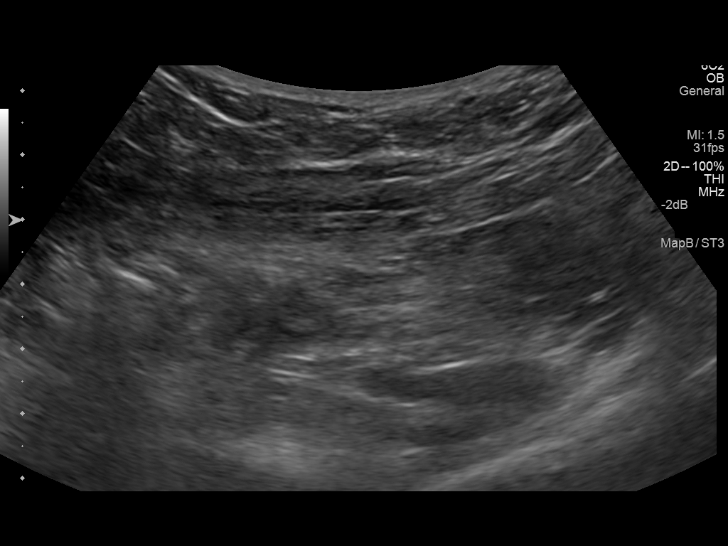
[im 22/66]
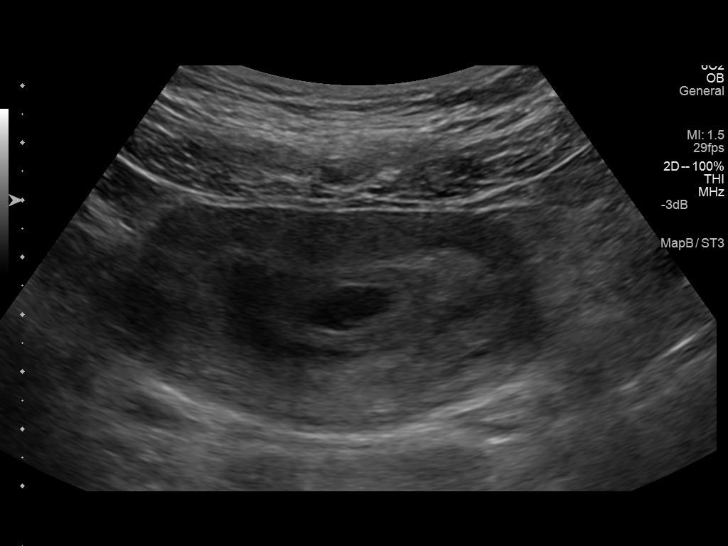
[im 27/66]
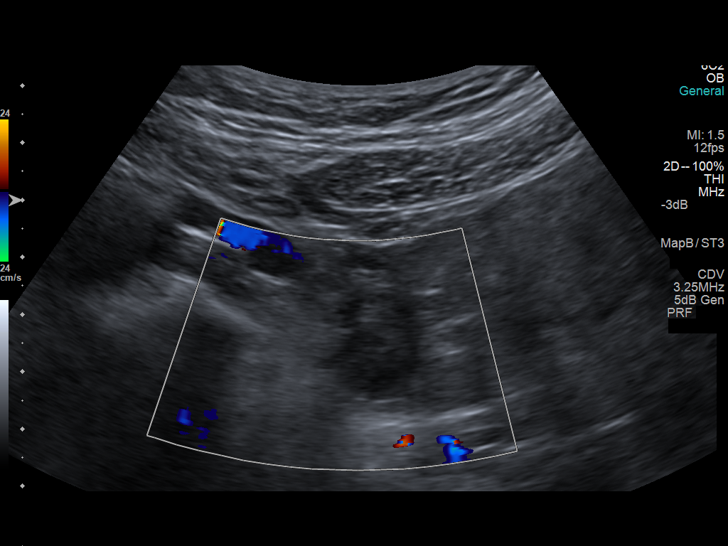
[im 32/66]
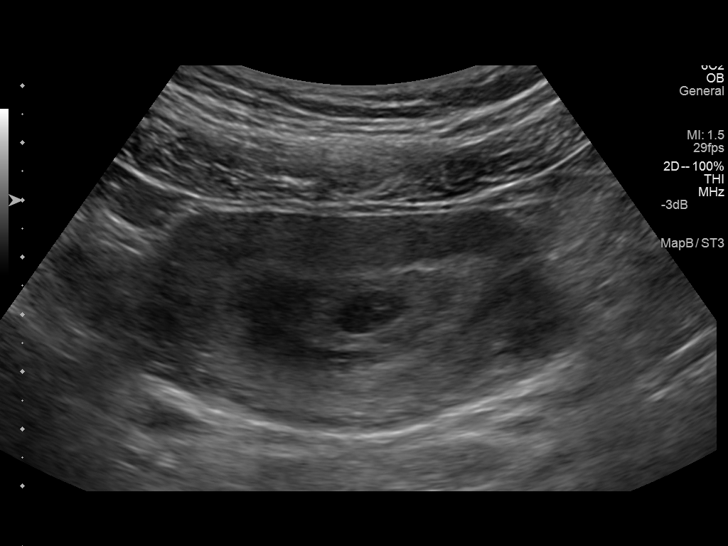
[im 37/66]
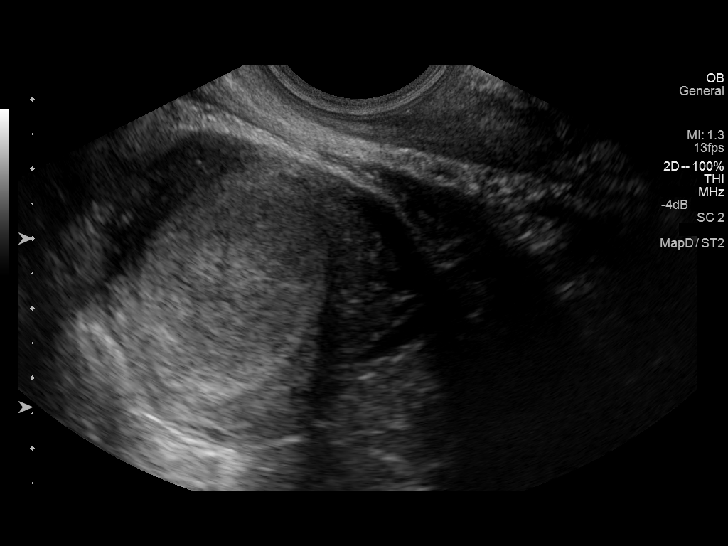
[im 41/66]
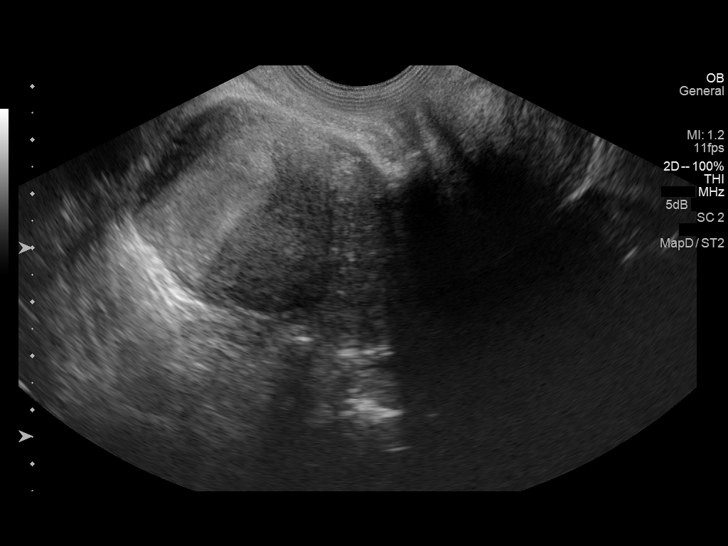
[im 46/66]
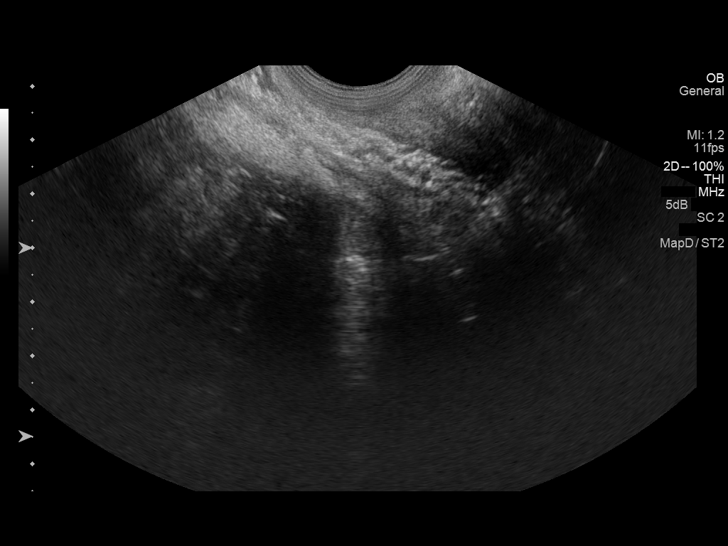
[im 51/66]
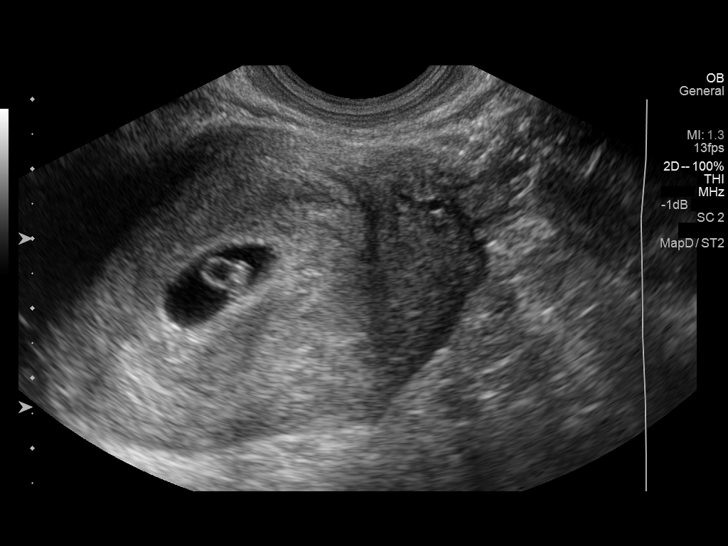
[im 56/66]
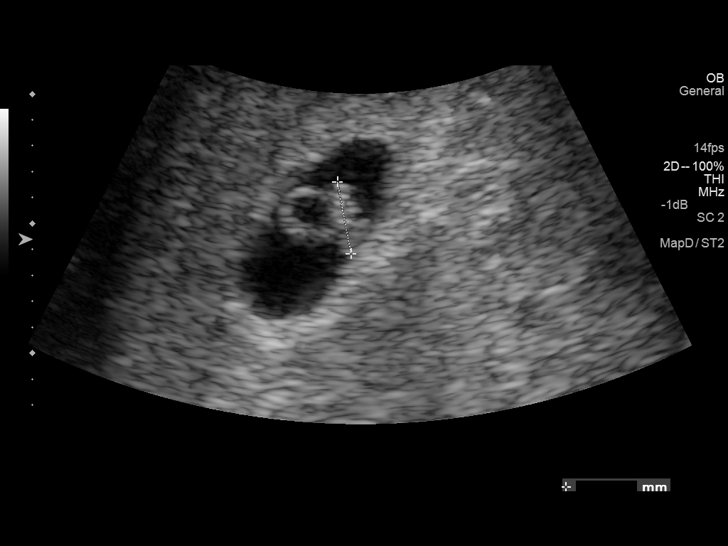
[im 61/66]
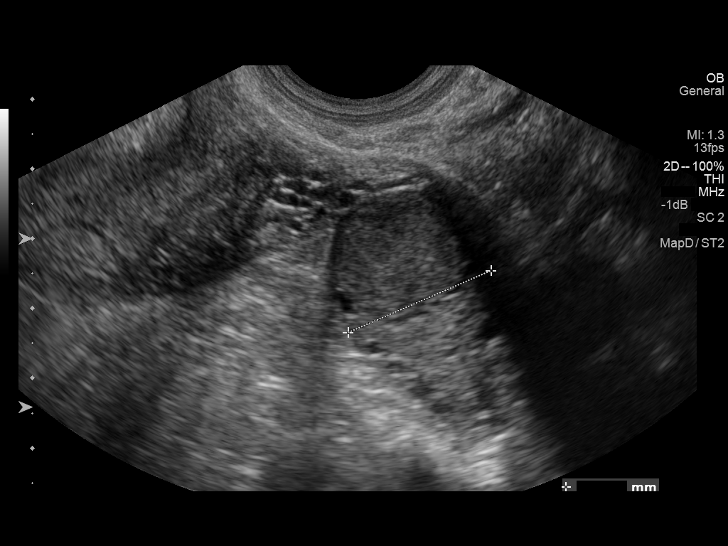
[im 66/66]
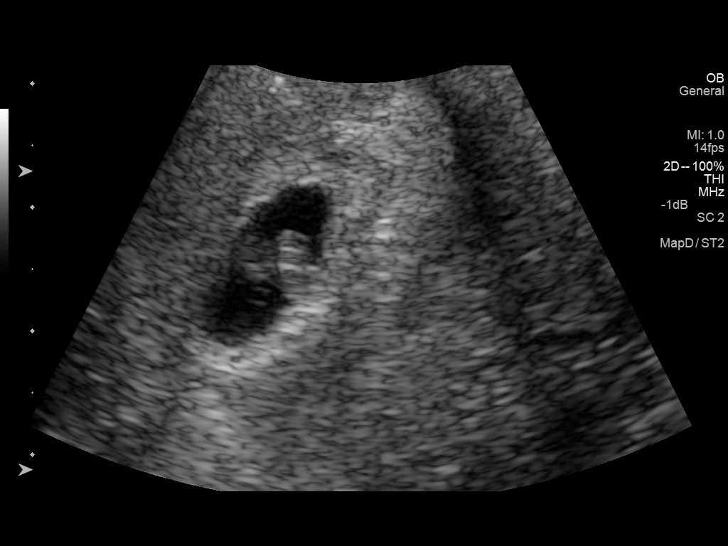

[14 of 28 positions shown; findings below may reference images not displayed]

FINDINGS: Intrauterine gestational sac: Visualized/normal in shape.

Yolk sac:  Visualized.

Embryo:  Visualized.

Cardiac Activity: Detected.

Heart Rate:  130 bpm

CRL:   5.6  mm   6 w 2 d                  US EDC: 06/15/2014

Maternal uterus/adnexae: Unremarkable.
IMPRESSION: Single living intrauterine pregnancy as described. No acute finding.

## 2015-12-12 ENCOUNTER — Ambulatory Visit: Payer: 59 | Admitting: Adult Health

## 2015-12-16 ENCOUNTER — Telehealth: Payer: Self-pay | Admitting: *Deleted

## 2015-12-16 ENCOUNTER — Other Ambulatory Visit: Payer: Self-pay | Admitting: *Deleted

## 2015-12-16 DIAGNOSIS — O3680X0 Pregnancy with inconclusive fetal viability, not applicable or unspecified: Secondary | ICD-10-CM

## 2015-12-16 NOTE — Telephone Encounter (Signed)
[facility]

## 2015-12-17 ENCOUNTER — Emergency Department (HOSPITAL_COMMUNITY)
Admission: EM | Admit: 2015-12-17 | Discharge: 2015-12-18 | Disposition: A | Discharge status: Home / Self Care | Payer: 59 | Attending: Emergency Medicine | Admitting: Emergency Medicine

## 2015-12-17 ENCOUNTER — Encounter (HOSPITAL_COMMUNITY): Payer: Self-pay | Admitting: *Deleted

## 2015-12-17 DIAGNOSIS — Z79899 Other long term (current) drug therapy: Secondary | ICD-10-CM | POA: Insufficient documentation

## 2015-12-17 DIAGNOSIS — R42 Dizziness and giddiness: Secondary | ICD-10-CM | POA: Diagnosis not present

## 2015-12-17 DIAGNOSIS — R102 Pelvic and perineal pain: Secondary | ICD-10-CM | POA: Diagnosis not present

## 2015-12-17 DIAGNOSIS — M25511 Pain in right shoulder: Secondary | ICD-10-CM | POA: Insufficient documentation

## 2015-12-17 DIAGNOSIS — R110 Nausea: Secondary | ICD-10-CM | POA: Insufficient documentation

## 2015-12-17 DIAGNOSIS — R1011 Right upper quadrant pain: Secondary | ICD-10-CM | POA: Insufficient documentation

## 2015-12-17 DIAGNOSIS — R1013 Epigastric pain: Secondary | ICD-10-CM | POA: Diagnosis present

## 2015-12-17 LAB — CBC WITH DIFFERENTIAL/PLATELET
Basophils Absolute: 0.1 10*3/uL (ref 0.0–0.1)
Basophils Relative: 1 %
EOS PCT: 1 %
Eosinophils Absolute: 0.1 10*3/uL (ref 0.0–0.7)
HEMATOCRIT: 38.5 % (ref 36.0–46.0)
Hemoglobin: 12.2 g/dL (ref 12.0–15.0)
LYMPHS ABS: 1.3 10*3/uL (ref 0.7–4.0)
LYMPHS PCT: 17 %
MCH: 27.1 pg (ref 26.0–34.0)
MCHC: 31.7 g/dL (ref 30.0–36.0)
MCV: 85.6 fL (ref 78.0–100.0)
MONOS PCT: 12 %
Monocytes Absolute: 0.9 10*3/uL (ref 0.1–1.0)
NEUTROS ABS: 5.4 10*3/uL (ref 1.7–7.7)
Neutrophils Relative %: 69 %
PLATELETS: 327 10*3/uL (ref 150–400)
RBC: 4.5 MIL/uL (ref 3.87–5.11)
RDW: 14.8 % (ref 11.5–15.5)
WBC: 7.8 10*3/uL (ref 4.0–10.5)

## 2015-12-17 LAB — COMPREHENSIVE METABOLIC PANEL
ALT: 38 U/L (ref 14–54)
AST: 61 U/L — ABNORMAL HIGH (ref 15–41)
Albumin: 4.5 g/dL (ref 3.5–5.0)
Alkaline Phosphatase: 91 U/L (ref 38–126)
Anion gap: 6 (ref 5–15)
BUN: 11 mg/dL (ref 6–20)
CHLORIDE: 103 mmol/L (ref 101–111)
CO2: 27 mmol/L (ref 22–32)
CREATININE: 0.64 mg/dL (ref 0.44–1.00)
Calcium: 9.3 mg/dL (ref 8.9–10.3)
GFR calc non Af Amer: >60 mL/min (ref >60)
Glucose, Bld: 87 mg/dL (ref 65–99)
POTASSIUM: 3.2 mmol/L — AB (ref 3.5–5.1)
Sodium: 136 mmol/L (ref 135–145)
Total Bilirubin: 0.3 mg/dL (ref 0.3–1.2)
Total Protein: 7.6 g/dL (ref 6.5–8.1)

## 2015-12-17 LAB — URINALYSIS, ROUTINE W REFLEX MICROSCOPIC
Bilirubin Urine: NEGATIVE
Glucose, UA: NEGATIVE mg/dL
Hgb urine dipstick: NEGATIVE
KETONES UR: NEGATIVE mg/dL
LEUKOCYTES UA: NEGATIVE
Nitrite: NEGATIVE
PROTEIN: NEGATIVE mg/dL
Specific Gravity, Urine: 1.025 (ref 1.005–1.030)
pH: 5.5 (ref 5.0–8.0)

## 2015-12-17 LAB — HCG, QUANTITATIVE, PREGNANCY: HCG, BETA CHAIN, QUANT, S: 86 m[IU]/mL — AB (ref <5)

## 2015-12-17 LAB — LIPASE, BLOOD: LIPASE: 27 U/L (ref 11–51)

## 2015-12-17 NOTE — ED Notes (Addendum)
[facility], chest, back and shoulder pain. Pt was told she was pregnant and they did a transvaginal ultrasound but could not find a baby. Pt followed up with the health department on Friday and she was told she was around [redacted] weeks pregnant. Pt c/o continued pain in the same areas and was advised by Dr. Rayna SextonFerguson's on call nurse to come back to the ED for her pain symptoms.

## 2015-12-17 NOTE — ED Provider Notes (Signed)
CSN: [id number]     Arrival date & time 12/17/15  2223 History   First MD Initiated Contact with Patient 12/17/15 2234     Chief Complaint  Patient presents with  . Abdominal Pain     (Consider location/radiation/quality/duration/timing/severity/associated sxs/prior Treatment) Patient is a 25 y.o. female presenting with abdominal pain. The history is provided by the patient. No language interpreter was used.  Abdominal Pain Pain location:  Epigastric Pain quality: aching   Pain radiates to:  R shoulder and back Pain severity:  Moderate Onset quality:  Gradual Duration:  4 days Timing:  Constant Progression:  Unchanged Chronicity:  New Context: awakening from sleep   Relieved by: tylenol with codeine helps. Worsened by:  Nothing tried Associated symptoms: nausea   Associated symptoms: no vaginal bleeding and no vaginal discharge   Risk factors: pregnancy    Mickeal SkinnerKayla C Jelley is a 25 y.o. G2 P1 who presents to the ED with abdominal pain that started 4 days ago. [facility] and was told she was pregnant and did an ultrasound that did not show a pregnancy anywhere. They told her her Bhcg was 359 and needed it repeated. She went for follow up at the health department but they told her it was to soon check her level again and told her by her LMP she was about [redacted] weeks pregnant. She has been taking tylenol with Codeine that she was [facility]. Patient reports that now she is having lower abdominal pain that is on the right side as well as the epigastric and right shoulder pain. She has had nausea but no vomiting. She complains of feeling light headed. Patient reports that she was taking Lo Estrin birth control pills but stopped when they told her she was pregnant. Pain got worse today after eating fried chicken.  Past Medical History  Diagnosis Date  . Medical history non-contributory   . Anemia   . Encounter for Nexplanon removal 09/26/2015  .  Contraceptive management 09/26/2015   Past Surgical History  Procedure Laterality Date  . No past surgeries     Family History  Problem Relation Age of Onset  . Cancer Maternal Grandfather     skin   Social History  Substance Use Topics  . Smoking status: Never Smoker   . Smokeless tobacco: Never Used  . Alcohol Use: No   OB History    Gravida Para Term Preterm AB TAB SAB Ectopic Multiple Living   1 1 1       0 1     Review of Systems  Gastrointestinal: Positive for nausea and abdominal pain.  Genitourinary: Positive for pelvic pain. Negative for vaginal bleeding and vaginal discharge.  Neurological: Positive for light-headedness.   All other systems negative   Allergies  Review of patient's allergies indicates no known allergies.  Home Medications   Prior to Admission medications   Medication Sig Start Date End Date Taking? Authorizing Provider  acetaminophen (TYLENOL) 500 MG tablet Take 500 mg by mouth every 6 (six) hours as needed for mild pain or moderate pain.   Yes Historical Provider, MD  acetaminophen-codeine (TYLENOL #3) 300-30 MG tablet Take 1 tablet by mouth every 8 (eight) hours as needed for moderate pain.   Yes Historical Provider, MD   BP 106/76 mmHg  Pulse 110  Temp(Src) 97.6 F (36.4 C) (Oral)  Resp 16  Ht 5\' 7"  (1.702 m)  Wt 61.236 kg  BMI 21.14 kg/m2  SpO2  100%  LMP 11/24/2015 Physical Exam  Constitutional: She is oriented to person, place, and time. She appears well-developed and well-nourished. No distress.  Eyes: EOM are normal.  Neck: Neck supple.  Cardiovascular: Normal rate.   Pulmonary/Chest: Effort normal.  Abdominal: Soft. Bowel sounds are normal. There is tenderness in the epigastric area.  Tenderness is minimal at this time. Patient reports she took her pain mediation just before coming to the ED.  Musculoskeletal: Normal range of motion.  Neurological: She is alert and oriented to person, place, and time. No cranial nerve  deficit.  Skin: Skin is warm and dry.  Psychiatric: She has a normal mood and affect. Her behavior is normal.  Nursing note and vitals reviewed.   ED Course  Procedures (including critical care time) Labs Review Results for orders placed or performed during the hospital encounter of 12/17/15 (from the past 24 hour(s))  CBC with Differential     Status: None   Collection Time: 12/17/15 10:40 PM  Result Value Ref Range   WBC 7.8 4.0 - 10.5 K/uL   RBC 4.50 3.87 - 5.11 MIL/uL   Hemoglobin 12.2 12.0 - 15.0 g/dL   HCT 16.1 09.6 - 04.5 %   MCV 85.6 78.0 - 100.0 fL   MCH 27.1 26.0 - 34.0 pg   MCHC 31.7 30.0 - 36.0 g/dL   RDW 40.9 81.1 - 91.4 %   Platelets 327 150 - 400 K/uL   Neutrophils Relative % 69 %   Neutro Abs 5.4 1.7 - 7.7 K/uL   Lymphocytes Relative 17 %   Lymphs Abs 1.3 0.7 - 4.0 K/uL   Monocytes Relative 12 %   Monocytes Absolute 0.9 0.1 - 1.0 K/uL   Eosinophils Relative 1 %   Eosinophils Absolute 0.1 0.0 - 0.7 K/uL   Basophils Relative 1 %   Basophils Absolute 0.1 0.0 - 0.1 K/uL  Comprehensive metabolic panel     Status: Abnormal   Collection Time: 12/17/15 10:40 PM  Result Value Ref Range   Sodium 136 135 - 145 mmol/L   Potassium 3.2 (L) 3.5 - 5.1 mmol/L   Chloride 103 101 - 111 mmol/L   CO2 27 22 - 32 mmol/L   Glucose, Bld 87 65 - 99 mg/dL   BUN 11 6 - 20 mg/dL   Creatinine, Ser 7.82 0.44 - 1.00 mg/dL   Calcium 9.3 8.9 - 95.6 mg/dL   Total Protein 7.6 6.5 - 8.1 g/dL   Albumin 4.5 3.5 - 5.0 g/dL   AST 61 (H) 15 - 41 U/L   ALT 38 14 - 54 U/L   Alkaline Phosphatase 91 38 - 126 U/L   Total Bilirubin 0.3 0.3 - 1.2 mg/dL   GFR calc non Af Amer >60 >60 mL/min   GFR calc Af Amer >60 >60 mL/min   Anion gap 6 5 - 15  Lipase, blood     Status: None   Collection Time: 12/17/15 10:40 PM  Result Value Ref Range   Lipase 27 11 - 51 U/L  hCG, quantitative, pregnancy     Status: Abnormal   Collection Time: 12/17/15 10:40 PM  Result Value Ref Range   hCG, Beta Chain,  Quant, S 86 (H) <5 mIU/mL  Urinalysis, Routine w reflex microscopic (not at [facility])     Status: None   Collection Time: 12/17/15 10:40 PM  Result Value Ref Range   Color, Urine YELLOW YELLOW   APPearance CLEAR CLEAR   Specific Gravity, Urine 1.025 1.005 -  1.030   pH 5.5 5.0 - 8.0   Glucose, UA NEGATIVE NEGATIVE mg/dL   Hgb urine dipstick NEGATIVE NEGATIVE   Bilirubin Urine NEGATIVE NEGATIVE   Ketones, ur NEGATIVE NEGATIVE mg/dL   Protein, ur NEGATIVE NEGATIVE mg/dL   Nitrite NEGATIVE NEGATIVE   Leukocytes, UA NEGATIVE NEGATIVE     Imaging Review No results found. I have personally reviewed and evaluated these images and lab results as part of my medical decision-making.  I discussed this case with Dr. Despina Hidden, patient to keep her appointment in the office as scheduled in 2 days. She will return here at 9am for abdominal ultrasound.  Discussed with patient do not eat or drink until after the ultrasound.   MDM  25 y.o. female with epigastric and RUQ abdominal pain that worsened today after eating fried chicken. Stable for d/c without vomiting or dehydration. She will return at 9 am for ultrasound. Discussed with the patient lab results and the Bhcg level dropping. Discussed slightly elevated AST and possible gall bladder problems. Patient voices understanding and agrees with plan.   Final diagnoses:  RUQ abdominal pain       [facility], NP 12/18/15 [id number]  Eber Hong, MD 12/21/15 [phone]

## 2015-12-17 NOTE — ED Notes (Signed)
Pt reports  Having to been to other area hospital as well as health department for RUQ pain into her back as well as lower abd pain- She was told she was [redacted] weeks pregnant had a trans vaginal ultrasound and reports that they saw nothing. She also reports continued pain. G2,P1

## 2015-12-18 ENCOUNTER — Ambulatory Visit (HOSPITAL_COMMUNITY)
Admission: RE | Admit: 2015-12-18 | Discharge: 2015-12-18 | Disposition: A | Discharge status: Home / Self Care | Payer: 59 | Source: Ambulatory Visit | Attending: Emergency Medicine | Admitting: Emergency Medicine

## 2015-12-18 ENCOUNTER — Other Ambulatory Visit (HOSPITAL_COMMUNITY): Payer: Self-pay | Admitting: Emergency Medicine

## 2015-12-18 DIAGNOSIS — R1011 Right upper quadrant pain: Secondary | ICD-10-CM

## 2015-12-18 MED ORDER — FAMOTIDINE IN NACL 20-0.9 MG/50ML-% IV SOLN
20.0000 mg | Freq: Once | INTRAVENOUS | Status: AC
  Administered 2015-12-18: 20 mg via INTRAVENOUS
  Filled 2015-12-18: qty 50

## 2015-12-18 NOTE — ED Notes (Signed)
Gallbladder ultrasound appointment scheduled for 12/18/2015 at 9:00 am, pt is to remain NPO after midnight,

## 2015-12-18 NOTE — ED Provider Notes (Signed)
Ms. Melanie Price is a 25 year old female who was seen in the emergency department on last evening with abdominal pain. She is believed to be about [redacted] weeks pregnant. She was having right upper quadrant abdominal pain, and an abdominal ultrasound was ordered for her.  The abdominal ultrasound shows a normal gallbladder, normal common bile duct, normal liver. I've discussed these findings with the patient. We also discussed the importance of her keeping her appointment with the GYN physician on tomorrow. Patient was advised [facility] if any excessive bleeding, or to return to the emergency department if any emergent situations before her appointment tomorrow with the GYN specialist. The patient acknowledged understanding of these instructions, and is in agreement.  Melanie QualeHobson [facility], PA-C 12/18/15 1016  Donnetta HutchingBrian Cook, MD 12/18/15 [phone]

## 2015-12-18 NOTE — ED Notes (Signed)
Pt discharged per instructions. Verbalizes understanding of return for worsening symptoms, as well as return for ultrasound in am at 0845 and npo until then. To exit ambulating erect with family member

## 2015-12-18 NOTE — Discharge Instructions (Signed)
Return at 9 am for ultrasound of your abdomen. Do not eat or drink anything 8 hours before the study.

## 2015-12-19 ENCOUNTER — Encounter: Payer: Self-pay | Admitting: Adult Health

## 2015-12-19 ENCOUNTER — Other Ambulatory Visit: Payer: Self-pay | Admitting: Obstetrics & Gynecology

## 2015-12-19 ENCOUNTER — Ambulatory Visit (INDEPENDENT_AMBULATORY_CARE_PROVIDER_SITE_OTHER): Payer: 59 | Admitting: Adult Health

## 2015-12-19 ENCOUNTER — Other Ambulatory Visit: Payer: 59

## 2015-12-19 VITALS — BP 100/62 | HR 80 | Ht 67.0 in | Wt 153.0 lb

## 2015-12-19 DIAGNOSIS — Z3201 Encounter for pregnancy test, result positive: Secondary | ICD-10-CM

## 2015-12-19 DIAGNOSIS — O3680X0 Pregnancy with inconclusive fetal viability, not applicable or unspecified: Secondary | ICD-10-CM

## 2015-12-19 DIAGNOSIS — O209 Hemorrhage in early pregnancy, unspecified: Secondary | ICD-10-CM

## 2015-12-19 DIAGNOSIS — O200 Threatened abortion: Secondary | ICD-10-CM

## 2015-12-19 HISTORY — DX: Hemorrhage in early pregnancy, unspecified: O20.9

## 2015-12-19 LAB — POCT URINE PREGNANCY: PREG TEST UR: POSITIVE — AB

## 2015-12-19 NOTE — Progress Notes (Signed)
Subjective:     Patient ID: Mickeal SkinnerKayla C Weedman, female   DOB: 07/25/1991, 25 y.o.   MRN: [id number]  HPI Dorathy DaftKayla is a 25 year old white female, in for follow up of going to ER 5/13 for RUQ pain and was found to be pregnant QHCG86, had lite vaginal bleeding yesterday, now heavy and passed purple like tissue.She had nexplanon removed in February. Her blood type is O+ in CHL.   Review of Systems Patient denies any headaches, hearing loss, fatigue, blurred vision, shortness of breath, chest pain, abdominal pain, problems with bowel movements, urination, or intercourse. No joint pain or mood swings.+vaginal bleeding Reviewed past medical,surgical, social and family history. Reviewed medications and allergies.     Objective:   Physical Exam BP 100/62 mmHg  Pulse 80  Ht 5\' 7"  (1.702 m)  Wt 153 lb (69.4 kg)  BMI 23.96 kg/m2  LMP 11/24/2015  Breastfeeding? No UPT litely +, Skin warm and dry. Neck: mid line trachea, normal thyroid, good ROM, no lymphadenopathy noted. Lungs: clear to ausculation bilaterally. Cardiovascular: regular rate and rhythm.Abdomen soft and non tender,no HSM noted, [facility]    Assessment:     Bleeding in early pregnancy  UPT +, lite    Plan:      [facility]  Will talk in am when labs back No sex or heavy lifting

## 2015-12-19 NOTE — Patient Instructions (Signed)
No sex no heavy lifting, will talk in am  Follow up prn

## 2015-12-20 ENCOUNTER — Telehealth: Payer: Self-pay | Admitting: Adult Health

## 2015-12-20 LAB — HCG, BETA SUBUNIT, QN (SERIAL): HCG, BETA CHAIN, QUANT, S: 34 m[IU]/mL

## 2015-12-20 NOTE — Telephone Encounter (Addendum)
Left message that [facility], keep appt tomorrow

## 2015-12-21 ENCOUNTER — Ambulatory Visit (INDEPENDENT_AMBULATORY_CARE_PROVIDER_SITE_OTHER): Payer: 59 | Admitting: Adult Health

## 2015-12-21 ENCOUNTER — Encounter: Payer: Self-pay | Admitting: Adult Health

## 2015-12-21 VITALS — BP 100/70 | HR 80 | Ht 67.0 in | Wt 153.0 lb

## 2015-12-21 DIAGNOSIS — O039 Complete or unspecified spontaneous abortion without complication: Secondary | ICD-10-CM | POA: Diagnosis not present

## 2015-12-21 HISTORY — DX: Complete or unspecified spontaneous abortion without complication: O03.9

## 2015-12-21 NOTE — Patient Instructions (Signed)
Take OTC prenatal vitamins Follow up in 2 weeks  Miscarriage A miscarriage is the sudden loss of an unborn baby (fetus) before the 20th week of pregnancy. Most miscarriages happen in the first 3 months of pregnancy. Sometimes, it happens before a woman even knows she is pregnant. A miscarriage is also called a "spontaneous miscarriage" or "early pregnancy loss." Having a miscarriage can be an emotional experience. Talk with your caregiver about any questions you may have about miscarrying, the grieving process, and your future pregnancy plans. CAUSES   Problems with the fetal chromosomes that make it impossible for the baby to develop normally. Problems with the baby's genes or chromosomes are most often the result of errors that occur, by chance, as the embryo divides and grows. The problems are not inherited from the parents.  Infection of the cervix or uterus.   Hormone problems.   Problems with the cervix, such as having an incompetent cervix. This is when the tissue in the cervix is not strong enough to hold the pregnancy.   Problems with the uterus, such as an abnormally shaped uterus, uterine fibroids, or congenital abnormalities.   Certain medical conditions.   Smoking, drinking alcohol, or taking illegal drugs.   Trauma.  Often, the cause of a miscarriage is unknown.  SYMPTOMS   Vaginal bleeding or spotting, with or without cramps or pain.  Pain or cramping in the abdomen or lower back.  Passing fluid, tissue, or blood clots from the vagina. DIAGNOSIS  Your caregiver will perform a physical exam. You may also have an ultrasound to confirm the miscarriage. Blood or urine tests may also be ordered. TREATMENT   Sometimes, treatment is not necessary if you naturally pass all the fetal tissue that was in the uterus. If some of the fetus or placenta remains in the body (incomplete miscarriage), tissue left behind may become infected and must be removed. Usually, a dilation  and curettage (D and C) procedure is performed. During a D and C procedure, the cervix is widened (dilated) and any remaining fetal or placental tissue is gently removed from the uterus.  Antibiotic medicines are prescribed if there is an infection. Other medicines may be given to reduce the size of the uterus (contract) if there is a lot of bleeding.  If you have Rh negative blood and your baby was Rh positive, you will need a Rh immunoglobulin shot. This shot will protect any future baby from having Rh blood problems in future pregnancies. HOME CARE INSTRUCTIONS   Your caregiver may order bed rest or may allow you to continue light activity. Resume activity as directed by your caregiver.  Have someone help with home and family responsibilities during this time.   Keep track of the number of sanitary pads you use each day and how soaked (saturated) they are. Write down this information.   Do not use tampons. Do not douche or have sexual intercourse until approved by your caregiver.   Only take over-the-counter or prescription medicines for pain or discomfort as directed by your caregiver.   Do not take aspirin. Aspirin can cause bleeding.   Keep all follow-up appointments with your caregiver.   If you or your partner have problems with grieving, talk to your caregiver or seek counseling to help cope with the pregnancy loss. Allow enough time to grieve before trying to get pregnant again.  SEEK IMMEDIATE MEDICAL CARE IF:   You have severe cramps or pain in your back or abdomen.  You  have a fever.  You pass large blood clots (walnut-sized or larger) ortissue from your vagina. Save any tissue for your caregiver to inspect.   Your bleeding increases.   You have a thick, bad-smelling vaginal discharge.  You become lightheaded, weak, or you faint.   You have chills.  MAKE SURE YOU:  Understand these instructions.  Will watch your condition.  Will get help right away  if you are not doing well or get worse.   This information is not intended to replace advice given to you by your health care provider. Make sure you discuss any questions you have with your health care provider.   Document Released: 01/16/2001 Document Revised: 11/17/2012 Document Reviewed: 09/11/2011 Elsevier Interactive Patient Education [employer]

## 2015-12-21 NOTE — Progress Notes (Signed)
Subjective:     Patient ID: Melanie Price, female   DOB: 12/25/1990, 25 y.o.   MRN: [id number]  HPI Dorathy DaftKayla is a 25 year old white female, has declining QHCG and has started bleeding with some cramps, will check QHCG today, blood type O+.  Review of Systems Patient denies any headaches, hearing loss, fatigue, blurred vision, shortness of breath, chest pain, problems with bowel movements, urination, or intercourse. No joint pain or mood swings.See HPI for positives. Reviewed past medical,surgical, social and family history. Reviewed medications and allergies.     Objective:   Physical Exam BP 100/70 mmHg  Pulse 80  Ht 5\' 7"  (1.702 m)  Wt 153 lb (69.4 kg)  BMI 23.96 kg/m2  LMP 11/24/2015 Skin warm and dry. Lungs: clear to ausculation bilaterally. Cardiovascular: regular rate and rhythm.Abdomen soft and non tender, pelvic deferred.   Discussed following QHCG and she agrees, will take OTC prenatal and use tylenol if needed for cramps.  Assessment:     Miscarriage     Plan:     [facility], will talk in am Follow up in 2 weeks

## 2015-12-22 ENCOUNTER — Telehealth: Payer: Self-pay | Admitting: Adult Health

## 2015-12-22 LAB — BETA HCG QUANT (REF LAB): hCG Quant: 15 m[IU]/mL

## 2015-12-22 NOTE — Telephone Encounter (Signed)
[facility] will recheck as scheduled

## 2015-12-26 ENCOUNTER — Ambulatory Visit: Payer: 59 | Admitting: Adult Health

## 2016-01-04 ENCOUNTER — Ambulatory Visit: Payer: 59 | Admitting: Adult Health

## 2016-11-12 ENCOUNTER — Other Ambulatory Visit: Payer: Self-pay | Admitting: Adult Health

## 2016-11-20 ENCOUNTER — Encounter: Payer: Self-pay | Admitting: Adult Health

## 2016-11-20 ENCOUNTER — Ambulatory Visit (INDEPENDENT_AMBULATORY_CARE_PROVIDER_SITE_OTHER): Payer: 59 | Admitting: Adult Health

## 2016-11-20 ENCOUNTER — Other Ambulatory Visit (HOSPITAL_COMMUNITY)
Admission: RE | Admit: 2016-11-20 | Discharge: 2016-11-20 | Disposition: A | Discharge status: Home / Self Care | Payer: 59 | Source: Ambulatory Visit | Attending: Adult Health | Admitting: Adult Health

## 2016-11-20 VITALS — BP 112/66 | HR 89 | Ht 68.0 in | Wt 173.0 lb

## 2016-11-20 DIAGNOSIS — Z01419 Encounter for gynecological examination (general) (routine) without abnormal findings: Secondary | ICD-10-CM | POA: Diagnosis not present

## 2016-11-20 DIAGNOSIS — Z3041 Encounter for surveillance of contraceptive pills: Secondary | ICD-10-CM

## 2016-11-20 MED ORDER — NORETHIN-ETH ESTRAD-FE BIPHAS 1 MG-10 MCG / 10 MCG PO TABS: 1.0000 | ORAL_TABLET | Freq: Every day | ORAL | 12 refills | Status: DC

## 2016-11-20 NOTE — Progress Notes (Signed)
Patient ID: Melanie Price, female   DOB: July 31, 1991, 26 y.o.   MRN: [id number] History of Present Illness:  Melanie Price is a 26 year old white female, married in for a well woman gyn exam and pap and needs OCs refilled.   Current Medications, Allergies, Past Medical History, Past Surgical History, Family History and Social History were reviewed in [name] record.     Review of Systems: Patient denies any headaches, hearing loss, fatigue, blurred vision, shortness of breath, chest pain, abdominal pain, problems with bowel movements, urination, or intercourse. No joint pain or mood swings.She is happy with her pills.    Physical Exam:BP 112/66 (BP Location: Right Arm, Patient Position: Sitting, Cuff Size: Normal)   Pulse 89   Ht  (1.727 m)   Wt 173 lb (78.5 kg)   LMP 10/31/2016   Breastfeeding? No   BMI 26.30 kg/m  General:  Well developed, well nourished, no acute distress Skin:  Warm and dry Neck:  Midline trachea, normal thyroid, good ROM, no lymphadenopathy Lungs; Clear to auscultation bilaterally Breast:  No dominant palpable mass, retraction, or nipple discharge Cardiovascular: Regular rate and rhythm Abdomen:  Soft, non tender, no hepatosplenomegaly Pelvic:  External genitalia is normal in appearance, no lesions.  The vagina is normal in appearance. Urethra has no lesions or masses. The cervix is bulbous. Pap with CG/CHL performed. Uterus is felt to be normal size, shape, and contour.  No adnexal masses or tenderness noted.Bladder is non tender, no masses felt. Extremities/musculoskeletal:  No swelling or varicosities noted, no clubbing or cyanosis Psych:  No mood changes, alert and cooperative,seems happy PHQ 9 score 21.She sees theraptist, Lanette Hampshire, she is not on meds, and denies any suicidal or homicidal  Ideations.  Impression: 1. Encounter for gynecological examination with Papanicolaou smear of cervix   2. Encounter for surveillance of  contraceptive pills       Plan: Rx lo loestrin disp 1 pack take 1 daily with 12 refills Physical in 1 year Pap in 3 if normal

## 2016-11-21 LAB — CYTOLOGY - PAP
Chlamydia: NEGATIVE
DIAGNOSIS: NEGATIVE
Neisseria Gonorrhea: NEGATIVE

## 2017-03-05 ENCOUNTER — Ambulatory Visit (INDEPENDENT_AMBULATORY_CARE_PROVIDER_SITE_OTHER): Payer: 59 | Admitting: Adult Health

## 2017-03-05 ENCOUNTER — Encounter: Payer: Self-pay | Admitting: Adult Health

## 2017-03-05 VITALS — BP 110/80 | HR 78 | Ht 67.0 in | Wt 179.0 lb

## 2017-03-05 DIAGNOSIS — Z3201 Encounter for pregnancy test, result positive: Secondary | ICD-10-CM | POA: Diagnosis not present

## 2017-03-05 DIAGNOSIS — Z3490 Encounter for supervision of normal pregnancy, unspecified, unspecified trimester: Secondary | ICD-10-CM

## 2017-03-05 DIAGNOSIS — O3680X0 Pregnancy with inconclusive fetal viability, not applicable or unspecified: Secondary | ICD-10-CM

## 2017-03-05 DIAGNOSIS — N926 Irregular menstruation, unspecified: Secondary | ICD-10-CM | POA: Diagnosis not present

## 2017-03-05 LAB — POCT URINE PREGNANCY: Preg Test, Ur: POSITIVE — AB

## 2017-03-05 MED ORDER — ADULT ONE DAILY GUMMIES PO CHEW: CHEWABLE_TABLET | ORAL | Status: DC

## 2017-03-05 NOTE — Patient Instructions (Signed)
First Trimester of Pregnancy The first trimester of pregnancy is from week 1 until the end of week 13 (months 1 through 3). A week after a sperm fertilizes an egg, the egg will implant on the wall of the uterus. This embryo will begin to develop into a baby. Genes from you and your partner will form the baby. The female genes will determine whether the baby will be a boy or a girl. At 6-8 weeks, the eyes and face will be formed, and the heartbeat can be seen on ultrasound. At the end of 12 weeks, all the baby's organs will be formed. Now that you are pregnant, you will want to do everything you can to have a healthy baby. Two of the most important things are to get good prenatal care and to follow your health care provider's instructions. Prenatal care is all the medical care you receive before the baby's birth. This care will help prevent, find, and treat any problems during the pregnancy and childbirth. Body changes during your first trimester Your body goes through many changes during pregnancy. The changes vary from woman to woman.  You may gain or lose a couple of pounds at first.  You may feel sick to your stomach (nauseous) and you may throw up (vomit). If the vomiting is uncontrollable, call your health care provider.  You may tire easily.  You may develop headaches that can be relieved by medicines. All medicines should be approved by your health care provider.  You may urinate more often. Painful urination may mean you have a bladder infection.  You may develop heartburn as a result of your pregnancy.  You may develop constipation because certain hormones are causing the muscles that push stool through your intestines to slow down.  You may develop hemorrhoids or swollen veins (varicose veins).  Your breasts may begin to grow larger and become tender. Your nipples may stick out more, and the tissue that surrounds them (areola) may become darker.  Your gums may bleed and may be  sensitive to brushing and flossing.  Dark spots or blotches (chloasma, mask of pregnancy) may develop on your face. This will likely fade after the baby is born.  Your menstrual periods will stop.  You may have a loss of appetite.  You may develop cravings for certain kinds of food.  You may have changes in your emotions from day to day, such as being excited to be pregnant or being concerned that something may go wrong with the pregnancy and baby.  You may have more vivid and strange dreams.  You may have changes in your hair. These can include thickening of your hair, rapid growth, and changes in texture. Some women also have hair loss during or after pregnancy, or hair that feels dry or thin. Your hair will most likely return to normal after your baby is born.  What to expect at prenatal visits During a routine prenatal visit:  You will be weighed to make sure you and the baby are growing normally.  Your blood pressure will be taken.  Your abdomen will be measured to track your baby's growth.  The fetal heartbeat will be listened to between weeks 10 and 14 of your pregnancy.  Test results from any previous visits will be discussed.  Your health care provider may ask you:  How you are feeling.  If you are feeling the baby move.  If you have had any abnormal symptoms, such as leaking fluid, bleeding, severe headaches,   or abdominal cramping.  If you are using any tobacco products, including cigarettes, chewing tobacco, and electronic cigarettes.  If you have any questions.  Other tests that may be performed during your first trimester include:  Blood tests to find your blood type and to check for the presence of any previous infections. The tests will also be used to check for low iron levels (anemia) and protein on red blood cells (Rh antibodies). Depending on your risk factors, or if you previously had diabetes during pregnancy, you may have tests to check for high blood  sugar that affects pregnant women (gestational diabetes).  Urine tests to check for infections, diabetes, or protein in the urine.  An ultrasound to confirm the proper growth and development of the baby.  Fetal screens for spinal cord problems (spina bifida) and Down syndrome.  HIV (human immunodeficiency virus) testing. Routine prenatal testing includes screening for HIV, unless you choose not to have this test.  You may need other tests to make sure you and the baby are doing well.  Follow these instructions at home: Medicines  Follow your health care provider's instructions regarding medicine use. Specific medicines may be either safe or unsafe to take during pregnancy.  Take a prenatal vitamin that contains at least 600 micrograms (mcg) of folic acid.  If you develop constipation, try taking a stool softener if your health care provider approves. Eating and drinking  Eat a balanced diet that includes fresh fruits and vegetables, whole grains, good sources of protein such as meat, eggs, or tofu, and low-fat dairy. Your health care provider will help you determine the amount of weight gain that is right for you.  Avoid raw meat and uncooked cheese. These carry germs that can cause birth defects in the baby.  Eating four or five small meals rather than three large meals a day may help relieve nausea and vomiting. If you start to feel nauseous, eating a few soda crackers can be helpful. Drinking liquids between meals, instead of during meals, also seems to help ease nausea and vomiting.  Limit foods that are high in fat and processed sugars, such as fried and sweet foods.  To prevent constipation: ? Eat foods that are high in fiber, such as fresh fruits and vegetables, whole grains, and beans. ? Drink enough fluid to keep your urine clear or pale yellow. Activity  Exercise only as directed by your health care provider. Most women can continue their usual exercise routine during  pregnancy. Try to exercise for 30 minutes at least 5 days a week. Exercising will help you: ? Control your weight. ? Stay in shape. ? Be prepared for labor and delivery.  Experiencing pain or cramping in the lower abdomen or lower back is a good sign that you should stop exercising. Check with your health care provider before continuing with normal exercises.  Try to avoid standing for long periods of time. Move your legs often if you must stand in one place for a long time.  Avoid heavy lifting.  Wear low-heeled shoes and practice good posture.  You may continue to have sex unless your health care provider tells you not to. Relieving pain and discomfort  Wear a good support bra to relieve breast tenderness.  Take warm sitz baths to soothe any pain or discomfort caused by hemorrhoids. Use hemorrhoid cream if your health care provider approves.  Rest with your legs elevated if you have leg cramps or low back pain.  If you develop   varicose veins in your legs, wear support hose. Elevate your feet for 15 minutes, 3-4 times a day. Limit salt in your diet. Prenatal care  Schedule your prenatal visits by the twelfth week of pregnancy. They are usually scheduled monthly at first, then more often in the last 2 months before delivery.  Write down your questions. Take them to your prenatal visits.  Keep all your prenatal visits as told by your health care provider. This is important. Safety  Wear your seat belt at all times when driving.  Make a list of emergency phone numbers, including numbers for family, friends, the hospital, and police and fire departments. General instructions  Ask your health care provider for a referral to a local prenatal education class. Begin classes no later than the beginning of month 6 of your pregnancy.  Ask for help if you have counseling or nutritional needs during pregnancy. Your health care provider can offer advice or refer you to specialists for help  with various needs.  Do not use hot tubs, steam rooms, or saunas.  Do not douche or use tampons or scented sanitary pads.  Do not cross your legs for long periods of time.  Avoid cat litter boxes and soil used by cats. These carry germs that can cause birth defects in the baby and possibly loss of the fetus by miscarriage or stillbirth.  Avoid all smoking, herbs, alcohol, and medicines not prescribed by your health care provider. Chemicals in these products affect the formation and growth of the baby.  Do not use any products that contain nicotine or tobacco, such as cigarettes and e-cigarettes. If you need help quitting, ask your health care provider. You may receive counseling support and other resources to help you quit.  Schedule a dentist appointment. At home, brush your teeth with a soft toothbrush and be gentle when you floss. Contact a health care provider if:  You have dizziness.  You have mild pelvic cramps, pelvic pressure, or nagging pain in the abdominal area.  You have persistent nausea, vomiting, or diarrhea.  You have a bad smelling vaginal discharge.  You have pain when you urinate.  You notice increased swelling in your face, hands, legs, or ankles.  You are exposed to fifth disease or chickenpox.  You are exposed to German measles (rubella) and have never had it. Get help right away if:  You have a fever.  You are leaking fluid from your vagina.  You have spotting or bleeding from your vagina.  You have severe abdominal cramping or pain.  You have rapid weight gain or loss.  You vomit blood or material that looks like coffee grounds.  You develop a severe headache.  You have shortness of breath.  You have any kind of trauma, such as from a fall or a car accident. Summary  The first trimester of pregnancy is from week 1 until the end of week 13 (months 1 through 3).  Your body goes through many changes during pregnancy. The changes vary from  woman to woman.  You will have routine prenatal visits. During those visits, your health care provider will examine you, discuss any test results you may have, and talk with you about how you are feeling. This information is not intended to replace advice given to you by your health care provider. Make sure you discuss any questions you have with your health care provider. Document Released: 07/17/2001 Document Revised: 07/04/2016 Document Reviewed: 07/04/2016 Elsevier Interactive Patient Education  2017 [employer]   [employer]  

## 2017-03-05 NOTE — Progress Notes (Signed)
Subjective:     Patient ID: Melanie Price, female   DOB: 11/04/1990, 26 y.o.   MRN: [id number]  HPI Melanie Price is a 26 year old white female in for UPT, she had 6+HPT after missing a  period.She had stopped lo loestrin in about May, they made her sick.   Review of Systems +missed period with +HPT Reviewed past medical,surgical, social and family history. Reviewed medications and allergies.     Objective:   Physical Exam BP 110/80 (BP Location: Left Arm, Patient Position: Sitting, Cuff Size: Small)   Pulse 78   Ht 5\' 7"  (1.702 m)   Wt 179 lb (81.2 kg)   LMP 01/30/2017   BMI 28.04 kg/m UPT+, about 4+6 weeks by LMP, with EDD 11/07/17, Skin warm and dry. Neck: mid line trachea, normal thyroid, good ROM, no lymphadenopathy noted. Lungs: clear to ausculation bilaterally. Cardiovascular: regular rate and rhythm.Abdomen is soft and non tender, PHQ 2 score 0.  Discussed [facility] and about call service. Will check QHCG and schedule US.     Assessment:     1. Pregnancy test positive   2. Pregnancy, unspecified gestational age   433. Encounter to determine fetal viability of pregnancy, single or unspecified fetus       Plan:     Ok to take gummies Check QHCG Return in 2 weeks for dating US Review handouts on first trimester and by [facility]

## 2017-03-06 ENCOUNTER — Telehealth: Payer: Self-pay | Admitting: *Deleted

## 2017-03-06 LAB — BETA HCG QUANT (REF LAB): HCG QUANT: 36 m[IU]/mL

## 2017-03-06 NOTE — Telephone Encounter (Signed)
Patient informed of quant level 3-4 weeks and can repeat if desired. Patient states she would like to have redraw on Friday. Will put on schedule.

## 2017-03-07 ENCOUNTER — Other Ambulatory Visit: Payer: Self-pay | Admitting: Adult Health

## 2017-03-07 ENCOUNTER — Emergency Department (HOSPITAL_COMMUNITY): Payer: 59

## 2017-03-07 ENCOUNTER — Telehealth: Payer: Self-pay | Admitting: *Deleted

## 2017-03-07 ENCOUNTER — Emergency Department (HOSPITAL_COMMUNITY)
Admission: EM | Admit: 2017-03-07 | Discharge: 2017-03-07 | Disposition: A | Discharge status: Home / Self Care | Payer: 59 | Attending: Emergency Medicine | Admitting: Emergency Medicine

## 2017-03-07 ENCOUNTER — Encounter (HOSPITAL_COMMUNITY): Payer: Self-pay | Admitting: *Deleted

## 2017-03-07 DIAGNOSIS — R102 Pelvic and perineal pain: Secondary | ICD-10-CM | POA: Insufficient documentation

## 2017-03-07 DIAGNOSIS — Z3490 Encounter for supervision of normal pregnancy, unspecified, unspecified trimester: Secondary | ICD-10-CM

## 2017-03-07 DIAGNOSIS — O26899 Other specified pregnancy related conditions, unspecified trimester: Secondary | ICD-10-CM

## 2017-03-07 DIAGNOSIS — N898 Other specified noninflammatory disorders of vagina: Secondary | ICD-10-CM | POA: Diagnosis not present

## 2017-03-07 DIAGNOSIS — Z3A01 Less than 8 weeks gestation of pregnancy: Secondary | ICD-10-CM | POA: Diagnosis not present

## 2017-03-07 LAB — CBC WITH DIFFERENTIAL/PLATELET
Basophils Absolute: 0 10*3/uL (ref 0.0–0.1)
Basophils Relative: 0 %
EOS ABS: 0.1 10*3/uL (ref 0.0–0.7)
Eosinophils Relative: 1 %
HEMATOCRIT: 38.3 % (ref 36.0–46.0)
HEMOGLOBIN: 12.2 g/dL (ref 12.0–15.0)
LYMPHS ABS: 1.9 10*3/uL (ref 0.7–4.0)
Lymphocytes Relative: 19 %
MCH: 26.1 pg (ref 26.0–34.0)
MCHC: 31.9 g/dL (ref 30.0–36.0)
MCV: 82 fL (ref 78.0–100.0)
MONOS PCT: 7 %
Monocytes Absolute: 0.7 10*3/uL (ref 0.1–1.0)
NEUTROS PCT: 73 %
Neutro Abs: 7.2 10*3/uL (ref 1.7–7.7)
Platelets: 385 10*3/uL (ref 150–400)
RBC: 4.67 MIL/uL (ref 3.87–5.11)
RDW: 14.8 % (ref 11.5–15.5)
WBC: 10 10*3/uL (ref 4.0–10.5)

## 2017-03-07 LAB — ABO/RH: ABO/RH(D): O POS

## 2017-03-07 LAB — URINALYSIS, ROUTINE W REFLEX MICROSCOPIC
Bilirubin Urine: NEGATIVE
GLUCOSE, UA: NEGATIVE mg/dL
HGB URINE DIPSTICK: NEGATIVE
Ketones, ur: NEGATIVE mg/dL
NITRITE: NEGATIVE
PH: 7 (ref 5.0–8.0)
PROTEIN: NEGATIVE mg/dL
SPECIFIC GRAVITY, URINE: 1.018 (ref 1.005–1.030)

## 2017-03-07 LAB — HCG, QUANTITATIVE, PREGNANCY: HCG, BETA CHAIN, QUANT, S: 88 m[IU]/mL — AB (ref <5)

## 2017-03-07 LAB — WET PREP, GENITAL
CLUE CELLS WET PREP: NONE SEEN
SPERM: NONE SEEN
Trich, Wet Prep: NONE SEEN
Yeast Wet Prep HPF POC: NONE SEEN

## 2017-03-07 NOTE — ED Notes (Signed)
Pelvic exam set up at bedside.  

## 2017-03-07 NOTE — Telephone Encounter (Signed)
Pt called requesting her results from her HcG level that was drawn on 7/31. I informed pt that the number was 34. She states that she went to the ED today and they told her that her HcG level was 88. She felt better knowing that the number was increasing. Advised pt to call back with any questions.

## 2017-03-07 NOTE — ED Triage Notes (Signed)
[redacted] weeks pregnant, feel cleang the tub,c/o abdominal pain, intermittent. Denies pain at present

## 2017-03-07 NOTE — Discharge Instructions (Signed)
Your ultrasound today did not show a gestational sac today.  This may be because of an early pregnancy (too early to see by ultrasound), because of a threatened miscarriage, or because of an ectopic pregnancy.  You will need a repeat of your "quantitative HCG" (blood test) in 48 hours to help make this determination.  Avoid strenuous activity and do NOT place anything into your vagina, ie: no douching, no tampons, no sexual intercourse, no swimming or tub baths, until you are seen in follow up by your regular OB/GYN doctor.  Call your regular OB/GYN doctor today to schedule a follow up appointment in 48 hours to recheck your quantitative HCG blood test.  Return to the Emergency Department immediately if worsening.

## 2017-03-07 NOTE — ED Notes (Signed)
Patient transported to Ultrasound 

## 2017-03-07 NOTE — ED Provider Notes (Signed)
[facility]-EM[facility]Y DEPT Provider Note   CSN: [id number] Arrival date & time: 03/07/17  1058     History   Chief Complaint Chief Complaint  Patient presents with  . Pelvic Pain    x4 days  . Vaginal Discharge    x2 days    HPI Melanie Price is a 26 y.o. female.  HPI  Pt was seen at 1125. Per pt, c/o gradual onset and persistence of multiple intermittent episodes of pelvic "pain" for the past 4 days. Has been associated with vaginal discharge for the past 2 days. Pt states she was squatting and cleaning the tub 2 days ago, and "fell onto my butt."  Hx G3P1, LMP 01/30/2017, with EGA 5 1/7 weeks.  Denies vaginal bleeding, no back pain, no N/V/D, no CP/SOB, no fevers, no dysuria/hematuria.    Past Medical History:  Diagnosis Date  . Anemia   . Bleeding in early pregnancy 12/19/2015  . Contraceptive management 09/26/2015  . Encounter for Nexplanon removal 09/26/2015  . Medical history non-contributory   . Miscarriage 12/21/2015    Patient Active Problem List   Diagnosis Date Noted  . Miscarriage 12/21/2015  . Bleeding in early pregnancy 12/19/2015  . Encounter for Nexplanon removal 09/26/2015  . Contraceptive management 09/26/2015  . Breakthrough bleeding on Nexplanon 09/14/2014    Past Surgical History:  Procedure Laterality Date  . NO PAST SURGERIES      OB History    Gravida Para Term Preterm AB Living   3 1 1     1    SAB TAB Ectopic Multiple Live Births         0 1       Home Medications    Prior to Admission medications   Medication Sig Start Date End Date Taking? Authorizing Provider  Multiple Vitamins-Minerals (ADULT ONE DAILY GUMMIES) CHEW Take as directed 03/05/17   Adline PotterGriffin, Jennifer A, NP    Family History Family History  Problem Relation Age of Onset  . Cancer Maternal Grandfather        skin    Social History Social History  Substance Use Topics  . Smoking status: Never Smoker  . Smokeless tobacco: Never Used  . Alcohol use No     Allergies    Patient has no known allergies.   Review of Systems Review of Systems ROS: Statement: All systems negative except as marked or noted in the HPI; Constitutional: Negative for fever and chills. ; ; Eyes: Negative for eye pain, redness and discharge. ; ; ENMT: Negative for ear pain, hoarseness, nasal congestion, sinus pressure and sore throat. ; ; Cardiovascular: Negative for chest pain, palpitations, diaphoresis, dyspnea and peripheral edema. ; ; Respiratory: Negative for cough, wheezing and stridor. ; ; Gastrointestinal: Negative for nausea, vomiting, diarrhea, abdominal pain, blood in stool, hematemesis, jaundice and rectal bleeding. . ; ; Genitourinary: Negative for dysuria, flank pain and hematuria. ; ; GYN:  +pelvic pain, no vaginal bleeding, +vaginal discharge, no vulvar pain. ;; Musculoskeletal: Negative for back pain and neck pain. Negative for swelling and trauma.; ; Skin: Negative for pruritus, rash, abrasions, blisters, bruising and skin lesion.; ; Neuro: Negative for headache, lightheadedness and neck stiffness. Negative for weakness, altered level of consciousness, altered mental status, extremity weakness, paresthesias, involuntary movement, seizure and syncope.       Physical Exam Updated Vital Signs BP (!) 117/53   Pulse 93   Temp 97.9 F (36.6 C) (Oral)   Resp 20   Ht 5\' 7"  (1.702  m)   Wt 77.1 kg (170 lb)   LMP 01/30/2017   SpO2 97%   BMI 26.63 kg/m   Physical Exam 1130: Physical examination:  Nursing notes reviewed; Vital signs and O2 SAT reviewed;  Constitutional: Well developed, Well nourished, Well hydrated, In no acute distress; Head:  Normocephalic, atraumatic; Eyes: EOMI, PERRL, No scleral icterus; ENMT: Mouth and pharynx normal, Mucous membranes moist; Neck: Supple, Full range of motion, No lymphadenopathy; Cardiovascular: Regular rate and rhythm, No gallop; Respiratory: Breath sounds clear & equal bilaterally, No wheezes.  Speaking full sentences with ease, Normal  respiratory effort/excursion; Chest: Nontender, Movement normal; Abdomen: Soft, Nontender, Nondistended, Normal bowel sounds; Genitourinary: No CVA tenderness.  Pelvic exam performed with permission of pt and female ED tech assist during exam.  External genitalia w/o lesions. Vaginal vault with thin white/clear discharge, no bleeding.  Cervix w/o lesions, not friable, GC/chlam and wet prep obtained and sent to lab.  Bimanual exam w/o CMT, uterine or adnexal tenderness.;; Extremities: Pulses normal, No tenderness, No edema, No calf edema or asymmetry.; Neuro: AA&Ox3, Major CN grossly intact.  Speech clear. No gross focal motor or sensory deficits in extremities. Climbs on and off stretcher easily by herself. Gait steady.; Skin: Color normal, Warm, Dry.   ED Treatments / Results  Labs (all labs ordered are listed, but only abnormal results are displayed)   EKG  EKG Interpretation None       Radiology   Procedures Procedures (including critical care time)  Medications Ordered in ED Medications - No data to display   Initial Impression / Assessment and Plan / ED Course  I have reviewed the triage vital signs and the nursing notes.  Pertinent labs & imaging results that were available during my care of the patient were reviewed by me and considered in my medical decision making (see chart for details).  MDM Reviewed: previous chart, nursing note and vitals Reviewed previous: labs Interpretation: labs and ultrasound   Results for orders placed or performed during the hospital encounter of 03/07/17  Wet prep, genital  Result Value Ref Range   Yeast Wet Prep HPF POC NONE SEEN NONE SEEN   Trich, Wet Prep NONE SEEN NONE SEEN   Clue Cells Wet Prep HPF POC NONE SEEN NONE SEEN   WBC, Wet Prep HPF POC MODERATE (A) NONE SEEN   Sperm NONE SEEN   CBC with Differential  Result Value Ref Range   WBC 10.0 4.0 - 10.5 K/uL   RBC 4.67 3.87 - 5.11 MIL/uL   Hemoglobin 12.2 12.0 - 15.0 g/dL    HCT 16.1 09.6 - 04.5 %   MCV 82.0 78.0 - 100.0 fL   MCH 26.1 26.0 - 34.0 pg   MCHC 31.9 30.0 - 36.0 g/dL   RDW 40.9 81.1 - 91.4 %   Platelets 385 150 - 400 K/uL   Neutrophils Relative % 73 %   Neutro Abs 7.2 1.7 - 7.7 K/uL   Lymphocytes Relative 19 %   Lymphs Abs 1.9 0.7 - 4.0 K/uL   Monocytes Relative 7 %   Monocytes Absolute 0.7 0.1 - 1.0 K/uL   Eosinophils Relative 1 %   Eosinophils Absolute 0.1 0.0 - 0.7 K/uL   Basophils Relative 0 %   Basophils Absolute 0.0 0.0 - 0.1 K/uL  hCG, quantitative, pregnancy  Result Value Ref Range   hCG, Beta Chain, Quant, S 88 (H) <5 mIU/mL  Urinalysis, Routine w reflex microscopic  Result Value Ref Range   Color, Urine  YELLOW YELLOW   APPearance CLOUDY (A) CLEAR   Specific Gravity, Urine 1.018 1.005 - 1.030   pH 7.0 5.0 - 8.0   Glucose, UA NEGATIVE NEGATIVE mg/dL   Hgb urine dipstick NEGATIVE NEGATIVE   Bilirubin Urine NEGATIVE NEGATIVE   Ketones, ur NEGATIVE NEGATIVE mg/dL   Protein, ur NEGATIVE NEGATIVE mg/dL   Nitrite NEGATIVE NEGATIVE   Leukocytes, UA LARGE (A) NEGATIVE   RBC / HPF 0-5 0 - 5 RBC/hpf   WBC, UA 0-5 0 - 5 WBC/hpf   Bacteria, UA FEW (A) NONE SEEN   Squamous Epithelial / LPF 6-30 (A) NONE SEEN   Mucous PRESENT   ABO/Rh  Result Value Ref Range   ABO/RH(D) O POS    [place] Ob Comp Less 14 Wks Result Date: 03/07/2017 CLINICAL DATA:  Pregnancy.  Pelvic pain . EXAM: OBSTETRIC <14 WK US AND TRANSVAGINAL OB US DOPPLER ULTRASOUND OF OVARIES TECHNIQUE: Both transabdominal and transvaginal ultrasound examinations were performed for complete evaluation of the gestation as well as the maternal uterus, adnexal regions, and pelvic cul-de-sac. Transvaginal technique was performed to assess early pregnancy. Color and duplex Doppler ultrasound was utilized to evaluate blood flow to the ovaries. COMPARISON:  12/18/2015. FINDINGS: Intrauterine gestational sac: None Yolk sac:  No Embryo:  No Cardiac Activity: No Subchorionic hemorrhage:  None  visualized. Maternal uterus/adnexae: Unremarkable. Pulsed Doppler evaluation of both ovaries demonstrates normal appearing low-resistance arterial and venous waveforms. IMPRESSION: 1. No intrauterine pregnancy is noted. Follow-up beta HCG and ovary ultrasound suggested for further evaluation. 2. Exam is otherwise unremarkable. No evidence of mass lesion or torsion . Electronically Signed   By: Maisie Fushomas  Register   On: 03/07/2017 12:27    1405:  Workup reassuring. Pt will need to f/u OB/GYN MD in 48 hours for B-quant recheck. Dx and testing d/w pt and family.  Questions answered.  Verb understanding, agreeable to d/c home with outpt f/u.    Final Clinical Impressions(s) / ED Diagnoses   Final diagnoses:  Pelvic pain in pregnancy    New Prescriptions New Prescriptions   No medications on file     Samuel JesterMcManus, Adrina Armijo, DO 03/11/17 1229

## 2017-03-07 NOTE — ED Notes (Signed)
Pt fell while cleaning shower walls and slipped, pt states she landed on her bottom.  Pt had some ab pain right afterwards but none at present, pt denies any vaginal bleeding.

## 2017-03-08 ENCOUNTER — Encounter (HOSPITAL_COMMUNITY): Payer: Self-pay | Admitting: Emergency Medicine

## 2017-03-08 ENCOUNTER — Emergency Department (HOSPITAL_COMMUNITY)
Admission: EM | Admit: 2017-03-08 | Discharge: 2017-03-08 | Disposition: A | Discharge status: Home / Self Care | Payer: 59 | Attending: Emergency Medicine | Admitting: Emergency Medicine

## 2017-03-08 ENCOUNTER — Other Ambulatory Visit: Payer: Self-pay

## 2017-03-08 DIAGNOSIS — R102 Pelvic and perineal pain: Secondary | ICD-10-CM | POA: Insufficient documentation

## 2017-03-08 DIAGNOSIS — O9989 Other specified diseases and conditions complicating pregnancy, childbirth and the puerperium: Secondary | ICD-10-CM | POA: Insufficient documentation

## 2017-03-08 DIAGNOSIS — Z3A01 Less than 8 weeks gestation of pregnancy: Secondary | ICD-10-CM | POA: Insufficient documentation

## 2017-03-08 DIAGNOSIS — R509 Fever, unspecified: Secondary | ICD-10-CM | POA: Insufficient documentation

## 2017-03-08 DIAGNOSIS — R1030 Lower abdominal pain, unspecified: Secondary | ICD-10-CM | POA: Diagnosis not present

## 2017-03-08 LAB — GC/CHLAMYDIA PROBE AMP (~~LOC~~) NOT AT ARMC
Chlamydia: NEGATIVE
Neisseria Gonorrhea: NEGATIVE

## 2017-03-08 LAB — BETA HCG QUANT (REF LAB): hCG Quant: 78 m[IU]/mL

## 2017-03-08 NOTE — ED Notes (Signed)
Pt alert & oriented x4, stable gait. Patient given discharge instructions, paperwork & prescription(s). Patient  instructed to stop at the registration desk to finish any additional paperwork. Patient verbalized understanding. Pt left department w/ no further questions. 

## 2017-03-08 NOTE — ED Triage Notes (Signed)
Pt states she is pregnant and c/o lower abd pain with fever. Pt states she was seen yesterday for the same.

## 2017-03-08 NOTE — ED Provider Notes (Signed)
[facility] DEPT Provider Note   CSN: [id number] Arrival date & time: 03/08/17  2048     History   Chief Complaint Chief Complaint  Patient presents with  . Abdominal Pain    HPI Melanie Price is a 26 y.o. female.  Patient seen yesterday. Patient is gravida 3 para 1. Approximately [redacted] weeks pregnant. Seen by [facility] on Tuesday of this week. Had a quantitative hCG of 33 at that time. Yesterday seen in the emergency department had a quantitative hCG of 88. Patient also had pelvic exam urine sent for culture as well as pelvic cultures and wet prep. Patient had ultrasound of the abdomen without IUP present. But patient was told by [facility] that she is early on in this pregnancy. Quantitative hCG place that out. Patient denies any nausea or vomiting. Does have some lower abdominal discomfort. Patient felt as if she had a fever earlier today. Patient contacted OB/GYN and they recommended her to be evaluated in the emergency department.      Past Medical History:  Diagnosis Date  . Anemia   . Bleeding in early pregnancy 12/19/2015  . Contraceptive management 09/26/2015  . Encounter for Nexplanon removal 09/26/2015  . Medical history non-contributory   . Miscarriage 12/21/2015    Patient Active Problem List   Diagnosis Date Noted  . Miscarriage 12/21/2015  . Bleeding in early pregnancy 12/19/2015  . Encounter for Nexplanon removal 09/26/2015  . Contraceptive management 09/26/2015  . Breakthrough bleeding on Nexplanon 09/14/2014    Past Surgical History:  Procedure Laterality Date  . NO PAST SURGERIES      OB History    Gravida Para Term Preterm AB Living   3 1 1     1    SAB TAB Ectopic Multiple Live Births         0 1       Home Medications    Prior to Admission medications   Medication Sig Start Date End Date Taking? Authorizing Provider  Prenatal Vit-Fe Fumarate-FA (PRENATAL MULTIVITAMIN) TABS tablet Take 1 tablet by mouth daily at 12 noon.    Yes [provider]    Family History Family History  Problem Relation Age of Onset  . Cancer Maternal Grandfather        skin    Social History Social History  Substance Use Topics  . Smoking status: Never Smoker  . Smokeless tobacco: Never Used  . Alcohol use No     Allergies   Patient has no known allergies.   Review of Systems Review of Systems  Constitutional: Positive for fever.  Gastrointestinal: Positive for abdominal pain. Negative for nausea and vomiting.  Genitourinary: Positive for pelvic pain. Negative for dysuria and vaginal bleeding.  Musculoskeletal: Negative for back pain.  Skin: Negative for rash.  Neurological: Negative for headaches.  Hematological: Does not bruise/bleed easily.  Psychiatric/Behavioral: Negative for confusion.     Physical Exam Updated Vital Signs BP 119/60   Pulse 96   Temp 98.5 F (36.9 C)   Resp 18   Ht 1.702 m (5\' 7" )   Wt 77.1 kg (170 lb)   LMP 01/30/2017   SpO2 100%   BMI 26.63 kg/m   Physical Exam  Constitutional: She is oriented to person, place, and time. She appears well-developed and well-nourished. No distress.  HENT:  Head: Normocephalic and atraumatic.  Mouth/Throat: Oropharynx is clear and moist.  Eyes: Pupils are equal, round, and reactive to light. Conjunctivae and EOM are  normal.  Neck: Normal range of motion.  Cardiovascular: Normal rate, regular rhythm and normal heart sounds.   Pulmonary/Chest: Effort normal and breath sounds normal. No respiratory distress.  Abdominal: Soft. Bowel sounds are normal. There is no tenderness.  Musculoskeletal: Normal range of motion.  Neurological: She is alert and oriented to person, place, and time. No cranial nerve deficit or sensory deficit. She exhibits normal muscle tone. Coordination normal.  Skin: Skin is warm.  Nursing note and vitals reviewed.    ED Treatments / Results  Labs (all labs ordered are listed, but only abnormal results are  displayed) Labs Reviewed - No data to display  EKG  EKG Interpretation None       Radiology US Ob Comp Less 14 Wks  Result Date: 03/07/2017 CLINICAL DATA:  Pregnancy.  Pelvic pain . EXAM: OBSTETRIC <14 WK [place] AND TRANSVAGINAL OB US DOPPLER ULTRASOUND OF OVARIES TECHNIQUE: Both transabdominal and transvaginal ultrasound examinations were performed for complete evaluation of the gestation as well as the maternal uterus, adnexal regions, and pelvic cul-de-sac. Transvaginal technique was performed to assess early pregnancy. Color and duplex Doppler ultrasound was utilized to evaluate blood flow to the ovaries. COMPARISON:  12/18/2015. FINDINGS: Intrauterine gestational sac: None Yolk sac:  No Embryo:  No Cardiac Activity: No Subchorionic hemorrhage:  None visualized. Maternal uterus/adnexae: Unremarkable. Pulsed Doppler evaluation of both ovaries demonstrates normal appearing low-resistance arterial and venous waveforms. IMPRESSION: 1. No intrauterine pregnancy is noted. Follow-up beta HCG and ovary ultrasound suggested for further evaluation. 2. Exam is otherwise unremarkable. No evidence of mass lesion or torsion . Electronically Signed   By: Maisie Fus  Register   On: 03/07/2017 12:27   US Ob Transvaginal  Result Date: 03/07/2017 CLINICAL DATA:  Pregnancy.  Pelvic pain . EXAM: OBSTETRIC <14 WK [place] AND TRANSVAGINAL OB US DOPPLER ULTRASOUND OF OVARIES TECHNIQUE: Both transabdominal and transvaginal ultrasound examinations were performed for complete evaluation of the gestation as well as the maternal uterus, adnexal regions, and pelvic cul-de-sac. Transvaginal technique was performed to assess early pregnancy. Color and duplex Doppler ultrasound was utilized to evaluate blood flow to the ovaries. COMPARISON:  12/18/2015. FINDINGS: Intrauterine gestational sac: None Yolk sac:  No Embryo:  No Cardiac Activity: No Subchorionic hemorrhage:  None visualized. Maternal uterus/adnexae: Unremarkable. Pulsed Doppler  evaluation of both ovaries demonstrates normal appearing low-resistance arterial and venous waveforms. IMPRESSION: 1. No intrauterine pregnancy is noted. Follow-up beta HCG and ovary ultrasound suggested for further evaluation. 2. Exam is otherwise unremarkable. No evidence of mass lesion or torsion . Electronically Signed   By: Maisie Fus  Register   On: 03/07/2017 12:27   [place] Art/ven Flow Abd Pelv Doppler  Result Date: 03/07/2017 CLINICAL DATA:  Pregnancy.  Pelvic pain . EXAM: OBSTETRIC <14 WK [place] AND TRANSVAGINAL OB US DOPPLER ULTRASOUND OF OVARIES TECHNIQUE: Both transabdominal and transvaginal ultrasound examinations were performed for complete evaluation of the gestation as well as the maternal uterus, adnexal regions, and pelvic cul-de-sac. Transvaginal technique was performed to assess early pregnancy. Color and duplex Doppler ultrasound was utilized to evaluate blood flow to the ovaries. COMPARISON:  12/18/2015. FINDINGS: Intrauterine gestational sac: None Yolk sac:  No Embryo:  No Cardiac Activity: No Subchorionic hemorrhage:  None visualized. Maternal uterus/adnexae: Unremarkable. Pulsed Doppler evaluation of both ovaries demonstrates normal appearing low-resistance arterial and venous waveforms. IMPRESSION: 1. No intrauterine pregnancy is noted. Follow-up beta HCG and ovary ultrasound suggested for further evaluation. 2. Exam is otherwise unremarkable. No evidence of mass lesion or torsion . Electronically  Signed   By: Maisie Fushomas  Register   On: 03/07/2017 12:27    Procedures Procedures (including critical care time)  Medications Ordered in ED Medications - No data to display   Initial Impression / Assessment and Plan / ED Course  I have reviewed the triage vital signs and the nursing notes.  Pertinent labs & imaging results that were available during my care of the patient were reviewed by me and considered in my medical decision making (see chart for details).     Patient seen yesterday in  the emergency department had pelvic exam had the ultrasound and very thorough labs to include urine analysis and urine culture as well as pelvic cultures. Patient's quantitative hCG yesterday was 88 apparently earlier in the week it was 36 so it's increasing. Patient without significant abdominal pain today. Did report fever. Patient was told to be seen in the emergency department. Patient currently nontoxic no acute distress. No vaginal bleeding. No concerns for any acute abdominal process at this time. Patient's urinalysis yesterday was not totally consistent with urinary tract infection. Culture is pending this was discussed with the patient. She'll be notified if it's abnormal. Patient has follow-up with OB/GYN on Monday.   I do not see any reason for additional labs or repeat pelvic exam at this time. Patient is okay with the game plan.  Patient nontoxic no acute distress.   Final Clinical Impressions(s) / ED Diagnoses   Final diagnoses:  Less than [redacted] weeks gestation of pregnancy  Lower abdominal pain    New Prescriptions New Prescriptions   No medications on file     Vanetta MuldersZackowski, Bertrand Vowels, MD 03/08/17 2239

## 2017-03-08 NOTE — Discharge Instructions (Signed)
Return for persistent fevers. Return for vaginal bleeding. Return for worse abdominal pain. Keep your appointment with [facility] as scheduled for Monday.

## 2017-03-09 LAB — URINE CULTURE: Special Requests: NORMAL

## 2017-03-11 ENCOUNTER — Other Ambulatory Visit: Payer: 59

## 2017-03-11 ENCOUNTER — Telehealth: Payer: Self-pay | Admitting: *Deleted

## 2017-03-11 NOTE — Telephone Encounter (Signed)
LMOVM that HCG level has doubled.

## 2017-03-12 ENCOUNTER — Other Ambulatory Visit: Payer: 59

## 2017-03-12 DIAGNOSIS — O200 Threatened abortion: Secondary | ICD-10-CM

## 2017-03-13 ENCOUNTER — Telehealth: Payer: Self-pay | Admitting: *Deleted

## 2017-03-13 LAB — BETA HCG QUANT (REF LAB): HCG QUANT: 700 m[IU]/mL

## 2017-03-14 NOTE — Telephone Encounter (Signed)
Spoke with pt letting her know quant from 8/7 was 700. Needs to have repeated tomorrow am. Pt was added to lab schedule for tomorrow at 9. Pt voiced understanding. JSY

## 2017-03-15 ENCOUNTER — Other Ambulatory Visit: Payer: 59

## 2017-03-15 DIAGNOSIS — O200 Threatened abortion: Secondary | ICD-10-CM

## 2017-03-16 LAB — BETA HCG QUANT (REF LAB): hCG Quant: 2153 m[IU]/mL

## 2017-03-18 ENCOUNTER — Telehealth: Payer: Self-pay | Admitting: Adult Health

## 2017-03-18 NOTE — Telephone Encounter (Signed)
Pt aware QHCG above 2000, now will get US this week,

## 2017-03-19 ENCOUNTER — Other Ambulatory Visit: Payer: 59

## 2017-03-19 ENCOUNTER — Ambulatory Visit (INDEPENDENT_AMBULATORY_CARE_PROVIDER_SITE_OTHER): Payer: 59

## 2017-03-19 DIAGNOSIS — O3680X0 Pregnancy with inconclusive fetal viability, not applicable or unspecified: Secondary | ICD-10-CM | POA: Diagnosis not present

## 2017-03-19 NOTE — Progress Notes (Signed)
US 6 wks GS w/ys,GS is located within the left horn,no fetal pole visualized,GS 12.5 mm,normal ovaries bilat,pt will have a f/u ultrasound in 10 days per Victorino DikeJennifer

## 2017-03-21 ENCOUNTER — Other Ambulatory Visit: Payer: Self-pay | Admitting: Adult Health

## 2017-03-21 DIAGNOSIS — O3680X0 Pregnancy with inconclusive fetal viability, not applicable or unspecified: Secondary | ICD-10-CM

## 2017-03-29 ENCOUNTER — Ambulatory Visit (INDEPENDENT_AMBULATORY_CARE_PROVIDER_SITE_OTHER): Payer: 59

## 2017-03-29 DIAGNOSIS — O3680X0 Pregnancy with inconclusive fetal viability, not applicable or unspecified: Secondary | ICD-10-CM | POA: Diagnosis not present

## 2017-03-29 NOTE — Progress Notes (Signed)
[place] 6+5 wks,single IUP w/ys,positive fht 130 bpm,normal ovaries bilat,crl 8.8 mm,EDD 11/17/2017

## 2017-04-16 ENCOUNTER — Encounter: Payer: Self-pay | Admitting: Advanced Practice Midwife

## 2017-04-16 ENCOUNTER — Ambulatory Visit: Payer: 59 | Admitting: *Deleted

## 2017-04-16 ENCOUNTER — Ambulatory Visit (INDEPENDENT_AMBULATORY_CARE_PROVIDER_SITE_OTHER): Payer: 59 | Admitting: Advanced Practice Midwife

## 2017-04-16 VITALS — BP 90/68 | HR 80 | Wt 179.5 lb

## 2017-04-16 DIAGNOSIS — Z3A09 9 weeks gestation of pregnancy: Secondary | ICD-10-CM

## 2017-04-16 DIAGNOSIS — Z3481 Encounter for supervision of other normal pregnancy, first trimester: Secondary | ICD-10-CM

## 2017-04-16 DIAGNOSIS — Z3490 Encounter for supervision of normal pregnancy, unspecified, unspecified trimester: Secondary | ICD-10-CM | POA: Insufficient documentation

## 2017-04-16 DIAGNOSIS — Z1389 Encounter for screening for other disorder: Secondary | ICD-10-CM

## 2017-04-16 DIAGNOSIS — Z3682 Encounter for antenatal screening for nuchal translucency: Secondary | ICD-10-CM

## 2017-04-16 DIAGNOSIS — Z331 Pregnant state, incidental: Secondary | ICD-10-CM

## 2017-04-16 LAB — POCT URINALYSIS DIPSTICK
GLUCOSE UA: NEGATIVE
KETONES UA: NEGATIVE
Leukocytes, UA: NEGATIVE
Nitrite, UA: NEGATIVE
Protein, UA: NEGATIVE
RBC UA: NEGATIVE

## 2017-04-16 MED ORDER — DOXYLAMINE-PYRIDOXINE 10-10 MG PO TBEC: DELAYED_RELEASE_TABLET | ORAL | 3 refills | Status: DC

## 2017-04-16 NOTE — Patient Instructions (Signed)
 First Trimester of Pregnancy The first trimester of pregnancy is from week 1 until the end of week 12 (months 1 through 3). A week after a sperm fertilizes an egg, the egg will implant on the wall of the uterus. This embryo will begin to develop into a baby. Genes from you and your partner are forming the baby. The female genes determine whether the baby is a boy or a girl. At 6-8 weeks, the eyes and face are formed, and the heartbeat can be seen on ultrasound. At the end of 12 weeks, all the baby's organs are formed.  Now that you are pregnant, you will want to do everything you can to have a healthy baby. Two of the most important things are to get good prenatal care and to follow your health care provider's instructions. Prenatal care is all the medical care you receive before the baby's birth. This care will help prevent, find, and treat any problems during the pregnancy and childbirth. BODY CHANGES Your body goes through many changes during pregnancy. The changes vary from woman to woman.   You may gain or lose a couple of pounds at first.  You may feel sick to your stomach (nauseous) and throw up (vomit). If the vomiting is uncontrollable, call your health care provider.  You may tire easily.  You may develop headaches that can be relieved by medicines approved by your health care provider.  You may urinate more often. Painful urination may mean you have a bladder infection.  You may develop heartburn as a result of your pregnancy.  You may develop constipation because certain hormones are causing the muscles that push waste through your intestines to slow down.  You may develop hemorrhoids or swollen, bulging veins (varicose veins).  Your breasts may begin to grow larger and become tender. Your nipples may stick out more, and the tissue that surrounds them (areola) may become darker.  Your gums may bleed and may be sensitive to brushing and flossing.  Dark spots or blotches  (chloasma, mask of pregnancy) may develop on your face. This will likely fade after the baby is born.  Your menstrual periods will stop.  You may have a loss of appetite.  You may develop cravings for certain kinds of food.  You may have changes in your emotions from day to day, such as being excited to be pregnant or being concerned that something may go wrong with the pregnancy and baby.  You may have more vivid and strange dreams.  You may have changes in your hair. These can include thickening of your hair, rapid growth, and changes in texture. Some women also have hair loss during or after pregnancy, or hair that feels dry or thin. Your hair will most likely return to normal after your baby is born. WHAT TO EXPECT AT YOUR PRENATAL VISITS During a routine prenatal visit:  You will be weighed to make sure you and the baby are growing normally.  Your blood pressure will be taken.  Your abdomen will be measured to track your baby's growth.  The fetal heartbeat will be listened to starting around week 10 or 12 of your pregnancy.  Test results from any previous visits will be discussed. Your health care provider may ask you:  How you are feeling.  If you are feeling the baby move.  If you have had any abnormal symptoms, such as leaking fluid, bleeding, severe headaches, or abdominal cramping.  If you have any questions. Other   tests that may be performed during your first trimester include:  Blood tests to find your blood type and to check for the presence of any previous infections. They will also be used to check for low iron levels (anemia) and Rh antibodies. Later in the pregnancy, blood tests for diabetes will be done along with other tests if problems develop.  Urine tests to check for infections, diabetes, or protein in the urine.  An ultrasound to confirm the proper growth and development of the baby.  An amniocentesis to check for possible genetic problems.  Fetal  screens for spina bifida and Down syndrome.  You may need other tests to make sure you and the baby are doing well. HOME CARE INSTRUCTIONS  Medicines  Follow your health care provider's instructions regarding medicine use. Specific medicines may be either safe or unsafe to take during pregnancy.  Take your prenatal vitamins as directed.  If you develop constipation, try taking a stool softener if your health care provider approves. Diet  Eat regular, well-balanced meals. Choose a variety of foods, such as meat or vegetable-based protein, fish, milk and low-fat dairy products, vegetables, fruits, and whole grain breads and cereals. Your health care provider will help you determine the amount of weight gain that is right for you.  Avoid raw meat and uncooked cheese. These carry germs that can cause birth defects in the baby.  Eating four or five small meals rather than three large meals a day may help relieve nausea and vomiting. If you start to feel nauseous, eating a few soda crackers can be helpful. Drinking liquids between meals instead of during meals also seems to help nausea and vomiting.  If you develop constipation, eat more high-fiber foods, such as fresh vegetables or fruit and whole grains. Drink enough fluids to keep your urine clear or pale yellow. Activity and Exercise  Exercise only as directed by your health care provider. Exercising will help you:  Control your weight.  Stay in shape.  Be prepared for labor and delivery.  Experiencing pain or cramping in the lower abdomen or low back is a good sign that you should stop exercising. Check with your health care provider before continuing normal exercises.  Try to avoid standing for long periods of time. Move your legs often if you must stand in one place for a long time.  Avoid heavy lifting.  Wear low-heeled shoes, and practice good posture.  You may continue to have sex unless your health care provider directs you  otherwise. Relief of Pain or Discomfort  Wear a good support bra for breast tenderness.   Take warm sitz baths to soothe any pain or discomfort caused by hemorrhoids. Use hemorrhoid cream if your health care provider approves.   Rest with your legs elevated if you have leg cramps or low back pain.  If you develop varicose veins in your legs, wear support hose. Elevate your feet for 15 minutes, 3-4 times a day. Limit salt in your diet. Prenatal Care  Schedule your prenatal visits by the twelfth week of pregnancy. They are usually scheduled monthly at first, then more often in the last 2 months before delivery.  Write down your questions. Take them to your prenatal visits.  Keep all your prenatal visits as directed by your health care provider. Safety  Wear your seat belt at all times when driving.  Make a list of emergency phone numbers, including numbers for family, friends, the hospital, and police and fire departments. General   Tips  Ask your health care provider for a referral to a local prenatal education class. Begin classes no later than at the beginning of month 6 of your pregnancy.  Ask for help if you have counseling or nutritional needs during pregnancy. Your health care provider can offer advice or refer you to specialists for help with various needs.  Do not use hot tubs, steam rooms, or saunas.  Do not douche or use tampons or scented sanitary pads.  Do not cross your legs for long periods of time.  Avoid cat litter boxes and soil used by cats. These carry germs that can cause birth defects in the baby and possibly loss of the fetus by miscarriage or stillbirth.  Avoid all smoking, herbs, alcohol, and medicines not prescribed by your health care provider. Chemicals in these affect the formation and growth of the baby.  Schedule a dentist appointment. At home, brush your teeth with a soft toothbrush and be gentle when you floss. SEEK MEDICAL CARE IF:   You have  dizziness.  You have mild pelvic cramps, pelvic pressure, or nagging pain in the abdominal area.  You have persistent nausea, vomiting, or diarrhea.  You have a bad smelling vaginal discharge.  You have pain with urination.  You notice increased swelling in your face, hands, legs, or ankles. SEEK IMMEDIATE MEDICAL CARE IF:   You have a fever.  You are leaking fluid from your vagina.  You have spotting or bleeding from your vagina.  You have severe abdominal cramping or pain.  You have rapid weight gain or loss.  You vomit blood or material that looks like coffee grounds.  You are exposed to German measles and have never had them.  You are exposed to fifth disease or chickenpox.  You develop a severe headache.  You have shortness of breath.  You have any kind of trauma, such as from a fall or a car accident. Document Released: 07/17/2001 Document Revised: 12/07/2013 Document Reviewed: 06/02/2013 [employer] Patient Information 2015 [employer]. This information is not intended to replace advice given to you by your health care provider. Make sure you discuss any questions you have with your health care provider.   Nausea & Vomiting  Have saltine crackers or pretzels by your bed and eat a few bites before you raise your head out of bed in the morning  Eat small frequent meals throughout the day instead of large meals  Drink plenty of fluids throughout the day to stay hydrated, just don't drink a lot of fluids with your meals.  This can make your stomach fill up faster making you feel sick  Do not brush your teeth right after you eat  Products with real ginger are good for nausea, like ginger ale and ginger hard candy Make sure it says made with real ginger!  Sucking on sour candy like lemon heads is also good for nausea  If your prenatal vitamins make you nauseated, take them at night so you will sleep through the nausea  Sea Bands  If you feel like you need  medicine for the nausea & vomiting please let us know  If you are unable to keep any fluids or food down please let us know   Constipation  Drink plenty of fluid, preferably water, throughout the day  Eat foods high in fiber such as fruits, vegetables, and grains  Exercise, such as walking, is a good way to keep your bowels regular  Drink warm fluids, especially warm   prune juice, or decaf coffee  Eat a 1/2 cup of real oatmeal (not instant), 1/2 cup applesauce, and 1/2-1 cup warm prune juice every day  If needed, you may take Colace (docusate sodium) stool softener once or twice a day to help keep the stool soft. If you are pregnant, wait until you are out of your first trimester (12-14 weeks of pregnancy)  If you still are having problems with constipation, you may take Miralax once daily as needed to help keep your bowels regular.  If you are pregnant, wait until you are out of your first trimester (12-14 weeks of pregnancy)  Safe Medications in Pregnancy   Acne: Benzoyl Peroxide Salicylic Acid  Backache/Headache: Tylenol: 2 regular strength every 4 hours OR              2 Extra strength every 6 hours  Colds/Coughs/Allergies: Benadryl (alcohol free) 25 mg every 6 hours as needed Breath right strips Claritin Cepacol throat lozenges Chloraseptic throat spray Cold-Eeze- up to three times per day Cough drops, alcohol free Flonase (by prescription only) Guaifenesin Mucinex Robitussin DM (plain only, alcohol free) Saline nasal spray/drops Sudafed (pseudoephedrine) & Actifed ** use only after [redacted] weeks gestation and if you do not have high blood pressure Tylenol Vicks Vaporub Zinc lozenges Zyrtec   Constipation: Colace Ducolax suppositories Fleet enema Glycerin suppositories Metamucil Milk of magnesia Miralax Senokot Smooth move tea  Diarrhea: Kaopectate Imodium A-D  *NO pepto Bismol  Hemorrhoids: Anusol Anusol HC Preparation  H Tucks  Indigestion: Tums Maalox Mylanta Zantac  Pepcid  Insomnia: Benadryl (alcohol free) 25mg every 6 hours as needed Tylenol PM Unisom, no Gelcaps  Leg Cramps: Tums MagGel  Nausea/Vomiting:  Bonine Dramamine Emetrol Ginger extract Sea bands Meclizine  Nausea medication to take during pregnancy:  Unisom (doxylamine succinate 25 mg tablets) Take one tablet daily at bedtime. If symptoms are not adequately controlled, the dose can be increased to a maximum recommended dose of two tablets daily (1/2 tablet in the morning, 1/2 tablet mid-afternoon and one at bedtime). Vitamin B6 100mg tablets. Take one tablet twice a day (up to 200 mg per day).  Skin Rashes: Aveeno products Benadryl cream or 25mg every 6 hours as needed Calamine Lotion 1% cortisone cream  Yeast infection: Gyne-lotrimin 7 Monistat 7   **If taking multiple medications, please check labels to avoid duplicating the same active ingredients **take medication as directed on the label ** Do not exceed 4000 mg of tylenol in 24 hours **Do not take medications that contain aspirin or ibuprofen      

## 2017-04-16 NOTE — Progress Notes (Signed)
  Subjective:    Melanie SkinnerKayla C Price is a G3P1011 5525w2d being seen today for her first obstetrical visit.  Her obstetrical history is significant for term SVD w/o problems.  Pregnancy history fully reviewed.  Patient reports fatigue and nausea.  Vitals:   04/16/17 0936  BP: 90/68  Pulse: 80  Weight: 179 lb 8 oz (81.4 kg)    HISTORY: OB History  Gravida Para Term Preterm AB Living  3 1 1   1 1   SAB TAB Ectopic Multiple Live Births  1     0 1    # Outcome Date GA Lbr Len/2nd Weight Sex Delivery Anes PTL Lv  3 Current           2 SAB 11/2015 7271w0d         1 Term 06/17/14 6026w2d / 01:36 7 lb 2.6 oz (3.25 kg) M Vag-Spont EPI N LIV     Birth Comments: none     Past Medical History:  Diagnosis Date  . Anemia   . Bleeding in early pregnancy 12/19/2015  . Contraceptive management 09/26/2015  . Encounter for Nexplanon removal 09/26/2015  . Miscarriage 12/21/2015   Past Surgical History:  Procedure Laterality Date  . NO PAST SURGERIES     Family History  Problem Relation Age of Onset  . Cancer Maternal Grandfather        skin  . Pyloric stenosis Son   . Irritable bowel syndrome Paternal Aunt   . Crohn's disease Paternal Aunt   . Heart disease Maternal Aunt   . Heart disease Maternal Uncle      Exam                                      System:     Skin: normal coloration and turgor, no rashes    Neurologic: oriented, normal, normal mood   Extremities: normal strength, tone, and muscle mass   HEENT PERRLA   Mouth/Teeth mucous membranes moist, normla dentition   Neck supple and no masses   Cardiovascular: regular rate and rhythm   Respiratory:  appears well, vitals normal, no respiratory distress, acyanotic   Abdomen: soft, non-tender;  FHR: 160 US          Assessment:    Pregnancy: G3P1011 Patient Active Problem List   Diagnosis Date Noted  . Supervision of normal pregnancy 04/16/2017  . Miscarriage 12/21/2015  . Bleeding in early pregnancy 12/19/2015         Plan:     Initial labs drawn. Continue prenatal vitamins  Problem list reviewed and updated  Reviewed n/v relief measures and warning s/s to report  Rx Diclegis (I went to [facility] to preauth and it was no longer listed as a nonpreferred med) Reviewed recommended weight gain based on pre-gravid BMI  Encouraged well-balanced diet Genetic Screening discussed Integrated Screen: requested.  Ultrasound discussed; fetal survey: requested.  Return in about 3 weeks (around 05/07/2017) for LROB, US:NT+1st IT.  CRESENZO-DISHMAN,Cyd Hostler 04/16/2017

## 2017-04-17 LAB — PMP SCREEN PROFILE (10S), URINE
Amphetamine Scrn, Ur: NEGATIVE ng/mL
BARBITURATE SCREEN URINE: NEGATIVE ng/mL
BENZODIAZEPINE SCREEN, URINE: NEGATIVE ng/mL
CANNABINOIDS UR QL SCN: NEGATIVE ng/mL
CREATININE(CRT), U: 226.3 mg/dL (ref 20.0–300.0)
Cocaine (Metab) Scrn, Ur: NEGATIVE ng/mL
Methadone Screen, Urine: NEGATIVE ng/mL
OPIATE SCREEN URINE: NEGATIVE ng/mL
OXYCODONE+OXYMORPHONE UR QL SCN: NEGATIVE ng/mL
PH UR, DRUG SCRN: 5.9 (ref 4.5–8.9)
Phencyclidine Qn, Ur: NEGATIVE ng/mL
Propoxyphene Scrn, Ur: NEGATIVE ng/mL

## 2017-04-17 LAB — GC/CHLAMYDIA PROBE AMP
Chlamydia trachomatis, NAA: NEGATIVE
NEISSERIA GONORRHOEAE BY PCR: NEGATIVE

## 2017-04-18 LAB — URINE CULTURE

## 2017-04-22 LAB — CBC
HEMATOCRIT: 37.2 % (ref 34.0–46.6)
HEMOGLOBIN: 11.8 g/dL (ref 11.1–15.9)
MCH: 26.1 pg — AB (ref 26.6–33.0)
MCHC: 31.7 g/dL (ref 31.5–35.7)
MCV: 82 fL (ref 79–97)
Platelets: 405 10*3/uL — ABNORMAL HIGH (ref 150–379)
RBC: 4.52 x10E6/uL (ref 3.77–5.28)
RDW: 15 % (ref 12.3–15.4)
WBC: 11.8 10*3/uL — ABNORMAL HIGH (ref 3.4–10.8)

## 2017-04-22 LAB — CYSTIC FIBROSIS MUTATION 97: Interpretation: NOT DETECTED

## 2017-04-22 LAB — URINALYSIS, ROUTINE W REFLEX MICROSCOPIC
BILIRUBIN UA: NEGATIVE
Glucose, UA: NEGATIVE
KETONES UA: NEGATIVE
Nitrite, UA: NEGATIVE
PROTEIN UA: NEGATIVE
RBC, UA: NEGATIVE
SPEC GRAV UA: 1.021 (ref 1.005–1.030)
Urobilinogen, Ur: 0.2 mg/dL (ref 0.2–1.0)
pH, UA: 6 (ref 5.0–7.5)

## 2017-04-22 LAB — HIV ANTIBODY (ROUTINE TESTING W REFLEX): HIV SCREEN 4TH GENERATION: NONREACTIVE

## 2017-04-22 LAB — MICROSCOPIC EXAMINATION: CASTS: NONE SEEN /LPF

## 2017-04-22 LAB — ANTIBODY SCREEN: ANTIBODY SCREEN: NEGATIVE

## 2017-04-22 LAB — RPR: RPR Ser Ql: NONREACTIVE

## 2017-04-22 LAB — HEPATITIS B SURFACE ANTIGEN: HEP B S AG: NEGATIVE

## 2017-05-07 ENCOUNTER — Ambulatory Visit (INDEPENDENT_AMBULATORY_CARE_PROVIDER_SITE_OTHER): Payer: 59

## 2017-05-07 ENCOUNTER — Other Ambulatory Visit: Payer: 59

## 2017-05-07 ENCOUNTER — Encounter: Payer: 59 | Admitting: Women's Health

## 2017-05-07 DIAGNOSIS — Z3682 Encounter for antenatal screening for nuchal translucency: Secondary | ICD-10-CM

## 2017-05-07 DIAGNOSIS — Z3401 Encounter for supervision of normal first pregnancy, first trimester: Secondary | ICD-10-CM

## 2017-05-07 NOTE — Progress Notes (Signed)
[place] 12+2 wks,measurements c/w dates,NB present,NT 1.3 mm,normal ovaries bilat,crl 63.59 mm,fhr 158 bpm,anterior placenta,

## 2017-05-08 ENCOUNTER — Ambulatory Visit (INDEPENDENT_AMBULATORY_CARE_PROVIDER_SITE_OTHER): Payer: 59 | Admitting: Women's Health

## 2017-05-08 ENCOUNTER — Encounter: Payer: Self-pay | Admitting: Women's Health

## 2017-05-08 VITALS — BP 118/60 | HR 78 | Wt 177.0 lb

## 2017-05-08 DIAGNOSIS — Z331 Pregnant state, incidental: Secondary | ICD-10-CM

## 2017-05-08 DIAGNOSIS — Z3481 Encounter for supervision of other normal pregnancy, first trimester: Secondary | ICD-10-CM

## 2017-05-08 DIAGNOSIS — Z3A12 12 weeks gestation of pregnancy: Secondary | ICD-10-CM

## 2017-05-08 DIAGNOSIS — Z1389 Encounter for screening for other disorder: Secondary | ICD-10-CM

## 2017-05-08 LAB — POCT URINALYSIS DIPSTICK
Blood, UA: NEGATIVE
GLUCOSE UA: NEGATIVE
Ketones, UA: NEGATIVE
LEUKOCYTES UA: NEGATIVE
NITRITE UA: NEGATIVE
PROTEIN UA: NEGATIVE

## 2017-05-08 MED ORDER — DOXYLAMINE-PYRIDOXINE ER 20-20 MG PO TBCR: 1.0000 | EXTENDED_RELEASE_TABLET | Freq: Every day | ORAL | 8 refills | Status: DC

## 2017-05-08 NOTE — Progress Notes (Signed)
   [facility] Low-Risk Pregnancy Visit  Patient name: Melanie Price MRN [id number]  Date of birth: 1991/05/12 CC & HPI:  Melanie Price is a 26 y.o. G96P1011 female at [redacted]w[redacted]d with an Estimated Date of Delivery: 11/17/17 being seen today for ongoing management of a low-risk pregnancy.  Today she reports continued nausea, has been unable to get rx filled d/t pharmacy/insurance issues. Still wants. Had 1st IT/NT yesterday.  Review of Systems:   Reports no fm.   Denies regular contractions, leakage of fluid, vaginal bleeding, abnormal vaginal discharge w/ itching/odor/irritation, headaches, visual changes, shortness of breath, chest pain, abdominal pain, severe nausea/vomiting, or problems with urination or bowel movements. Pertinent History Reviewed:  Reviewed past medical,surgical and family history.  Reviewed problem list, medications and allergies. Objective Findings:   Vitals:   05/08/17 1000  BP: 118/60  Pulse: 78  Weight: 177 lb (80.3 kg)    Body mass index is 27.72 kg/m.  Fundal height: 12wks Fetal heart rate: 160 SVE: n/a  Results for orders placed or performed in visit on 05/08/17 (from the past 24 hour(s))  POCT Urinalysis Dipstick   Collection Time: 05/08/17 10:04 AM  Result Value Ref Range   Color, UA     Clarity, UA     Glucose, UA neg    Bilirubin, UA     Ketones, UA neg    Spec Grav, UA  1.010 - 1.025   Blood, UA neg    pH, UA  5.0 - 8.0   Protein, UA neg    Urobilinogen, UA  0.2 or 1.0 E.U./dL   Nitrite, UA neg    Leukocytes, UA Negative Negative    Assessment & Plan:  1) Low-risk pregnancy G3P1011 at [redacted]w[redacted]d with an Estimated Date of Delivery: 11/17/17  2) Nausea of pregnancy> Rx Bonjesta to [facility], pt to let us know if she has any issues getting it  Declines flu shot today Reviewed: warning s/s to report. All questions were answered Plan:  Continue routine obstetrical care   Return in about 4 weeks (around 06/05/2017) for LROB, 2nd IT.  Orders  Placed This Encounter  Procedures  . POCT Urinalysis Dipstick   Marge Duncans CNM, [facility] 05/08/2017 10:19 AM

## 2017-05-08 NOTE — Patient Instructions (Addendum)
Nausea & Vomiting  Have saltine crackers or pretzels by your bed and eat a few bites before you raise your head out of bed in the morning  Eat small frequent meals throughout the day instead of large meals  Drink plenty of fluids throughout the day to stay hydrated, just don't drink a lot of fluids with your meals.  This can make your stomach fill up faster making you feel sick  Do not brush your teeth right after you eat  Products with real ginger are good for nausea, like ginger ale and ginger hard candy Make sure it says made with real ginger!  Sucking on sour candy like lemon heads is also good for nausea  If your prenatal vitamins make you nauseated, take them at night so you will sleep through the nausea  Sea Bands  If you feel like you need medicine for the nausea & vomiting please let us know  If you are unable to keep any fluids or food down please let us know     Second Trimester of Pregnancy The second trimester is from week 14 through week 27 (months 4 through 6). The second trimester is often a time when you feel your best. Your body has adjusted to being pregnant, and you begin to feel better physically. Usually, morning sickness has lessened or quit completely, you may have more energy, and you may have an increase in appetite. The second trimester is also a time when the fetus is growing rapidly. At the end of the sixth month, the fetus is about 9 inches long and weighs about 1 pounds. You will likely begin to feel the baby move (quickening) between 16 and 20 weeks of pregnancy. Body changes during your second trimester Your body continues to go through many changes during your second trimester. The changes vary from woman to woman.  Your weight will continue to increase. You will notice your lower abdomen bulging out.  You may begin to get stretch marks on your hips, abdomen, and breasts.  You may develop headaches that can be relieved by medicines. The medicines  should be approved by your health care provider.  You may urinate more often because the fetus is pressing on your bladder.  You may develop or continue to have heartburn as a result of your pregnancy.  You may develop constipation because certain hormones are causing the muscles that push waste through your intestines to slow down.  You may develop hemorrhoids or swollen, bulging veins (varicose veins).  You may have back pain. This is caused by: ? Weight gain. ? Pregnancy hormones that are relaxing the joints in your pelvis. ? A shift in weight and the muscles that support your balance.  Your breasts will continue to grow and they will continue to become tender.  Your gums may bleed and may be sensitive to brushing and flossing.  Dark spots or blotches (chloasma, mask of pregnancy) may develop on your face. This will likely fade after the baby is born.  A dark line from your belly button to the pubic area (linea nigra) may appear. This will likely fade after the baby is born.  You may have changes in your hair. These can include thickening of your hair, rapid growth, and changes in texture. Some women also have hair loss during or after pregnancy, or hair that feels dry or thin. Your hair will most likely return to normal after your baby is born.  What to expect at prenatal visits   During a routine prenatal visit:  You will be weighed to make sure you and the fetus are growing normally.  Your blood pressure will be taken.  Your abdomen will be measured to track your baby's growth.  The fetal heartbeat will be listened to.  Any test results from the previous visit will be discussed.  Your health care provider may ask you:  How you are feeling.  If you are feeling the baby move.  If you have had any abnormal symptoms, such as leaking fluid, bleeding, severe headaches, or abdominal cramping.  If you are using any tobacco products, including cigarettes, chewing tobacco, and  electronic cigarettes.  If you have any questions.  Other tests that may be performed during your second trimester include:  Blood tests that check for: ? Low iron levels (anemia). ? High blood sugar that affects pregnant women (gestational diabetes) between 24 and 28 weeks. ? Rh antibodies. This is to check for a protein on red blood cells (Rh factor).  Urine tests to check for infections, diabetes, or protein in the urine.  An ultrasound to confirm the proper growth and development of the baby.  An amniocentesis to check for possible genetic problems.  Fetal screens for spina bifida and Down syndrome.  HIV (human immunodeficiency virus) testing. Routine prenatal testing includes screening for HIV, unless you choose not to have this test.  Follow these instructions at home: Medicines  Follow your health care provider's instructions regarding medicine use. Specific medicines may be either safe or unsafe to take during pregnancy.  Take a prenatal vitamin that contains at least 600 micrograms (mcg) of folic acid.  If you develop constipation, try taking a stool softener if your health care provider approves. Eating and drinking  Eat a balanced diet that includes fresh fruits and vegetables, whole grains, good sources of protein such as meat, eggs, or tofu, and low-fat dairy. Your health care provider will help you determine the amount of weight gain that is right for you.  Avoid raw meat and uncooked cheese. These carry germs that can cause birth defects in the baby.  If you have low calcium intake from food, talk to your health care provider about whether you should take a daily calcium supplement.  Limit foods that are high in fat and processed sugars, such as fried and sweet foods.  To prevent constipation: ? Drink enough fluid to keep your urine clear or pale yellow. ? Eat foods that are high in fiber, such as fresh fruits and vegetables, whole grains, and  beans. Activity  Exercise only as directed by your health care provider. Most women can continue their usual exercise routine during pregnancy. Try to exercise for 30 minutes at least 5 days a week. Stop exercising if you experience uterine contractions.  Avoid heavy lifting, wear low heel shoes, and practice good posture.  A sexual relationship may be continued unless your health care provider directs you otherwise. Relieving pain and discomfort  Wear a good support bra to prevent discomfort from breast tenderness.  Take warm sitz baths to soothe any pain or discomfort caused by hemorrhoids. Use hemorrhoid cream if your health care provider approves.  Rest with your legs elevated if you have leg cramps or low back pain.  If you develop varicose veins, wear support hose. Elevate your feet for 15 minutes, 3-4 times a day. Limit salt in your diet. Prenatal Care  Write down your questions. Take them to your prenatal visits.  Keep all your   prenatal visits as told by your health care provider. This is important. Safety  Wear your seat belt at all times when driving.  Make a list of emergency phone numbers, including numbers for family, friends, the hospital, and police and fire departments. General instructions  Ask your health care provider for a referral to a local prenatal education class. Begin classes no later than the beginning of month 6 of your pregnancy.  Ask for help if you have counseling or nutritional needs during pregnancy. Your health care provider can offer advice or refer you to specialists for help with various needs.  Do not use hot tubs, steam rooms, or saunas.  Do not douche or use tampons or scented sanitary pads.  Do not cross your legs for long periods of time.  Avoid cat litter boxes and soil used by cats. These carry germs that can cause birth defects in the baby and possibly loss of the fetus by miscarriage or stillbirth.  Avoid all smoking, herbs,  alcohol, and unprescribed drugs. Chemicals in these products can affect the formation and growth of the baby.  Do not use any products that contain nicotine or tobacco, such as cigarettes and e-cigarettes. If you need help quitting, ask your health care provider.  Visit your dentist if you have not gone yet during your pregnancy. Use a soft toothbrush to brush your teeth and be gentle when you floss. Contact a health care provider if:  You have dizziness.  You have mild pelvic cramps, pelvic pressure, or nagging pain in the abdominal area.  You have persistent nausea, vomiting, or diarrhea.  You have a bad smelling vaginal discharge.  You have pain when you urinate. Get help right away if:  You have a fever.  You are leaking fluid from your vagina.  You have spotting or bleeding from your vagina.  You have severe abdominal cramping or pain.  You have rapid weight gain or weight loss.  You have shortness of breath with chest pain.  You notice sudden or extreme swelling of your face, hands, ankles, feet, or legs.  You have not felt your baby move in over an hour.  You have severe headaches that do not go away when you take medicine.  You have vision changes. Summary  The second trimester is from week 14 through week 27 (months 4 through 6). It is also a time when the fetus is growing rapidly.  Your body goes through many changes during pregnancy. The changes vary from woman to woman.  Avoid all smoking, herbs, alcohol, and unprescribed drugs. These chemicals affect the formation and growth your baby.  Do not use any tobacco products, such as cigarettes, chewing tobacco, and e-cigarettes. If you need help quitting, ask your health care provider.  Contact your health care provider if you have any questions. Keep all prenatal visits as told by your health care provider. This is important. This information is not intended to replace advice given to you by your health care  provider. Make sure you discuss any questions you have with your health care provider. Document Released: 07/17/2001 Document Revised: 12/29/2015 Document Reviewed: 09/23/2012 Elsevier Interactive Patient Education  2017 [employer] Inc.  

## 2017-05-09 LAB — INTEGRATED 1
CROWN RUMP LENGTH MAT SCREEN: 63.6 mm
GEST. AGE ON COLLECTION DATE: 12.7 wk
MATERNAL AGE AT EDD: 25.8 a
NUMBER OF FETUSES: 1
Nuchal Translucency (NT): 1.3 mm
PAPP-A VALUE: 663.1 ng/mL
WEIGHT: 177 [lb_av]

## 2017-05-10 ENCOUNTER — Telehealth: Payer: Self-pay | Admitting: Women's Health

## 2017-05-10 ENCOUNTER — Encounter (HOSPITAL_COMMUNITY): Payer: Self-pay

## 2017-05-10 ENCOUNTER — Inpatient Hospital Stay (HOSPITAL_COMMUNITY)
Admission: AD | Admit: 2017-05-10 | Discharge: 2017-05-10 | Disposition: A | Discharge status: Home / Self Care | Payer: 59 | Source: Ambulatory Visit | Attending: Obstetrics & Gynecology | Admitting: Obstetrics & Gynecology

## 2017-05-10 DIAGNOSIS — Z3A12 12 weeks gestation of pregnancy: Secondary | ICD-10-CM | POA: Insufficient documentation

## 2017-05-10 DIAGNOSIS — O9989 Other specified diseases and conditions complicating pregnancy, childbirth and the puerperium: Secondary | ICD-10-CM

## 2017-05-10 DIAGNOSIS — R1010 Upper abdominal pain, unspecified: Secondary | ICD-10-CM | POA: Diagnosis present

## 2017-05-10 DIAGNOSIS — O99611 Diseases of the digestive system complicating pregnancy, first trimester: Secondary | ICD-10-CM | POA: Diagnosis not present

## 2017-05-10 DIAGNOSIS — K219 Gastro-esophageal reflux disease without esophagitis: Secondary | ICD-10-CM | POA: Diagnosis not present

## 2017-05-10 LAB — CBC
HCT: 34.9 % — ABNORMAL LOW (ref 36.0–46.0)
Hemoglobin: 11.4 g/dL — ABNORMAL LOW (ref 12.0–15.0)
MCH: 26.3 pg (ref 26.0–34.0)
MCHC: 32.7 g/dL (ref 30.0–36.0)
MCV: 80.4 fL (ref 78.0–100.0)
Platelets: 337 10*3/uL (ref 150–400)
RBC: 4.34 MIL/uL (ref 3.87–5.11)
RDW: 15.6 % — AB (ref 11.5–15.5)
WBC: 12.8 10*3/uL — ABNORMAL HIGH (ref 4.0–10.5)

## 2017-05-10 LAB — COMPREHENSIVE METABOLIC PANEL
ALBUMIN: 3.3 g/dL — AB (ref 3.5–5.0)
ALK PHOS: 154 U/L — AB (ref 38–126)
ALT: 33 U/L (ref 14–54)
ANION GAP: 6 (ref 5–15)
AST: 46 U/L — ABNORMAL HIGH (ref 15–41)
BILIRUBIN TOTAL: 0.5 mg/dL (ref 0.3–1.2)
BUN: 7 mg/dL (ref 6–20)
CALCIUM: 8.6 mg/dL — AB (ref 8.9–10.3)
CO2: 24 mmol/L (ref 22–32)
Chloride: 103 mmol/L (ref 101–111)
Creatinine, Ser: 0.54 mg/dL (ref 0.44–1.00)
GFR calc non Af Amer: >60 mL/min (ref >60)
GLUCOSE: 113 mg/dL — AB (ref 65–99)
POTASSIUM: 3.5 mmol/L (ref 3.5–5.1)
SODIUM: 133 mmol/L — AB (ref 135–145)
TOTAL PROTEIN: 7.2 g/dL (ref 6.5–8.1)

## 2017-05-10 LAB — URINALYSIS, ROUTINE W REFLEX MICROSCOPIC
Bilirubin Urine: NEGATIVE
GLUCOSE, UA: 150 mg/dL — AB
HGB URINE DIPSTICK: NEGATIVE
Ketones, ur: 5 mg/dL — AB
NITRITE: NEGATIVE
Protein, ur: NEGATIVE mg/dL
SPECIFIC GRAVITY, URINE: 1.025 (ref 1.005–1.030)
pH: 5 (ref 5.0–8.0)

## 2017-05-10 LAB — LIPASE, BLOOD: Lipase: 27 U/L (ref 11–51)

## 2017-05-10 MED ORDER — GI COCKTAIL ~~LOC~~
30.0000 mL | Freq: Once | ORAL | Status: AC
  Administered 2017-05-10: 30 mL via ORAL
  Filled 2017-05-10: qty 30

## 2017-05-10 MED ORDER — RANITIDINE HCL 150 MG PO TABS: 150.0000 mg | ORAL_TABLET | Freq: Every day | ORAL | 1 refills | Status: DC

## 2017-05-10 NOTE — Telephone Encounter (Signed)
Pt called stating that she went to [facility] to pick up her Rx for [facility] and they didn't have it. Advised pt that the prescription was sent to [facility] and provided her with the number to check on the status of it.

## 2017-05-10 NOTE — Discharge Instructions (Signed)
Heartburn During Pregnancy Heartburn is pain or discomfort in the throat or chest. It may cause a burning feeling. It happens when stomach acid moves up into the tube that carries food from your mouth to your stomach (esophagus). Heartburn is common during pregnancy. It usually goes away or gets better after giving birth. Follow these instructions at home: Eating and drinking  Do not drink alcohol while you are pregnant.  Figure out which foods and beverages make you feel worse, and avoid them.  Beverages that you may want to avoid include: ? Coffee and tea (with or without caffeine). ? Energy drinks and sports drinks. ? Bubbly (carbonated) drinks or sodas. ? Citrus fruit juices.  Foods that you may want to avoid include: ? Chocolate and cocoa. ? Peppermint and mint flavorings. ? Garlic, onions, and horseradish. ? Spicy and acidic foods. These include peppers, chili powder, curry powder, vinegar, hot sauces, and barbecue sauce. ? Citrus fruits, such as oranges, lemons, and limes. ? Tomato-based foods, such as red sauce, chili, and salsa. ? Fried and fatty foods, such as donuts, french fries, potato chips, and high-fat dressings. ? High-fat meats, such as hot dogs, cold cuts, sausage, ham, and bacon. ? High-fat dairy items, such as whole milk, butter, and cheese.  Eat small meals often, instead of large meals.  Avoid drinking a lot of liquid with your meals.  Avoid eating meals during the 2-3 hours before you go to bed.  Avoid lying down right after you eat.  Do not exercise right after you eat. Medicines  Take over-the-counter and prescription medicines only as told by your doctor.  Do not take aspirin, ibuprofen, or other NSAIDs unless your doctor tells you to do that.  Your doctor may tell you to avoid medicines that have sodium bicarbonate in them. General instructions  If told, raise the head of your bed about 6 inches (15 cm). You can do this by putting blocks under  the legs. Sleeping with more pillows does not help with heartburn.  Do not use any products that contain nicotine or tobacco, such as cigarettes and e-cigarettes. If you need help quitting, ask your doctor.  Wear loose-fitting clothing.  Try to lower your stress, such as with yoga or meditation. If you need help, ask your doctor.  Stay at a healthy weight. If you are overweight, work with your doctor to safely lose weight.  Keep all follow-up visits as told by your doctor. This is important. Contact a doctor if:  You get new symptoms.  Your symptoms do not get better with treatment.  You have weight loss and you do not know why.  You have trouble swallowing.  You make loud sounds when you breathe (wheeze).  You have a cough that does not go away.  You have heartburn often for more than 2 weeks.  You feel sick to your stomach (nauseous), and this does not get better with treatment.  You are throwing up (vomiting), and this does not get better with treatment.  You have pain in your belly (abdomen). Get help right away if:  You have very bad chest pain that spreads to your arm, neck, or jaw.  You feel sweaty, dizzy, or light-headed.  You have trouble breathing.  You have pain when swallowing.  You throw up and your throw-up looks like blood or coffee grounds.  Your poop (stool) is bloody or black. This information is not intended to replace advice given to you by your health care provider.  Make sure you discuss any questions you have with your health care provider. °Document Released: 08/25/2010 Document Revised: 04/09/2016 Document Reviewed: 04/09/2016 °Elsevier Interactive Patient Education © 2017 [employer] Inc[employer] °Food Choices for Gastroesophageal Reflux Disease, Adult °When you have gastroesophageal reflux disease (GERD), the foods you eat and your eating habits are very important. Choosing the right foods can help ease your discomfort. °What guidelines do I need to  follow? °· Choose fruits, vegetables, whole grains, and low-fat dairy products. °· Choose low-fat meat, fish, and poultry. °· Limit fats such as oils, salad dressings, butter, nuts, and avocado. °· Keep a food diary. This helps you identify foods that cause symptoms. °· Avoid foods that cause symptoms. These may be different for everyone. °· Eat small meals often instead of 3 large meals a day. °· Eat your meals slowly, in a place where you are relaxed. °· Limit fried foods. °· Cook foods using methods other than frying. °· Avoid drinking alcohol. °· Avoid drinking large amounts of liquids with your meals. °· Avoid bending over or lying down until 2-3 hours after eating. °What foods are not recommended? °These are some foods and drinks that may make your symptoms worse: °Vegetables °Tomatoes. Tomato juice. Tomato and spaghetti sauce. Chili peppers. Onion and garlic. Horseradish. °Fruits °Oranges, grapefruit, and lemon (fruit and juice). °Meats °High-fat meats, fish, and poultry. This includes hot dogs, ribs, ham, sausage, salami, and bacon. °Dairy °Whole milk and chocolate milk. Sour cream. Cream. Butter. Ice cream. Cream cheese. °Drinks °Coffee and tea. Bubbly (carbonated) drinks or energy drinks. °Condiments °Hot sauce. Barbecue sauce. °Sweets/Desserts °Chocolate and cocoa. Donuts. Peppermint and spearmint. °Fats and Oils °High-fat foods. This includes French fries and potato chips. °Other °Vinegar. Strong spices. This includes black pepper, white pepper, red pepper, cayenne, curry powder, cloves, ginger, and chili powder. °The items listed above may not be a complete list of foods and drinks to avoid. Contact your dietitian for more information. °This information is not intended to replace advice given to you by your health care provider. Make sure you discuss any questions you have with your health care provider. °Document Released: 01/22/2012 Document Revised: 12/29/2015 Document Reviewed: 05/27/2013 °Elsevier  Interactive Patient Education © 2017 [employer]vier Inc. ° °

## 2017-05-10 NOTE — MAU Note (Addendum)
Patient presents with onset of upper abdominal pain around 2:00 am which kept her awake, patient states that has eased a bit now. Vomited times 1, denies diarrhea.

## 2017-05-10 NOTE — MAU Provider Note (Signed)
History     CSN: [id number]  Arrival date and time: 05/10/17 4098  Chief Complaint  Patient presents with  . Abdominal Pain   G3P1011 .5 wks here with upper abdominal pain. Pain occurred from 2am-8am the last 2 night then resolved spontaneously. Describes as achey and constant. Took Tylenol and TUMS and had no relief. Associated sx are N/V- only occurs with the pain. No diarrhea. No fevers. No VB or cramping.     OB History    Gravida Para Term Preterm AB Living   SAB TAB Ectopic Multiple Live Births   1     0 1      Past Medical History:  Diagnosis Date  . Anemia   . Bleeding in early pregnancy 12/19/2015  . Contraceptive management 09/26/2015  . Encounter for Nexplanon removal 09/26/2015  . Miscarriage 12/21/2015    Past Surgical History:  Procedure Laterality Date  . NO PAST SURGERIES      Family History  Problem Relation Age of Onset  . Cancer Maternal Grandfather        skin  . Pyloric stenosis Son   . Irritable bowel syndrome Paternal Aunt   . Crohn's disease Paternal Aunt   . Heart disease Maternal Aunt   . Heart disease Maternal Uncle     Social History  Substance Use Topics  . Smoking status: Never Smoker  . Smokeless tobacco: Never Used  . Alcohol use No    Allergies: No Known Allergies  Prescriptions Prior to Admission  Medication Sig Dispense Refill Last Dose  . acetaminophen (TYLENOL) 500 MG tablet Take 1,000 mg by mouth every 6 (six) hours as needed for mild pain.   05/09/2017 at Unknown time  . calcium carbonate (TUMS - DOSED IN MG ELEMENTAL CALCIUM) 500 MG chewable tablet Chew 1-2 tablets by mouth daily.   05/09/2017 at Unknown time  . Prenatal Vit-Fe Fumarate-FA (PRENATAL MULTIVITAMIN) TABS tablet Take 1 tablet by mouth daily at 12 noon.   05/09/2017 at Unknown time  . Doxylamine-Pyridoxine ER (BONJESTA) 20-20 MG TBCR Take 1 tablet by mouth at bedtime. Can add 1 tablet in the morning if needed for nausea and vomiting 60 tablet 8  not stared    Review of Systems  Constitutional: Negative for fever.  Gastrointestinal: Positive for abdominal pain, nausea and vomiting. Negative for diarrhea.  Genitourinary: Negative for dysuria and vaginal bleeding.   Physical Exam   Blood pressure (!) 108/57, pulse 80, temperature 97.7 F (36.5 C), temperature source Oral, resp. rate 16, weight 177 lb (80.3 kg), last menstrual period 01/30/2017.  Physical Exam  Constitutional: She is oriented to person, place, and time. She appears well-developed and well-nourished. No distress.  HENT:  Head: Normocephalic and atraumatic.  Neck: Normal range of motion.  Cardiovascular: Normal rate.   Respiratory: Effort normal.  GI: Soft. She exhibits no distension and no mass. There is no tenderness. There is no rebound and no guarding.  Musculoskeletal: Normal range of motion.  Neurological: She is alert and oriented to person, place, and time.  Skin: Skin is warm and dry.  Psychiatric: She has a normal mood and affect.  FHT: 154  Results for orders placed or performed during the hospital encounter of 05/10/17 (from the past 24 hour(s))  Urinalysis, Routine w reflex microscopic     Status: Abnormal   Collection Time: 05/10/17  9:15 AM  Result Value Ref Range   Color, Urine YELLOW YELLOW  APPearance HAZY (A) CLEAR   Specific Gravity, Urine 1.025 1.005 - 1.030   pH 5.0 5.0 - 8.0   Glucose, UA 150 (A) NEGATIVE mg/dL   Hgb urine dipstick NEGATIVE NEGATIVE   Bilirubin Urine NEGATIVE NEGATIVE   Ketones, ur 5 (A) NEGATIVE mg/dL   Protein, ur NEGATIVE NEGATIVE mg/dL   Nitrite NEGATIVE NEGATIVE   Leukocytes, UA TRACE (A) NEGATIVE   RBC / HPF 0-5 0 - 5 RBC/hpf   WBC, UA 0-5 0 - 5 WBC/hpf   Bacteria, UA RARE (A) NONE SEEN   Squamous Epithelial / LPF 6-30 (A) NONE SEEN   Mucus PRESENT   CBC     Status: Abnormal   Collection Time: 05/10/17 10:06 AM  Result Value Ref Range   WBC 12.8 (H) 4.0 - 10.5 K/uL   RBC 4.34 3.87 - 5.11 MIL/uL    Hemoglobin 11.4 (L) 12.0 - 15.0 g/dL   HCT 96.0 (L) 45.4 - 09.8 %   MCV 80.4 78.0 - 100.0 fL   MCH 26.3 26.0 - 34.0 pg   MCHC 32.7 30.0 - 36.0 g/dL   RDW 11.9 (H) 14.7 - 82.9 %   Platelets 337 150 - 400 K/uL  Comprehensive metabolic panel     Status: Abnormal   Collection Time: 05/10/17 10:06 AM  Result Value Ref Range   Sodium 133 (L) 135 - 145 mmol/L   Potassium 3.5 3.5 - 5.1 mmol/L   Chloride 103 101 - 111 mmol/L   CO2 24 22 - 32 mmol/L   Glucose, Bld 113 (H) 65 - 99 mg/dL   BUN 7 6 - 20 mg/dL   Creatinine, Ser 5.62 0.44 - 1.00 mg/dL   Calcium 8.6 (L) 8.9 - 10.3 mg/dL   Total Protein 7.2 6.5 - 8.1 g/dL   Albumin 3.3 (L) 3.5 - 5.0 g/dL   AST 46 (H) 15 - 41 U/L   ALT 33 14 - 54 U/L   Alkaline Phosphatase 154 (H) 38 - 126 U/L   Total Bilirubin 0.5 0.3 - 1.2 mg/dL   GFR calc non Af Amer >60 >60 mL/min   GFR calc Af Amer >60 >60 mL/min   Anion gap 6 5 - 15  Lipase, blood     Status: None   Collection Time: 05/10/17 10:06 AM  Result Value Ref Range   Lipase 27 11 - 51 U/L   MAU Course  Procedures GI cocktail  MDM Labs ordered and reviewed. Pain was essentially resolved at the time of initial exam then she developed another episode of similar pain. GI cocktail given and pain improved. No evidence of acute abdominal process. Will treat GERD. Stable for discharge home.   Assessment and Plan   1. [redacted] weeks gestation of pregnancy   2. Gastroesophageal reflux disease without esophagitis    Discharge home GERD diet Follow up in OB office as scheduled Rx Zanatc Return precautions  Allergies as of 05/10/2017   No Known Allergies     Medication List    TAKE these medications   acetaminophen 500 MG tablet Commonly known as:  TYLENOL Take 1,000 mg by mouth every 6 (six) hours as needed for mild pain.   calcium carbonate 500 MG chewable tablet Commonly known as:  TUMS - dosed in mg elemental calcium Chew 1-2 tablets by mouth daily.   Doxylamine-Pyridoxine ER 20-20 MG  Tbcr Commonly known as:  BONJESTA Take 1 tablet by mouth at bedtime. Can add 1 tablet in the morning if needed for  nausea and vomiting   prenatal multivitamin Tabs tablet Take 1 tablet by mouth daily at 12 noon.   ranitidine 150 MG tablet Commonly known as:  ZANTAC Take 1 tablet (150 mg total) by mouth at bedtime. May increase to 2 times daily      Donette Larry, [place] 05/10/2017, 11:59 AM

## 2017-05-10 NOTE — Telephone Encounter (Signed)
Patient called stating that she was here on 10/3/208 to see Melanie Price and she was suppose to call in a medication for her, pt states that she called her pharmacy and they do not have it. Pt states she uses [facility] in [place]. Please contact pt

## 2017-05-11 ENCOUNTER — Emergency Department (HOSPITAL_COMMUNITY)
Admission: EM | Admit: 2017-05-11 | Discharge: 2017-05-11 | Disposition: A | Discharge status: Home / Self Care | Payer: 59 | Attending: Emergency Medicine | Admitting: Emergency Medicine

## 2017-05-11 ENCOUNTER — Encounter (HOSPITAL_COMMUNITY): Payer: Self-pay | Admitting: Emergency Medicine

## 2017-05-11 DIAGNOSIS — K219 Gastro-esophageal reflux disease without esophagitis: Secondary | ICD-10-CM | POA: Diagnosis not present

## 2017-05-11 DIAGNOSIS — Z8759 Personal history of other complications of pregnancy, childbirth and the puerperium: Secondary | ICD-10-CM | POA: Diagnosis not present

## 2017-05-11 DIAGNOSIS — Z79899 Other long term (current) drug therapy: Secondary | ICD-10-CM | POA: Insufficient documentation

## 2017-05-11 DIAGNOSIS — O99611 Diseases of the digestive system complicating pregnancy, first trimester: Secondary | ICD-10-CM | POA: Insufficient documentation

## 2017-05-11 DIAGNOSIS — R109 Unspecified abdominal pain: Secondary | ICD-10-CM

## 2017-05-11 DIAGNOSIS — Z3A12 12 weeks gestation of pregnancy: Secondary | ICD-10-CM | POA: Diagnosis not present

## 2017-05-11 DIAGNOSIS — R1013 Epigastric pain: Secondary | ICD-10-CM

## 2017-05-11 LAB — CBC
HEMATOCRIT: 39 % (ref 36.0–46.0)
HEMOGLOBIN: 12.5 g/dL (ref 12.0–15.0)
MCH: 26.1 pg (ref 26.0–34.0)
MCHC: 32.1 g/dL (ref 30.0–36.0)
MCV: 81.4 fL (ref 78.0–100.0)
PLATELETS: 361 10*3/uL (ref 150–400)
RBC: 4.79 MIL/uL (ref 3.87–5.11)
RDW: 15.3 % (ref 11.5–15.5)
WBC: 14.6 10*3/uL — AB (ref 4.0–10.5)

## 2017-05-11 LAB — URINALYSIS, ROUTINE W REFLEX MICROSCOPIC
Bilirubin Urine: NEGATIVE
GLUCOSE, UA: NEGATIVE mg/dL
Hgb urine dipstick: NEGATIVE
Ketones, ur: NEGATIVE mg/dL
Leukocytes, UA: NEGATIVE
Nitrite: NEGATIVE
PH: 5 (ref 5.0–8.0)
PROTEIN: NEGATIVE mg/dL
Specific Gravity, Urine: 1.027 (ref 1.005–1.030)

## 2017-05-11 LAB — COMPREHENSIVE METABOLIC PANEL
ALT: 34 U/L (ref 14–54)
ANION GAP: 7 (ref 5–15)
AST: 49 U/L — ABNORMAL HIGH (ref 15–41)
Albumin: 3.6 g/dL (ref 3.5–5.0)
Alkaline Phosphatase: 164 U/L — ABNORMAL HIGH (ref 38–126)
BUN: 7 mg/dL (ref 6–20)
CHLORIDE: 102 mmol/L (ref 101–111)
CO2: 25 mmol/L (ref 22–32)
Calcium: 9.3 mg/dL (ref 8.9–10.3)
Creatinine, Ser: 0.58 mg/dL (ref 0.44–1.00)
Glucose, Bld: 120 mg/dL — ABNORMAL HIGH (ref 65–99)
POTASSIUM: 3.9 mmol/L (ref 3.5–5.1)
Sodium: 134 mmol/L — ABNORMAL LOW (ref 135–145)
Total Bilirubin: 0.4 mg/dL (ref 0.3–1.2)
Total Protein: 8 g/dL (ref 6.5–8.1)

## 2017-05-11 LAB — LIPASE, BLOOD: LIPASE: 35 U/L (ref 11–51)

## 2017-05-11 MED ORDER — GI COCKTAIL ~~LOC~~
30.0000 mL | Freq: Once | ORAL | Status: AC
  Administered 2017-05-11: 30 mL via ORAL
  Filled 2017-05-11: qty 30

## 2017-05-11 MED ORDER — SUCRALFATE 1 G PO TABS: 1.0000 g | ORAL_TABLET | Freq: Three times a day (TID) | ORAL | 0 refills | Status: DC

## 2017-05-11 MED ORDER — OMEPRAZOLE 20 MG PO CPDR: 20.0000 mg | DELAYED_RELEASE_CAPSULE | Freq: Two times a day (BID) | ORAL | 0 refills | Status: DC

## 2017-05-11 NOTE — Discharge Instructions (Signed)
IMPORTANT PATIENT INSTRUCTIONS:  Your ED provider has recommended an Outpatient Ultrasound.  Please call [phone] to schedule an appointment.  If your appointment is scheduled for a Saturday, Sunday or holiday, please go to the [facility] Emergency Department Registration Desk at least 15 minutes prior to your appointment time and tell them you are there for an ultrasound.    If your appointment is scheduled for a weekday (Monday-Friday), please go directly to the [facility] Radiology Department at least 15 minutes prior to your appointment time and tell them you are there for an ultrasound.  Please call [phone] with questions.    Please only take tylenol for pain. Stop taking zantac and take omeprazole instead. You can also take antacids and prescribed sucralfate. Return without fail for worsening symptoms, including fever, escalating pain, intractable vomiting or any other symptoms concerning to you.

## 2017-05-11 NOTE — ED Notes (Signed)
Pt eating [employer] when called for room

## 2017-05-11 NOTE — ED Provider Notes (Signed)
[facility]-EMERGENCY DEPT Provider Note   CSN: [id number] Arrival date & time: 05/11/17  1049     History   Chief Complaint Chief Complaint  Patient presents with  . Abdominal Pain    HPI Melanie Price is a 25 y.o. female.  The history is provided by the patient.  Abdominal Pain   This is a new problem. The current episode started yesterday. The problem occurs constantly. The problem has not changed since onset.The pain is associated with an unknown factor. The pain is located in the epigastric region. The pain is moderate. Pertinent negatives include fever, diarrhea, melena, nausea and vomiting. Nothing aggravates the symptoms. Nothing relieves the symptoms.     26 year old female who presents with abdominal pain. She is [redacted] weeks gestational age, followed by [facility]. Seen for intermittent epigastric discomfort by [facility]. Started on zantac for GERD. Pain constant for the past day. No recent nausea, vomiting. Has had loose stools. No dysuria, urinary frequency, vaginal bleeding or discharge. No fever or chills. Sometimes worse with eating, others not associated with food.   Past Medical History:  Diagnosis Date  . Anemia   . Bleeding in early pregnancy 12/19/2015  . Contraceptive management 09/26/2015  . Encounter for Nexplanon removal 09/26/2015  . Miscarriage 12/21/2015    Patient Active Problem List   Diagnosis Date Noted  . Supervision of normal pregnancy 04/16/2017  . Miscarriage 12/21/2015  . Bleeding in early pregnancy 12/19/2015    Past Surgical History:  Procedure Laterality Date  . NO PAST SURGERIES      OB History    Gravida Para Term Preterm AB Living   SAB TAB Ectopic Multiple Live Births   1     0 1       Home Medications    Prior to Admission medications   Medication Sig Start Date End Date Taking? Authorizing Provider  acetaminophen (TYLENOL) 500 MG tablet Take 1,000 mg by mouth every 6 (six) hours as needed for mild  pain.   Yes [provider]  calcium carbonate (TUMS - DOSED IN MG ELEMENTAL CALCIUM) 500 MG chewable tablet Chew 1-2 tablets by mouth daily.   Yes [provider]  Prenatal Vit-Fe Fumarate-FA (PRENATAL MULTIVITAMIN) TABS tablet Take 1 tablet by mouth daily at 12 noon.   Yes [provider]  Doxylamine-Pyridoxine ER (BONJESTA) 20-20 MG TBCR Take 1 tablet by mouth at bedtime. Can add 1 tablet in the morning if needed for nausea and vomiting 05/08/17   Cheral Marker, CNM  omeprazole (PRILOSEC) 20 MG capsule Take 1 capsule (20 mg total) by mouth 2 (two) times daily before a meal. 05/11/17   Lavera Guise, MD  sucralfate (CARAFATE) 1 g tablet Take 1 tablet (1 g total) by mouth 4 (four) times daily -  with meals and at bedtime. 05/11/17   Lavera Guise, MD    Family History Family History  Problem Relation Age of Onset  . Cancer Maternal Grandfather        skin  . Pyloric stenosis Son   . Irritable bowel syndrome Paternal Aunt   . Crohn's disease Paternal Aunt   . Heart disease Maternal Aunt   . Heart disease Maternal Uncle     Social History Social History  Substance Use Topics  . Smoking status: Never Smoker  . Smokeless tobacco: Never Used  . Alcohol use No     Allergies  Patient has no known allergies.   Review of Systems Review of Systems  Constitutional: Negative for fever.  Gastrointestinal: Positive for abdominal pain. Negative for diarrhea, melena, nausea and vomiting.  All other systems reviewed and are negative.    Physical Exam Updated Vital Signs BP 95/69 (BP Location: Right Arm)   Pulse 78   Temp 98.4 F (36.9 C) (Oral)   Resp 16   Ht  (1.727 m)   Wt 80.3 kg (177 lb)   LMP 01/30/2017 (Approximate)   SpO2 99%   BMI 26.91 kg/m   Physical Exam Physical Exam  Nursing note and vitals reviewed. Constitutional: Well developed, well nourished, non-toxic, and in no acute distress Head: Normocephalic and atraumatic.    Mouth/Throat: Oropharynx is clear and moist.  Neck: Normal range of motion. Neck supple.  Cardiovascular: Normal rate and regular rhythm.   Pulmonary/Chest: Effort normal and breath sounds normal.  Abdominal: Soft. There is mild epigastric tenderness. There is no rebound and no guarding.  Musculoskeletal: Normal range of motion.  Neurological: Alert, no facial droop, fluent speech, moves all extremities symmetrically Skin: Skin is warm and dry.  Psychiatric: Cooperative   ED Treatments / Results  Labs (all labs ordered are listed, but only abnormal results are displayed) Labs Reviewed  COMPREHENSIVE METABOLIC PANEL - Abnormal; Notable for the following:       Result Value   Sodium 134 (*)    Glucose, Bld 120 (*)    AST 49 (*)    Alkaline Phosphatase 164 (*)    All other components within normal limits  CBC - Abnormal; Notable for the following:    WBC 14.6 (*)    All other components within normal limits  URINALYSIS, ROUTINE W REFLEX MICROSCOPIC - Abnormal; Notable for the following:    APPearance HAZY (*)    All other components within normal limits  LIPASE, BLOOD    EKG  EKG Interpretation None       Radiology No results found.  Procedures Procedures (including critical care time)  Medications Ordered in ED Medications  gi cocktail (Maalox,Lidocaine,Donnatal) (30 mLs Oral Given 05/11/17 1203)     Initial Impression / Assessment and Plan / ED Course  I have reviewed the triage vital signs and the nursing notes.  Pertinent labs & imaging results that were available during my care of the patient were reviewed by me and considered in my medical decision making (see chart for details).     Presents with epigastric abdominal pain in pregnancy. She is well-appearing. Her vital signs are not concerning. She has a soft and benign abdomen. Minimal epigastric tenderness is noted. Blood work reassuring. Mildly elevated AST, but this is chronic (seen in 2017). UA  unremarkable. No significant RUQ tenderness, but did order outpatient ultrasound to be performed to rule out gallbladder pathology. Improved symptoms with GI cocktail. Her presentation is consistent with likely GERD or gastritis/esophagitis. I will change her to omeprazole. Sucralfate also added. Food modifications discussed. Strict return and follow-up instructions reviewed. She expressed understanding of all discharge instructions and felt comfortable with the plan of care.   Final Clinical Impressions(s) / ED Diagnoses   Final diagnoses:  Abdominal pain  Epigastric abdominal pain    New Prescriptions New Prescriptions   OMEPRAZOLE (PRILOSEC) 20 MG CAPSULE    Take 1 capsule (20 mg total) by mouth 2 (two) times daily before a meal.   SUCRALFATE (CARAFATE) 1 G TABLET    Take 1 tablet (1 g total)  by mouth 4 (four) times daily -  with meals and at bedtime.     Lavera Guise, MD 05/11/17 [phone]

## 2017-05-11 NOTE — ED Triage Notes (Addendum)
Pt reports continued intense epigastric pain that has been constant since last night. Pt reports was seen for same yesterday morning and was told "acid reflux". Pt reports prior to yesterday pain intermittent. Pt denies vaginal discharge or vaginal bleeding. Pt reports is [redacted] weeks pregnant and is being seen by [facility].

## 2017-05-11 NOTE — ED Notes (Signed)
MD made aware that [place] is not available at this time.

## 2017-05-12 ENCOUNTER — Encounter (HOSPITAL_COMMUNITY): Payer: Self-pay | Admitting: *Deleted

## 2017-05-12 ENCOUNTER — Inpatient Hospital Stay (HOSPITAL_COMMUNITY): Payer: 59

## 2017-05-12 ENCOUNTER — Inpatient Hospital Stay (HOSPITAL_COMMUNITY)
Admission: AD | Admit: 2017-05-12 | Discharge: 2017-05-13 | Disposition: A | Discharge status: Home / Self Care | Payer: 59 | Source: Ambulatory Visit | Attending: Obstetrics & Gynecology | Admitting: Obstetrics & Gynecology

## 2017-05-12 DIAGNOSIS — O26892 Other specified pregnancy related conditions, second trimester: Secondary | ICD-10-CM | POA: Diagnosis not present

## 2017-05-12 DIAGNOSIS — O210 Mild hyperemesis gravidarum: Secondary | ICD-10-CM | POA: Insufficient documentation

## 2017-05-12 DIAGNOSIS — R109 Unspecified abdominal pain: Secondary | ICD-10-CM

## 2017-05-12 DIAGNOSIS — O99611 Diseases of the digestive system complicating pregnancy, first trimester: Secondary | ICD-10-CM | POA: Insufficient documentation

## 2017-05-12 DIAGNOSIS — R1011 Right upper quadrant pain: Secondary | ICD-10-CM

## 2017-05-12 DIAGNOSIS — K219 Gastro-esophageal reflux disease without esophagitis: Secondary | ICD-10-CM | POA: Insufficient documentation

## 2017-05-12 DIAGNOSIS — Z3A13 13 weeks gestation of pregnancy: Secondary | ICD-10-CM | POA: Insufficient documentation

## 2017-05-12 DIAGNOSIS — O219 Vomiting of pregnancy, unspecified: Secondary | ICD-10-CM | POA: Diagnosis not present

## 2017-05-12 DIAGNOSIS — K8050 Calculus of bile duct without cholangitis or cholecystitis without obstruction: Secondary | ICD-10-CM

## 2017-05-12 DIAGNOSIS — O26899 Other specified pregnancy related conditions, unspecified trimester: Secondary | ICD-10-CM

## 2017-05-12 LAB — CBC WITH DIFFERENTIAL/PLATELET
BASOS ABS: 0 10*3/uL (ref 0.0–0.1)
BASOS PCT: 0 %
EOS ABS: 0 10*3/uL (ref 0.0–0.7)
Eosinophils Relative: 0 %
HEMATOCRIT: 35.3 % — AB (ref 36.0–46.0)
HEMOGLOBIN: 11.4 g/dL — AB (ref 12.0–15.0)
Lymphocytes Relative: 10 %
Lymphs Abs: 1.6 10*3/uL (ref 0.7–4.0)
MCH: 26.1 pg (ref 26.0–34.0)
MCHC: 32.3 g/dL (ref 30.0–36.0)
MCV: 81 fL (ref 78.0–100.0)
Monocytes Absolute: 0.3 10*3/uL (ref 0.1–1.0)
Monocytes Relative: 2 %
NEUTROS ABS: 13.4 10*3/uL — AB (ref 1.7–7.7)
NEUTROS PCT: 88 %
Platelets: 325 10*3/uL (ref 150–400)
RBC: 4.36 MIL/uL (ref 3.87–5.11)
RDW: 15.5 % (ref 11.5–15.5)
WBC: 15.3 10*3/uL — ABNORMAL HIGH (ref 4.0–10.5)

## 2017-05-12 LAB — URINALYSIS, ROUTINE W REFLEX MICROSCOPIC
Bilirubin Urine: NEGATIVE
GLUCOSE, UA: NEGATIVE mg/dL
HGB URINE DIPSTICK: NEGATIVE
KETONES UR: NEGATIVE mg/dL
NITRITE: NEGATIVE
PH: 6 (ref 5.0–8.0)
PROTEIN: NEGATIVE mg/dL
Specific Gravity, Urine: 1.008 (ref 1.005–1.030)

## 2017-05-12 LAB — AMYLASE: Amylase: 63 U/L (ref 28–100)

## 2017-05-12 LAB — COMPREHENSIVE METABOLIC PANEL
ALT: 31 U/L (ref 14–54)
AST: 52 U/L — AB (ref 15–41)
Albumin: 3.2 g/dL — ABNORMAL LOW (ref 3.5–5.0)
Alkaline Phosphatase: 140 U/L — ABNORMAL HIGH (ref 38–126)
Anion gap: 6 (ref 5–15)
BUN: 6 mg/dL (ref 6–20)
CHLORIDE: 102 mmol/L (ref 101–111)
CO2: 25 mmol/L (ref 22–32)
CREATININE: 0.49 mg/dL (ref 0.44–1.00)
Calcium: 8.8 mg/dL — ABNORMAL LOW (ref 8.9–10.3)
Glucose, Bld: 131 mg/dL — ABNORMAL HIGH (ref 65–99)
POTASSIUM: 3.8 mmol/L (ref 3.5–5.1)
SODIUM: 133 mmol/L — AB (ref 135–145)
Total Bilirubin: 0.4 mg/dL (ref 0.3–1.2)
Total Protein: 7 g/dL (ref 6.5–8.1)

## 2017-05-12 LAB — LIPASE, BLOOD: LIPASE: 29 U/L (ref 11–51)

## 2017-05-12 MED ORDER — FENTANYL CITRATE (PF) 100 MCG/2ML IJ SOLN
100.0000 ug | Freq: Once | INTRAMUSCULAR | Status: AC
  Administered 2017-05-12: 100 ug via INTRAMUSCULAR
  Filled 2017-05-12: qty 2

## 2017-05-12 NOTE — MAU Provider Note (Signed)
Chief Complaint: Abdominal Pain   First Provider Initiated Contact with Patient 05/12/17 2112     SUBJECTIVE HPI: Melanie Price is a 26 y.o. G3P1011 at [redacted]w[redacted]d who presents to Maternity Admissions reporting severe upper abd pain x 4 days associated w/ new-onset N/V/D. Denies fever, chills. No Hx Gall bladder problems. Seen twice for this problem in in past 3 days. Dx. GERD. Minimal improvement in pain w/ Prilosec and Carafate. Pain got much worse in past 6 hours. No improvement in pain w./ Tylenol. Has not been able to eat anything since breakfast today. Ate eggs. Has had nausea so far this pregnancy, but no vomiting until the past four days. NBNB Emesis ~2-3 x per days. Non-bloody diarrhea ~2 x per day.   States pain was so bad that it wrapped around her sides and back.   Quality: squeezing Severity: 10/10 on pain scale Associated signs and symptoms: Neg for fever, chills. Pos for N/V/D.   Past Medical History:  Diagnosis Date  . Anemia   . Bleeding in early pregnancy 12/19/2015  . Contraceptive management 09/26/2015  . Encounter for Nexplanon removal 09/26/2015  . Miscarriage 12/21/2015   OB History  Gravida Para Term Preterm AB Living  SAB TAB Ectopic Multiple Live Births  1     0 1    # Outcome Date GA Lbr Len/2nd Weight Sex Delivery Anes PTL Lv  3 Current           2 SAB 11/2015 [redacted]w[redacted]d         1 Term 06/17/14 [redacted]w[redacted]d / 01:36 7 lb 2.6 oz (3.25 kg) M Vag-Spont EPI N LIV     Birth Comments: none     Past Surgical History:  Procedure Laterality Date  . NO PAST SURGERIES     Social History   Social History  . Marital status: Married    Spouse name: N/A  . Number of children: N/A  . Years of education: N/A   Occupational History  . Not on file.   Social History Main Topics  . Smoking status: Never Smoker  . Smokeless tobacco: Never Used  . Alcohol use No  . Drug use: No  . Sexual activity: Yes    Birth control/ protection: None   Other Topics Concern  .  Not on file   Social History Narrative  . No narrative on file   Family History  Problem Relation Age of Onset  . Cancer Maternal Grandfather        skin  . Pyloric stenosis Son   . Irritable bowel syndrome Paternal Aunt   . Crohn's disease Paternal Aunt   . Heart disease Maternal Aunt   . Heart disease Maternal Uncle    No current facility-administered medications on file prior to encounter.    Current Outpatient Prescriptions on File Prior to Encounter  Medication Sig Dispense Refill  . acetaminophen (TYLENOL) 500 MG tablet Take 1,000 mg by mouth every 6 (six) hours as needed for mild pain.    . calcium carbonate (TUMS - DOSED IN MG ELEMENTAL CALCIUM) 500 MG chewable tablet Chew 1-2 tablets by mouth daily.    . Prenatal Vit-Fe Fumarate-FA (PRENATAL MULTIVITAMIN) TABS tablet Take 1 tablet by mouth daily at 12 noon.    . Doxylamine-Pyridoxine ER (BONJESTA) 20-20 MG TBCR Take 1 tablet by mouth at bedtime. Can add 1 tablet in the morning if needed for nausea and vomiting 60 tablet 8  No Known Allergies  I have reviewed patient's Past Medical Hx, Surgical Hx, Family Hx, Social Hx, medications and allergies.   Review of Systems  Constitutional: Negative for appetite change, chills and fever.  Gastrointestinal: Positive for abdominal pain, diarrhea, nausea and vomiting. Negative for abdominal distention, blood in stool and constipation.  Genitourinary: Negative for dysuria, flank pain, vaginal bleeding and vaginal discharge.    OBJECTIVE Patient Vitals for the past 24 hrs:  BP Temp Temp src Pulse Resp SpO2 Height Weight  05/12/17 2016 (!) 124/43 98.3 F (36.8 C) Oral 68 18 100 %  (1.727 m) 177 lb (80.3 kg)   Constitutional: Well-developed, well-nourished female in mild distress.  Cardiovascular: normal rate Respiratory: normal rate and effort.  GI: Abd soft, mod RUQ TTP, gravid appropriate for gestational age. Pos BS x 4.  MS: Extremities nontender, no edema, normal  ROM Neurologic: Alert and oriented x 4.  GU: Neg CVAT.  SPECULUM EXAM: Deferred  FHR 155 by doppler  LAB RESULTS Results for orders placed or performed during the hospital encounter of 05/12/17 (from the past 24 hour(s))  CBC with Differential/Platelet     Status: Abnormal   Collection Time: 05/12/17  9:58 PM  Result Value Ref Range   WBC 15.3 (H) 4.0 - 10.5 K/uL   RBC 4.36 3.87 - 5.11 MIL/uL   Hemoglobin 11.4 (L) 12.0 - 15.0 g/dL   HCT 16.1 (L) 09.6 - 04.5 %   MCV 81.0 78.0 - 100.0 fL   MCH 26.1 26.0 - 34.0 pg   MCHC 32.3 30.0 - 36.0 g/dL   RDW 40.9 81.1 - 91.4 %   Platelets 325 150 - 400 K/uL   Neutrophils Relative % 88 %   Neutro Abs 13.4 (H) 1.7 - 7.7 K/uL   Lymphocytes Relative 10 %   Lymphs Abs 1.6 0.7 - 4.0 K/uL   Monocytes Relative 2 %   Monocytes Absolute 0.3 0.1 - 1.0 K/uL   Eosinophils Relative 0 %   Eosinophils Absolute 0.0 0.0 - 0.7 K/uL   Basophils Relative 0 %   Basophils Absolute 0.0 0.0 - 0.1 K/uL  Amylase     Status: None   Collection Time: 05/12/17  9:58 PM  Result Value Ref Range   Amylase 63 28 - 100 U/L  Lipase, blood     Status: None   Collection Time: 05/12/17  9:58 PM  Result Value Ref Range   Lipase 29 11 - 51 U/L  Comprehensive metabolic panel     Status: Abnormal   Collection Time: 05/12/17  9:58 PM  Result Value Ref Range   Sodium 133 (L) 135 - 145 mmol/L   Potassium 3.8 3.5 - 5.1 mmol/L   Chloride 102 101 - 111 mmol/L   CO2 25 22 - 32 mmol/L   Glucose, Bld 131 (H) 65 - 99 mg/dL   BUN 6 6 - 20 mg/dL   Creatinine, Ser 7.82 0.44 - 1.00 mg/dL   Calcium 8.8 (L) 8.9 - 10.3 mg/dL   Total Protein 7.0 6.5 - 8.1 g/dL   Albumin 3.2 (L) 3.5 - 5.0 g/dL   AST 52 (H) 15 - 41 U/L   ALT 31 14 - 54 U/L   Alkaline Phosphatase 140 (H) 38 - 126 U/L   Total Bilirubin 0.4 0.3 - 1.2 mg/dL   GFR calc non Af Amer >60 >60 mL/min   GFR calc Af Amer >60 >60 mL/min   Anion gap 6 5 - 15  Urinalysis, Routine  w reflex microscopic     Status: Abnormal    Collection Time: 05/12/17 11:01 PM  Result Value Ref Range   Color, Urine STRAW (A) YELLOW   APPearance CLEAR CLEAR   Specific Gravity, Urine 1.008 1.005 - 1.030   pH 6.0 5.0 - 8.0   Glucose, UA NEGATIVE NEGATIVE mg/dL   Hgb urine dipstick NEGATIVE NEGATIVE   Bilirubin Urine NEGATIVE NEGATIVE   Ketones, ur NEGATIVE NEGATIVE mg/dL   Protein, ur NEGATIVE NEGATIVE mg/dL   Nitrite NEGATIVE NEGATIVE   Leukocytes, UA SMALL (A) NEGATIVE   RBC / HPF 0-5 0 - 5 RBC/hpf   WBC, UA 0-5 0 - 5 WBC/hpf   Bacteria, UA RARE (A) NONE SEEN   Squamous Epithelial / LPF 0-5 (A) NONE SEEN    IMAGING US Abdomen Limited Ruq  Result Date: 05/12/2017 CLINICAL DATA:  Right upper quadrant pain with nausea and vomiting tonight. EXAM: ULTRASOUND ABDOMEN LIMITED RIGHT UPPER QUADRANT COMPARISON:  None. FINDINGS: Gallbladder: No gallstones or wall thickening visualized. No sonographic Murphy sign noted by sonographer. Common bile duct: Diameter: Normal, 2.2 mm. Liver: No focal lesion identified. Within normal limits in parenchymal echogenicity. Portal vein is patent on color Doppler imaging with normal direction of blood flow towards the liver. IMPRESSION: Normal liver, gallbladder and bile ducts. Electronically Signed   By: Ellery Plunk M.D.   On: 05/12/2017 23:48    MAU COURSE Orders Placed This Encounter  Procedures  . US ABDOMEN LIMITED RUQ  . CBC with Differential/Platelet  . Amylase  . Lipase, blood  . Comprehensive metabolic panel  . Urinalysis, Routine w reflex microscopic   Meds ordered this encounter  Medications  . fentaNYL (SUBLIMAZE) injection 100 mcg  . promethazine (PHENERGAN) tablet 25 mg  . promethazine (PHENERGAN) 25 MG tablet    Sig: Take 1 tablet (25 mg total) by mouth every 6 (six) hours as needed for nausea or vomiting.    Dispense:  30 tablet    Refill:  1    Order Specific Question:   Supervising Provider    Answer:   Duane Lope H [[id number]]   Reported dysuria x 1 while in MAU.  UA collected, Nml. Discussed Hx, labs, exam w/ Dr. Despina Hidden. Agrees w/ POC. New orders: Phenergan.    MDM - RUQ pain and mild leukocytosis w/ out evidence of cholecystitis or cholelithiasis. DD includes functional gall bladder disorder, non-visualized gall stones, viral Gastroenteritis. Pain resolved w/ Fentanyl. Pt is non-toxic-appearing. Will Rx Phenergan per consult w/ Dr. Despina Hidden.   ASSESSMENT 1. Biliary colic   2. RUQ pain   3. Nausea and vomiting during pregnancy   4. Abdominal pain affecting pregnancy     PLAN Discharge home in stable condition. cholecystitis precautions Follow-up Information    [facility] Follow up on 06/05/2017.   Specialty:  Obstetrics and Gynecology Why:  as scheduled or sooner as needed if no improvment by 05/16/17.  Contact information: [address] [place] [address] [phone]       [facility] EMERGENCY DEPARTMENT Follow up.   Specialty:  Emergency Medicine Why:  in emergencies (severe upper abdominal pain, fever >100.4) Contact information: [address] [id number] mc [place] [address] [phone]         Allergies as of 05/13/2017   No Known Allergies     Medication List    TAKE these medications   acetaminophen 500 MG tablet Commonly known as:  TYLENOL Take 1,000 mg  by mouth every 6 (six) hours as needed for mild pain.   calcium carbonate 500 MG chewable tablet Commonly known as:  TUMS - dosed in mg elemental calcium Chew 1-2 tablets by mouth daily.   Doxylamine-Pyridoxine ER 20-20 MG Tbcr Commonly known as:  BONJESTA Take 1 tablet by mouth at bedtime. Can add 1 tablet in the morning if needed for nausea and vomiting   omeprazole 20 MG capsule Commonly known as:  PRILOSEC Take 1 capsule (20 mg total) by mouth 2 (two) times daily before a meal.   prenatal multivitamin Tabs tablet Take 1 tablet by mouth daily at 12 noon.   promethazine 25 MG  tablet Commonly known as:  PHENERGAN Take 1 tablet (25 mg total) by mouth every 6 (six) hours as needed for nausea or vomiting.   sucralfate 1 g tablet Commonly known as:  CARAFATE Take 1 tablet (1 g total) by mouth 4 (four) times daily -  with meals and at bedtime.        Katrinka Blazing, [place], CNM 05/13/2017  12:45 AM

## 2017-05-12 NOTE — MAU Note (Signed)
Pt here with c/o severe abdominal pain, started about 4 days ago. Denies any bleeding.

## 2017-05-13 ENCOUNTER — Telehealth: Payer: Self-pay | Admitting: Obstetrics & Gynecology

## 2017-05-13 DIAGNOSIS — R109 Unspecified abdominal pain: Secondary | ICD-10-CM | POA: Diagnosis not present

## 2017-05-13 DIAGNOSIS — O26892 Other specified pregnancy related conditions, second trimester: Secondary | ICD-10-CM | POA: Diagnosis not present

## 2017-05-13 DIAGNOSIS — R1011 Right upper quadrant pain: Secondary | ICD-10-CM

## 2017-05-13 DIAGNOSIS — O219 Vomiting of pregnancy, unspecified: Secondary | ICD-10-CM

## 2017-05-13 DIAGNOSIS — K8050 Calculus of bile duct without cholangitis or cholecystitis without obstruction: Secondary | ICD-10-CM

## 2017-05-13 MED ORDER — PROMETHAZINE HCL 25 MG PO TABS
25.0000 mg | ORAL_TABLET | Freq: Once | ORAL | Status: AC
  Administered 2017-05-13: 25 mg via ORAL
  Filled 2017-05-13: qty 1

## 2017-05-13 MED ORDER — PROMETHAZINE HCL 25 MG PO TABS: 25.0000 mg | ORAL_TABLET | Freq: Four times a day (QID) | ORAL | 1 refills | Status: DC | PRN

## 2017-05-13 NOTE — Telephone Encounter (Signed)
Left message x 1. JSY 

## 2017-05-13 NOTE — Telephone Encounter (Signed)
Spoke with pt advising her to call MAU at [facility] and ask about the pain med. Pt voiced understanding. JSY

## 2017-05-13 NOTE — Discharge Instructions (Signed)
Biliary Colic, Adult °Biliary colic is severe pain caused by a problem with a small organ in the upper right part of your belly (gallbladder). The gallbladder stores a digestive fluid produced in the liver (bile) that helps the body break down fat. Bile and other digestive enzymes are carried from the liver to the small intestine though tube-like structures (bile ducts). The gallbladder and the bile ducts form the biliary tract. °Sometimes hard deposits of digestive fluids form in the gallbladder (gallstones) and block the flow of bile from the gallbladder, causing biliary colic. This condition is also called a gallbladder attack. Gallstones can be as small as a grain of sand or as big as a golf ball. There could be just one gallstone in the gallbladder, or there could be many. °What are the causes? °Biliary colic is usually caused by gallstones. Less often, a tumor could block the flow of bile from the gallbladder and trigger biliary colic. °What increases the risk? °This condition is more likely to develop in: °· Women. °· People of Hispanic descent. °· People with a family history of gallstones. °· People who are obese. °· People who suddenly or quickly lose weight. °· People who eat a high-calorie, low-fiber diet that is rich in refined carbs (carbohydrates), such as white bread and white rice. °· People who have an intestinal disease that affects nutrient absorption, such as Crohn disease. °· People who have a metabolic condition, such as metabolic syndrome or diabetes. ° °What are the signs or symptoms? °Severe pain in the upper right side of the belly is the main symptom of biliary colic. You may feel this pain below the chest but above the hip. This pain often occurs at night or after eating a very fatty meal. This pain may get worse for up to an hour and last as long as 12 hours. In most cases, the pain fades (subsides) within a couple hours. °Other symptoms of this condition include: °· Nausea and  vomiting. °· Pain under the right shoulder. ° °How is this diagnosed? °This condition is diagnosed based on your medical history, your symptoms, and a physical exam. You may have tests, including: °· Blood tests to rule out infection or inflammation of the bile ducts, gallbladder, pancreas, or liver. °· Imaging studies such as: °? Ultrasound. °? CT scan. °? MRI. ° °In some cases, you may need to have an imaging study done using a small amount of radioactive material (nuclear medicine) to confirm the diagnosis. °How is this treated? °Treatment for this condition may include medicine to relieve your pain or nausea. If you have gallstones that are causing biliary colic, you may need surgery to remove the gallbladder (cholecystectomy). Gallstones can also be dissolved gradually with medicine. It may take months or years before the gallstones are completely gone. °Follow these instructions at home: °· Take over-the-counter and prescription medicines only as told by your health care provider. °· Drink enough fluid to keep your urine clear or pale yellow. °· Follow instructions from your health care provider about eating or drinking restrictions. These may include avoiding: °? Fatty, greasy, and fried foods. °? Any foods that make the pain worse. °? Overeating. °? Having a large meal after not eating for a while. °· Keep all follow-up visits as told by your health care provider. This is important. °How is this prevented? °Steps to prevent this condition include: °· Maintaining a healthy body weight. °· Getting regular exercise. °· Eating a healthy, high-fiber, low-fat diet. °· Limiting   how much sugar and refined carbs you eat, such as sweets, white flour, and white rice. ° °Contact a health care provider if: °· Your pain lasts more than 5 hours. °· You vomit. °· You have a fever and chills. °· Your pain gets worse. °Get help right away if: °· Your skin or the whites of your eyes look yellow (jaundice). °· Your have  tea-colored urine and light-colored stools. °· You are dizzy or you faint. °This information is not intended to replace advice given to you by your health care provider. Make sure you discuss any questions you have with your health care provider. °Document Released: 12/24/2005 Document Revised: 03/20/2016 Document Reviewed: 02/06/2016 °Elsevier Interactive Patient Education © 2018 Elsevier Inc. ° °

## 2017-05-14 ENCOUNTER — Telehealth: Payer: Self-pay | Admitting: *Deleted

## 2017-05-14 NOTE — Telephone Encounter (Signed)
Patient called with continued complaints of abdominal pain, nausea and vomiting. She stated the pain is not any better. Was seen at [facility] on 05/12/17 and was told to monitor for cholestasis. Will get patient in with Dr Emelda Fear tomorrow.

## 2017-05-15 ENCOUNTER — Ambulatory Visit (INDEPENDENT_AMBULATORY_CARE_PROVIDER_SITE_OTHER): Payer: 59 | Admitting: Obstetrics and Gynecology

## 2017-05-15 ENCOUNTER — Encounter: Payer: Self-pay | Admitting: Obstetrics and Gynecology

## 2017-05-15 VITALS — BP 102/60 | HR 76 | Wt 175.8 lb

## 2017-05-15 DIAGNOSIS — O2341 Unspecified infection of urinary tract in pregnancy, first trimester: Secondary | ICD-10-CM

## 2017-05-15 DIAGNOSIS — O2342 Unspecified infection of urinary tract in pregnancy, second trimester: Secondary | ICD-10-CM

## 2017-05-15 DIAGNOSIS — Z3A13 13 weeks gestation of pregnancy: Secondary | ICD-10-CM

## 2017-05-15 DIAGNOSIS — Z331 Pregnant state, incidental: Secondary | ICD-10-CM

## 2017-05-15 DIAGNOSIS — Z3481 Encounter for supervision of other normal pregnancy, first trimester: Secondary | ICD-10-CM

## 2017-05-15 DIAGNOSIS — Z1389 Encounter for screening for other disorder: Secondary | ICD-10-CM

## 2017-05-15 LAB — POCT URINALYSIS DIPSTICK
GLUCOSE UA: NEGATIVE
KETONES UA: NEGATIVE
Nitrite, UA: NEGATIVE
Protein, UA: NEGATIVE

## 2017-05-15 MED ORDER — HYDROCODONE-ACETAMINOPHEN 5-325 MG PO TABS: 1.0000 | ORAL_TABLET | Freq: Four times a day (QID) | ORAL | 0 refills | Status: DC | PRN

## 2017-05-15 MED ORDER — CEPHALEXIN 500 MG PO CAPS: 500.0000 mg | ORAL_CAPSULE | Freq: Four times a day (QID) | ORAL | 0 refills | Status: DC

## 2017-05-15 NOTE — Addendum Note (Signed)
Addended by: Colen Darling on: 05/15/2017 03:21 PM   Modules accepted: Orders

## 2017-05-15 NOTE — Patient Instructions (Signed)
uti Urinary Tract Infection, Adult A urinary tract infection (UTI) is an infection of any part of the urinary tract, which includes the kidneys, ureters, bladder, and urethra. These organs make, store, and get rid of urine in the body. UTI can be a bladder infection (cystitis) or kidney infection (pyelonephritis). What are the causes? This infection may be caused by fungi, viruses, or bacteria. Bacteria are the most common cause of UTIs. This condition can also be caused by repeated incomplete emptying of the bladder during urination. What increases the risk? This condition is more likely to develop if:  You ignore your need to urinate or hold urine for long periods of time.  You do not empty your bladder completely during urination.  You wipe back to front after urinating or having a bowel movement, if you are female.  You are uncircumcised, if you are female.  You are constipated.  You have a urinary catheter that stays in place (indwelling).  You have a weak defense (immune) system.  You have a medical condition that affects your bowels, kidneys, or bladder.  You have diabetes.  You take antibiotic medicines frequently or for long periods of time, and the antibiotics no longer work well against certain types of infections (antibiotic resistance).  You take medicines that irritate your urinary tract.  You are exposed to chemicals that irritate your urinary tract.  You are female.  What are the signs or symptoms? Symptoms of this condition include:  Fever.  Frequent urination or passing small amounts of urine frequently.  Needing to urinate urgently.  Pain or burning with urination.  Urine that smells bad or unusual.  Cloudy urine.  Pain in the lower abdomen or back.  Trouble urinating.  Blood in the urine.  Vomiting or being less hungry than normal.  Diarrhea or abdominal pain.  Vaginal discharge, if you are female.  How is this diagnosed? This condition  is diagnosed with a medical history and physical exam. You will also need to provide a urine sample to test your urine. Other tests may be done, including:  Blood tests.  Sexually transmitted disease (STD) testing.  If you have had more than one UTI, a cystoscopy or imaging studies may be done to determine the cause of the infections. How is this treated? Treatment for this condition often includes a combination of two or more of the following:  Antibiotic medicine.  Other medicines to treat less common causes of UTI.  Over-the-counter medicines to treat pain.  Drinking enough water to stay hydrated.  Follow these instructions at home:  Take over-the-counter and prescription medicines only as told by your health care provider.  If you were prescribed an antibiotic, take it as told by your health care provider. Do not stop taking the antibiotic even if you start to feel better.  Avoid alcohol, caffeine, tea, and carbonated beverages. They can irritate your bladder.  Drink enough fluid to keep your urine clear or pale yellow.  Keep all follow-up visits as told by your health care provider. This is important.  Make sure to: ? Empty your bladder often and completely. Do not hold urine for long periods of time. ? Empty your bladder before and after sex. ? Wipe from front to back after a bowel movement if you are female. Use each tissue one time when you wipe. Contact a health care provider if:  You have back pain.  You have a fever.  You feel nauseous or vomit.  Your symptoms do   not get better after 3 days.  Your symptoms go away and then return. Get help right away if:  You have severe back pain or lower abdominal pain.  You are vomiting and cannot keep down any medicines or water. This information is not intended to replace advice given to you by your health care provider. Make sure you discuss any questions you have with your health care provider. Document Released:  05/02/2005 Document Revised: 01/04/2016 Document Reviewed: 06/13/2015 Elsevier Interactive Patient Education  2017 Elsevier Inc.  

## 2017-05-15 NOTE — Progress Notes (Signed)
Melanie Price is a 26 y.o. female G74P1011  Estimated Date of Delivery: 11/17/17 LROB [redacted]w[redacted]d  Chief Complaint  Patient presents with  . 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  Melanie Price is a 26 y.o. female here for intermittent upper abdominal pain that radiates to her middle back with onset 5 days ago. Pt has not seen any correlation with her eating habits. She says the pain has random timing, and lasts up to 6 hours at a time. Pt is unable to determine specifically where the pain is coming from in her abdomen. Associated symptoms of nausea, vomiting, diarrhea, fever. Pt has taken phenergan with some relief. No alleviating factors noted. Last night she was kept awake from 10pm-4am due to pain. Pt ate dinner last night around 5pm.The day before, pain appeared in the afternoon and lasted until 10pm. Pt is not in pain currently. Pt has been running a fever for a few days with a recorded temp. of 100.1 for a day.  Patient reports good fetal movement,                          denies any bleeding , rupture of membranes,or regular contractions.  Blood pressure 102/60, pulse 76, weight 175 lb 12.8 oz (79.7 kg), last menstrual period 01/30/2017.   Urine results: trace blood and  leukocytes refer to the ob flow sheet for FH and FHR, ,                          Physical Examination: General appearance - alert, well appearing, and in no distress                                      Abdomen - FH not done                                                        -FHR 156                                                        soft, nontender, nondistended, no masses or organomegal - no CVA tenderness bilaterally -negative murphy sign                                      Pelvic - not indicated               Urine dipstick shows positive for RBC's.  Micro exam: not  done., culture sent..                              Questions were answered. Assessment: LROB G3P1011 @ [redacted]w[redacted]d Estimated Date of Delivery: 11/17/17  Plan:  Continued routine obstetrical care, LROB Prescribe Keflex 4/day for possible infection, Vicodin for pain continue phenergan for nausea.  F/u as as scheduled on 06/05/17 for LROB   By signing my name below, I, Izna Ahmed, attest that this documentation has been prepared under the direction and in the presence of Tilda Burrow, MD. Electronically Signed: Redge Gainer, Medical Scribe. 05/15/17. 3:01 PM.  I personally performed the services described in this documentation, which was SCRIBED in my presence. The recorded information has been reviewed and considered accurate. It has been edited as necessary during review. Tilda Burrow, MD

## 2017-05-17 LAB — URINE CULTURE

## 2017-06-05 ENCOUNTER — Ambulatory Visit (INDEPENDENT_AMBULATORY_CARE_PROVIDER_SITE_OTHER): Payer: 59 | Admitting: Advanced Practice Midwife

## 2017-06-05 ENCOUNTER — Encounter: Payer: Self-pay | Admitting: Advanced Practice Midwife

## 2017-06-05 VITALS — BP 100/60 | HR 97 | Wt 179.0 lb

## 2017-06-05 DIAGNOSIS — Z3A16 16 weeks gestation of pregnancy: Secondary | ICD-10-CM

## 2017-06-05 DIAGNOSIS — Z1379 Encounter for other screening for genetic and chromosomal anomalies: Secondary | ICD-10-CM

## 2017-06-05 DIAGNOSIS — Z3482 Encounter for supervision of other normal pregnancy, second trimester: Secondary | ICD-10-CM

## 2017-06-05 DIAGNOSIS — Z363 Encounter for antenatal screening for malformations: Secondary | ICD-10-CM

## 2017-06-05 DIAGNOSIS — Z1389 Encounter for screening for other disorder: Secondary | ICD-10-CM

## 2017-06-05 DIAGNOSIS — Z331 Pregnant state, incidental: Secondary | ICD-10-CM

## 2017-06-05 LAB — POCT URINALYSIS DIPSTICK
Blood, UA: NEGATIVE
Glucose, UA: NEGATIVE
KETONES UA: NEGATIVE
LEUKOCYTES UA: NEGATIVE
NITRITE UA: NEGATIVE
PROTEIN UA: NEGATIVE

## 2017-06-05 NOTE — Progress Notes (Signed)
[id number] Estimated Date of Delivery: 11/17/17  Blood pressure 100/60, pulse 97, weight 179 lb (81.2 kg), last menstrual period 01/30/2017.   BP weight and urine results all reviewed and noted.  Feels like she eats fine. Not sure why she hasn't gained weight.   Please refer to the obstetrical flow sheet for the fundal height and fetal heart rate documentation:  Patient denies any bleeding and no rupture of membranes symptoms or regular contractions. Patient is without complaints. All questions were answered.  Orders Placed This Encounter  Procedures  . US OB Comp + 14 Wk  . INTEGRATED 2  . POCT urinalysis dipstick    Plan:  Continued routine obstetrical care, 2nd TI today  Return in about 3 weeks (around 06/26/2017) for LROB, NW:[id number]:Anatomy.

## 2017-06-05 NOTE — Patient Instructions (Signed)

## 2017-06-11 LAB — INTEGRATED 2
AFP MARKER: 48.5 ng/mL
AFP MoM: 1.58
CROWN RUMP LENGTH: 63.6 mm
DIA MOM: 0.97
DIA Value: 144.8 pg/mL
Estriol, Unconjugated: 1.29 ng/mL
GESTATIONAL AGE: 16.9 wk
Gest. Age on Collection Date: 12.7 weeks
HCG MOM: 0.58
Maternal Age at EDD: 25.8 yr
NUCHAL TRANSLUCENCY MOM: 0.81
Nuchal Translucency (NT): 1.3 mm
Number of Fetuses: 1
PAPP-A MOM: 0.8
PAPP-A VALUE: 663.1 ng/mL
TEST RESULTS: NEGATIVE
Weight: 177 [lb_av]
Weight: 179 [lb_av]
hCG Value: 14.8 IU/mL
uE3 MoM: 1.47

## 2017-06-17 ENCOUNTER — Emergency Department (HOSPITAL_COMMUNITY)
Admission: EM | Admit: 2017-06-17 | Discharge: 2017-06-17 | Disposition: A | Discharge status: Home / Self Care | Payer: 59 | Attending: Emergency Medicine | Admitting: Emergency Medicine

## 2017-06-17 ENCOUNTER — Encounter (HOSPITAL_COMMUNITY): Payer: Self-pay | Admitting: Emergency Medicine

## 2017-06-17 ENCOUNTER — Other Ambulatory Visit: Payer: Self-pay

## 2017-06-17 DIAGNOSIS — N898 Other specified noninflammatory disorders of vagina: Secondary | ICD-10-CM | POA: Diagnosis not present

## 2017-06-17 DIAGNOSIS — Z3A18 18 weeks gestation of pregnancy: Secondary | ICD-10-CM | POA: Insufficient documentation

## 2017-06-17 DIAGNOSIS — R1084 Generalized abdominal pain: Secondary | ICD-10-CM | POA: Diagnosis not present

## 2017-06-17 DIAGNOSIS — O26892 Other specified pregnancy related conditions, second trimester: Secondary | ICD-10-CM

## 2017-06-17 DIAGNOSIS — R109 Unspecified abdominal pain: Secondary | ICD-10-CM

## 2017-06-17 DIAGNOSIS — Z79899 Other long term (current) drug therapy: Secondary | ICD-10-CM | POA: Insufficient documentation

## 2017-06-17 LAB — WET PREP, GENITAL
CLUE CELLS WET PREP: NONE SEEN
Sperm: NONE SEEN
Trich, Wet Prep: NONE SEEN
Yeast Wet Prep HPF POC: NONE SEEN

## 2017-06-17 LAB — URINALYSIS, ROUTINE W REFLEX MICROSCOPIC
Bilirubin Urine: NEGATIVE
GLUCOSE, UA: NEGATIVE mg/dL
HGB URINE DIPSTICK: NEGATIVE
KETONES UR: NEGATIVE mg/dL
Leukocytes, UA: NEGATIVE
Nitrite: NEGATIVE
PH: 6 (ref 5.0–8.0)
Protein, ur: NEGATIVE mg/dL
Specific Gravity, Urine: 1.024 (ref 1.005–1.030)

## 2017-06-17 LAB — COMPREHENSIVE METABOLIC PANEL
ALBUMIN: 3.2 g/dL — AB (ref 3.5–5.0)
ALT: 29 U/L (ref 14–54)
AST: 49 U/L — AB (ref 15–41)
Alkaline Phosphatase: 152 U/L — ABNORMAL HIGH (ref 38–126)
Anion gap: 6 (ref 5–15)
BILIRUBIN TOTAL: 0.2 mg/dL — AB (ref 0.3–1.2)
BUN: 13 mg/dL (ref 6–20)
CALCIUM: 8.6 mg/dL — AB (ref 8.9–10.3)
CO2: 24 mmol/L (ref 22–32)
CREATININE: 0.64 mg/dL (ref 0.44–1.00)
Chloride: 103 mmol/L (ref 101–111)
GFR calc Af Amer: >60 mL/min (ref >60)
GFR calc non Af Amer: >60 mL/min (ref >60)
GLUCOSE: 89 mg/dL (ref 65–99)
Potassium: 3.9 mmol/L (ref 3.5–5.1)
SODIUM: 133 mmol/L — AB (ref 135–145)
TOTAL PROTEIN: 7.1 g/dL (ref 6.5–8.1)

## 2017-06-17 LAB — CBC WITH DIFFERENTIAL/PLATELET
BASOS PCT: 0 %
Basophils Absolute: 0 10*3/uL (ref 0.0–0.1)
EOS ABS: 0.1 10*3/uL (ref 0.0–0.7)
Eosinophils Relative: 1 %
HEMATOCRIT: 33.7 % — AB (ref 36.0–46.0)
HEMOGLOBIN: 11 g/dL — AB (ref 12.0–15.0)
Lymphocytes Relative: 16 %
Lymphs Abs: 2.4 10*3/uL (ref 0.7–4.0)
MCH: 26.6 pg (ref 26.0–34.0)
MCHC: 32.6 g/dL (ref 30.0–36.0)
MCV: 81.4 fL (ref 78.0–100.0)
MONOS PCT: 7 %
Monocytes Absolute: 1 10*3/uL (ref 0.1–1.0)
NEUTROS ABS: 11.6 10*3/uL — AB (ref 1.7–7.7)
NEUTROS PCT: 76 %
Platelets: 375 10*3/uL (ref 150–400)
RBC: 4.14 MIL/uL (ref 3.87–5.11)
RDW: 15.7 % — ABNORMAL HIGH (ref 11.5–15.5)
WBC: 15.3 10*3/uL — AB (ref 4.0–10.5)

## 2017-06-17 MED ORDER — ACETAMINOPHEN 325 MG PO TABS
650.0000 mg | ORAL_TABLET | Freq: Once | ORAL | Status: AC
  Administered 2017-06-17: 650 mg via ORAL
  Filled 2017-06-17: qty 2

## 2017-06-17 NOTE — ED Provider Notes (Signed)
[facility] EMERGENCY DEPARTMENT Provider Note   CSN: [id number] Arrival date & time: 06/17/17  2058     History   Chief Complaint Chief Complaint  Patient presents with  . Abdominal Pain    HPI Melanie Price is a 26 y.o. G3P1011 @ 107w1d gestation who presents to the ED with abdominal pain and brown vaginal discharge. Prenatal care with Dr. Emelda Fear. Last OB visit was 06/05/17 and everything was fine.  Patient reports having a sinus infection last with with sore throat,congestion and headache but that is much better.   The history is provided by the patient. No language interpreter was used.  Abdominal Pain   This is a new problem. The current episode started more than 2 days ago. Episode frequency: off and on. The problem has not changed since onset.The quality of the pain is cramping. The pain is moderate. Associated symptoms include nausea. Pertinent negatives include fever, diarrhea, vomiting, dysuria, frequency, headaches (hx of migraines) and myalgias. Nothing relieves the symptoms.    Past Medical History:  Diagnosis Date  . Anemia   . Bleeding in early pregnancy 12/19/2015  . Contraceptive management 09/26/2015  . Encounter for Nexplanon removal 09/26/2015  . Miscarriage 12/21/2015    Patient Active Problem List   Diagnosis Date Noted  . Supervision of normal pregnancy 04/16/2017    Past Surgical History:  Procedure Laterality Date  . NO PAST SURGERIES      OB History    Gravida Para Term Preterm AB Living   3 1 1   1 1    SAB TAB Ectopic Multiple Live Births   1     0 1       Home Medications    Prior to Admission medications   Medication Sig Start Date End Date Taking? Authorizing Provider  acetaminophen (TYLENOL) 500 MG tablet Take 1,000 mg by mouth every 6 (six) hours as needed for mild pain.    [provider]  cephALEXin (KEFLEX) 500 MG capsule Take 1 capsule (500 mg total) by mouth 4 (four) times daily. Patient not taking: Reported on  06/05/2017 05/15/17   Tilda Burrow, MD  Doxylamine-Pyridoxine ER (BONJESTA) 20-20 MG TBCR Take 1 tablet by mouth at bedtime. Can add 1 tablet in the morning if needed for nausea and vomiting Patient not taking: Reported on 06/05/2017 05/08/17   Cheral Marker, CNM  HYDROcodone-acetaminophen (NORCO/VICODIN) 5-325 MG tablet Take 1 tablet by mouth every 6 (six) hours as needed for moderate pain. May take with ibuprofen Patient not taking: Reported on 06/05/2017 05/15/17   Tilda Burrow, MD  omeprazole (PRILOSEC) 20 MG capsule Take 1 capsule (20 mg total) by mouth 2 (two) times daily before a meal. 05/11/17   Lavera Guise, MD  Prenatal Vit-Fe Fumarate-FA (PRENATAL MULTIVITAMIN) TABS tablet Take 1 tablet by mouth daily at 12 noon.    [provider]  promethazine (PHENERGAN) 25 MG tablet Take 1 tablet (25 mg total) by mouth every 6 (six) hours as needed for nausea or vomiting. Patient not taking: Reported on 06/05/2017 05/13/17   Katrinka Blazing, [place], CNM  sucralfate (CARAFATE) 1 g tablet Take 1 tablet (1 g total) by mouth 4 (four) times daily -  with meals and at bedtime. 05/11/17   Lavera Guise, MD    Family History Family History  Problem Relation Age of Onset  . Cancer Maternal Grandfather        skin  . Pyloric stenosis Son   . Irritable  bowel syndrome Paternal Aunt   . Crohn's disease Paternal Aunt   . Heart disease Maternal Aunt   . Heart disease Maternal Uncle     Social History Social History   Tobacco Use  . Smoking status: Never Smoker  . Smokeless tobacco: Never Used  Substance Use Topics  . Alcohol use: No  . Drug use: No     Allergies   Patient has no known allergies.   Review of Systems Review of Systems  Constitutional: Negative for chills and fever.  HENT: Positive for congestion, ear pain, sinus pressure and sore throat.   Eyes: Negative for visual disturbance.  Gastrointestinal: Positive for abdominal pain and nausea. Negative for diarrhea and  vomiting.  Genitourinary: Positive for vaginal discharge. Negative for dysuria, frequency and urgency.  Musculoskeletal: Negative for myalgias.  Skin: Negative for rash.  Neurological: Negative for light-headedness and headaches (hx of migraines).  Psychiatric/Behavioral: Negative for confusion. The patient is not nervous/anxious.      Physical Exam Updated Vital Signs BP 109/76   Pulse 85   Temp 98.3 F (36.8 C)   Resp 18   Ht 5\' 8"  (1.727 m)   Wt 81.2 kg (179 lb)   LMP 01/30/2017 (Approximate)   SpO2 100%   BMI 27.22 kg/m   Physical Exam  Constitutional: She is oriented to person, place, and time. She appears well-developed and well-nourished. No distress.  HENT:  Head: Normocephalic.  TM's occluded with cerumen.  Eyes: Conjunctivae and EOM are normal.  Neck: Trachea normal. Neck supple.  Cardiovascular: Normal rate and regular rhythm.  Pulmonary/Chest: Effort normal and breath sounds normal.  Abdominal: Soft. Bowel sounds are normal. There is generalized tenderness. There is no rebound, no guarding and no CVA tenderness.  + FHT's, abdomen gravid c/w dates  Genitourinary:  Genitourinary Comments: External genitalia without lesions. White d/c vaginal vault, no blood noted. Cervix closed, thick, high. Uterus c/w dates.  Musculoskeletal: Normal range of motion.  Neurological: She is alert and oriented to person, place, and time. No cranial nerve deficit.  Skin: Skin is warm and dry.  Psychiatric: She has a normal mood and affect. Her behavior is normal.  Nursing note and vitals reviewed.    ED Treatments / Results  Labs (all labs ordered are listed, but only abnormal results are displayed) Labs Reviewed  WET PREP, GENITAL - Abnormal; Notable for the following components:      Result Value   WBC, Wet Prep HPF POC MANY (*)    All other components within normal limits  CBC WITH DIFFERENTIAL/PLATELET - Abnormal; Notable for the following components:   WBC 15.3 (*)     Hemoglobin 11.0 (*)    HCT 33.7 (*)    RDW 15.7 (*)    Neutro Abs 11.6 (*)    All other components within normal limits  COMPREHENSIVE METABOLIC PANEL - Abnormal; Notable for the following components:   Sodium 133 (*)    Calcium 8.6 (*)    Albumin 3.2 (*)    AST 49 (*)    Alkaline Phosphatase 152 (*)    Total Bilirubin 0.2 (*)    All other components within normal limits  URINALYSIS, ROUTINE W REFLEX MICROSCOPIC  GC/CHLAMYDIA PROBE AMP ([place]) NOT AT [facility]   Radiology No results found.  Procedures Procedures (including critical care time)  Medications Ordered in ED Medications  acetaminophen (TYLENOL) tablet 650 mg (650 mg Oral Given 06/17/17 2309)   11:05 pm Consult with Dr. Emelda FearFerguson and he  will see the patient in the office in the morning at 8:30 am.   Initial Impression / Assessment and Plan / ED Course  I have reviewed the triage vital signs and the nursing notes.  26 y.o. G3P1011 @ 3079w1d gestation with generalized abdominal pain that increases with movement and certain positions stable for d/c without change in labs from one month ago except for slightly elevated AST. No acute abdomen at this time. Patient to f/u in the OB office at 8:30 am. Patient agrees with plan, tylenol as needed for pain. Patient appears comfortable at d/c.  Final Clinical Impressions(s) / ED Diagnoses   Final diagnoses:  Abdominal pain in pregnancy, second trimester    ED Discharge Orders    None       Kerrie Buffaloeese, Tovia Kisner GiffordM, NP 06/17/17 2321    Bethann BerkshireZammit, Joseph, MD 06/18/17 [phone]

## 2017-06-17 NOTE — Discharge Instructions (Signed)
I spoke with Dr. Emelda FearFerguson and he wants you to follow up in the office in the morning at 8:30 am.

## 2017-06-17 NOTE — ED Notes (Signed)
Pt's third pregnancy and last visit was on 10/31.  Pt states that she had informed MD then concerning her brown discharge.

## 2017-06-17 NOTE — ED Triage Notes (Signed)
Pt c/o abd pain with brown discharge at times.

## 2017-06-18 ENCOUNTER — Ambulatory Visit (INDEPENDENT_AMBULATORY_CARE_PROVIDER_SITE_OTHER): Payer: Medicaid Other | Admitting: Women's Health

## 2017-06-18 ENCOUNTER — Encounter: Payer: Self-pay | Admitting: Women's Health

## 2017-06-18 VITALS — BP 92/56 | HR 77 | Wt 181.5 lb

## 2017-06-18 DIAGNOSIS — Z3A18 18 weeks gestation of pregnancy: Secondary | ICD-10-CM

## 2017-06-18 DIAGNOSIS — O26899 Other specified pregnancy related conditions, unspecified trimester: Secondary | ICD-10-CM

## 2017-06-18 DIAGNOSIS — Z331 Pregnant state, incidental: Secondary | ICD-10-CM

## 2017-06-18 DIAGNOSIS — Z1389 Encounter for screening for other disorder: Secondary | ICD-10-CM

## 2017-06-18 DIAGNOSIS — R1030 Lower abdominal pain, unspecified: Secondary | ICD-10-CM

## 2017-06-18 DIAGNOSIS — R109 Unspecified abdominal pain: Secondary | ICD-10-CM

## 2017-06-18 LAB — POCT URINALYSIS DIPSTICK
Glucose, UA: NEGATIVE
Ketones, UA: NEGATIVE
LEUKOCYTES UA: NEGATIVE
NITRITE UA: NEGATIVE
PROTEIN UA: NEGATIVE

## 2017-06-18 NOTE — Progress Notes (Signed)
Walk-in work-in VISIT Patient name: Melanie Price MRN [id number]  Date of birth: 12/28/1990 Chief Complaint:   work in ob (severe abd pain; went to [facility] yesterday)  History of Present Illness:   Melanie Price is a 26 y.o. 663P1011 Caucasian female at 5685w2d being seen today for report of abdominal pain.  Was seen last night at [facility] for same. She had a normal exam, cx cl/th/high, no blood in vagina, +FHTs, neg UA and wet prep, gc/ct are still pending, CBC 15.3, Hgb 11.0, AST 49/ALT 29. NP discussed w/ JVF who wanted pt seen in office today @ 0830.    Pt describes pain as low mid abd, sharp when she leans over, then crampy in abd/back since Friday. Has occ taken apap, not sure if helps. Denies fever/chills, vomiting. Some occ nausea. Some occ brown nonodorous vaginal discharge x 1 month. Denies itching/irritation/odor. + fetal movement. Denies leakage of fluid or contractions or bright red vaginal bleeding. Denies urinary frequency, urgency, hesitancy, dysuria, hematuria. Normal daily bm's. Denies constipation/diarrhea. Has appt for anatomy u/s and ob visit in 1wk with Dr. Despina HiddenEure.  Patient's last menstrual period was 01/30/2017 (approximate). Review of Systems:   Pertinent items are noted in HPI Denies fever/chills, dizziness, headaches, visual disturbances, fatigue, shortness of breath, chest pain, vomiting, abnormal vaginal discharge/itching/odor/irritation, problems  bowel movements, urination, or intercourse unless otherwise stated above.  Pertinent History Reviewed:  Reviewed past medical,surgical, social, obstetrical and family history.  Reviewed problem list, medications and allergies. Physical Assessment:   Vitals:   06/18/17 0916  BP: (!) 92/56  Pulse: 77  Weight: 181 lb 8 oz (82.3 kg)  Body mass index is 27.6 kg/m.       Physical Examination:   General appearance: alert, well appearing, and in no distress  Mental status: alert, oriented to person, place, and time  Skin:  warm & dry   Cardiovascular: normal heart rate noted  Respiratory: normal respiratory effort, no distress  Abdomen: soft, non-tender   FHT via doppler: 142  Pelvic: pt declined, states she was still 'sore' from pelvic exam last night  Extremities: no edema   Results for orders placed or performed in visit on 06/18/17 (from the past 24 hour(s))  POCT urinalysis dipstick   Collection Time: 06/18/17  9:18 AM  Result Value Ref Range   Color, UA     Clarity, UA     Glucose, UA neg    Bilirubin, UA     Ketones, UA neg    Spec Grav, UA  1.010 - 1.025   Blood, UA trace    pH, UA  5.0 - 8.0   Protein, UA neg    Urobilinogen, UA  0.2 or 1.0 E.U./dL   Nitrite, UA neg    Leukocytes, UA Negative Negative  Results for orders placed or performed during the hospital encounter of 06/17/17 (from the past 24 hour(s))  Urinalysis, Routine w reflex microscopic   Collection Time: 06/17/17  9:12 PM  Result Value Ref Range   Color, Urine YELLOW YELLOW   APPearance CLEAR CLEAR   Specific Gravity, Urine 1.024 1.005 - 1.030   pH 6.0 5.0 - 8.0   Glucose, UA NEGATIVE NEGATIVE mg/dL   Hgb urine dipstick NEGATIVE NEGATIVE   Bilirubin Urine NEGATIVE NEGATIVE   Ketones, ur NEGATIVE NEGATIVE mg/dL   Protein, ur NEGATIVE NEGATIVE mg/dL   Nitrite NEGATIVE NEGATIVE   Leukocytes, UA NEGATIVE NEGATIVE  CBC with Differential/Platelet   Collection Time: 06/17/17  9:53 PM  Result Value Ref Range   WBC 15.3 (H) 4.0 - 10.5 K/uL   RBC 4.14 3.87 - 5.11 MIL/uL   Hemoglobin 11.0 (L) 12.0 - 15.0 g/dL   HCT 11.933.7 (L) 14.736.0 - 82.946.0 %   MCV 81.4 78.0 - 100.0 fL   MCH 26.6 26.0 - 34.0 pg   MCHC 32.6 30.0 - 36.0 g/dL   RDW 56.215.7 (H) 13.011.5 - 86.515.5 %   Platelets 375 150 - 400 K/uL   Neutrophils Relative % 76 %   Neutro Abs 11.6 (H) 1.7 - 7.7 K/uL   Lymphocytes Relative 16 %   Lymphs Abs 2.4 0.7 - 4.0 K/uL   Monocytes Relative 7 %   Monocytes Absolute 1.0 0.1 - 1.0 K/uL   Eosinophils Relative 1 %   Eosinophils Absolute  0.1 0.0 - 0.7 K/uL   Basophils Relative 0 %   Basophils Absolute 0.0 0.0 - 0.1 K/uL  Comprehensive metabolic panel   Collection Time: 06/17/17  9:53 PM  Result Value Ref Range   Sodium 133 (L) 135 - 145 mmol/L   Potassium 3.9 3.5 - 5.1 mmol/L   Chloride 103 101 - 111 mmol/L   CO2 24 22 - 32 mmol/L   Glucose, Bld 89 65 - 99 mg/dL   BUN 13 6 - 20 mg/dL   Creatinine, Ser 7.840.64 0.44 - 1.00 mg/dL   Calcium 8.6 (L) 8.9 - 10.3 mg/dL   Total Protein 7.1 6.5 - 8.1 g/dL   Albumin 3.2 (L) 3.5 - 5.0 g/dL   AST 49 (H) 15 - 41 U/L   ALT 29 14 - 54 U/L   Alkaline Phosphatase 152 (H) 38 - 126 U/L   Total Bilirubin 0.2 (L) 0.3 - 1.2 mg/dL   GFR calc non Af Amer >60 >60 mL/min   GFR calc Af Amer >60 >60 mL/min   Anion gap 6 5 - 15  Wet prep, genital   Collection Time: 06/17/17 10:25 PM  Result Value Ref Range   Yeast Wet Prep HPF POC NONE SEEN NONE SEEN   Trich, Wet Prep NONE SEEN NONE SEEN   Clue Cells Wet Prep HPF POC NONE SEEN NONE SEEN   WBC, Wet Prep HPF POC MANY (A) NONE SEEN   Sperm NONE SEEN     Assessment & Plan:  1) Low mid abd pain/cramping> exam neg last night in ED, wet prep neg, gc/ct still pending. Declines pelvic exam today. UA neg. Will send urine culture. Push po fluids, pelvi rest for now. Let us know/seek care if pain worsens or develops new sx  2) Slightly elevated AST> possibly from apap use, pain is not RUQ, discussed w/ LHE, no further work-up needed.  Orders Placed This Encounter  Procedures  . Urine Culture  . POCT urinalysis dipstick    Return for As scheduled 11/21 for LROB/anatomy.  Marge DuncansBooker, Dalilah Curlin Randall CNM, [facility]NP-BC 06/18/2017 0930

## 2017-06-19 LAB — GC/CHLAMYDIA PROBE AMP (~~LOC~~) NOT AT ARMC
Chlamydia: NEGATIVE
Neisseria Gonorrhea: NEGATIVE

## 2017-06-20 LAB — URINE CULTURE

## 2017-06-26 ENCOUNTER — Ambulatory Visit (INDEPENDENT_AMBULATORY_CARE_PROVIDER_SITE_OTHER): Payer: Medicaid Other | Admitting: Obstetrics & Gynecology

## 2017-06-26 ENCOUNTER — Encounter: Payer: Self-pay | Admitting: Obstetrics & Gynecology

## 2017-06-26 ENCOUNTER — Other Ambulatory Visit: Payer: Self-pay

## 2017-06-26 ENCOUNTER — Ambulatory Visit (INDEPENDENT_AMBULATORY_CARE_PROVIDER_SITE_OTHER): Payer: 59

## 2017-06-26 VITALS — BP 122/78 | HR 83 | Wt 182.0 lb

## 2017-06-26 DIAGNOSIS — Z3402 Encounter for supervision of normal first pregnancy, second trimester: Secondary | ICD-10-CM

## 2017-06-26 DIAGNOSIS — Z3482 Encounter for supervision of other normal pregnancy, second trimester: Secondary | ICD-10-CM

## 2017-06-26 DIAGNOSIS — Z3A19 19 weeks gestation of pregnancy: Secondary | ICD-10-CM | POA: Diagnosis not present

## 2017-06-26 DIAGNOSIS — Z363 Encounter for antenatal screening for malformations: Secondary | ICD-10-CM | POA: Diagnosis not present

## 2017-06-26 DIAGNOSIS — Z1389 Encounter for screening for other disorder: Secondary | ICD-10-CM

## 2017-06-26 DIAGNOSIS — Z331 Pregnant state, incidental: Secondary | ICD-10-CM

## 2017-06-26 NOTE — Progress Notes (Signed)
[id number] 5485w3d Estimated Date of Delivery: 11/17/17  Blood pressure 122/78, pulse 83, weight 182 lb (82.6 kg), last menstrual period 01/30/2017.   BP weight and urine results all reviewed and noted.  Please refer to the obstetrical flow sheet for the fundal height and fetal heart rate documentation:  Patient reports good fetal movement, denies any bleeding and no rupture of membranes symptoms or regular contractions. Patient is without complaints. All questions were answered.  Orders Placed This Encounter  Procedures  . POCT urinalysis dipstick    Plan:  Continued routine obstetrical care, sonogram is normal  Return in about 4 weeks (around 07/24/2017) for LROB.

## 2017-06-26 NOTE — Addendum Note (Signed)
Addended by: Moss McRESENZO, Lyana Asbill M on: 06/26/2017 12:30 PM   Modules accepted: Orders

## 2017-06-26 NOTE — Progress Notes (Signed)
US 19+3 wks,cephalic,ant pl gr 0,normal ovaries bilat,cx 3.6 cm,svp of fluid 4.7 cm,fhr 155 bpm,EFW 321 g,anatomy complete,no obvious abnormalities

## 2017-07-02 ENCOUNTER — Emergency Department (HOSPITAL_COMMUNITY)
Admission: EM | Admit: 2017-07-02 | Discharge: 2017-07-02 | Disposition: A | Discharge status: Home / Self Care | Payer: 59 | Attending: Emergency Medicine | Admitting: Emergency Medicine

## 2017-07-02 ENCOUNTER — Other Ambulatory Visit: Payer: Self-pay

## 2017-07-02 ENCOUNTER — Telehealth: Payer: Self-pay | Admitting: *Deleted

## 2017-07-02 ENCOUNTER — Encounter (HOSPITAL_COMMUNITY): Payer: Self-pay | Admitting: *Deleted

## 2017-07-02 DIAGNOSIS — Z3A20 20 weeks gestation of pregnancy: Secondary | ICD-10-CM | POA: Diagnosis not present

## 2017-07-02 DIAGNOSIS — Z79899 Other long term (current) drug therapy: Secondary | ICD-10-CM | POA: Insufficient documentation

## 2017-07-02 DIAGNOSIS — N898 Other specified noninflammatory disorders of vagina: Secondary | ICD-10-CM | POA: Diagnosis not present

## 2017-07-02 DIAGNOSIS — R109 Unspecified abdominal pain: Secondary | ICD-10-CM | POA: Diagnosis not present

## 2017-07-02 DIAGNOSIS — O26892 Other specified pregnancy related conditions, second trimester: Secondary | ICD-10-CM | POA: Insufficient documentation

## 2017-07-02 LAB — URINALYSIS, ROUTINE W REFLEX MICROSCOPIC
BACTERIA UA: NONE SEEN
BILIRUBIN URINE: NEGATIVE
Glucose, UA: NEGATIVE mg/dL
HGB URINE DIPSTICK: NEGATIVE
KETONES UR: NEGATIVE mg/dL
NITRITE: NEGATIVE
PH: 6 (ref 5.0–8.0)
PROTEIN: NEGATIVE mg/dL
SPECIFIC GRAVITY, URINE: 1.012 (ref 1.005–1.030)

## 2017-07-02 LAB — WET PREP, GENITAL
Sperm: NONE SEEN
Trich, Wet Prep: NONE SEEN
YEAST WET PREP: NONE SEEN

## 2017-07-02 MED ORDER — METRONIDAZOLE 500 MG PO TABS: 500.0000 mg | ORAL_TABLET | Freq: Two times a day (BID) | ORAL | 0 refills | Status: DC

## 2017-07-02 NOTE — ED Triage Notes (Signed)
Pt c/o lower abdominal pain with some red/brown vaginal drainage that started around 2230 this evening

## 2017-07-02 NOTE — Telephone Encounter (Signed)
Patient called stating she needed to be seen for cramping and spotting. She was seen in the ED this morning and was diagnosed with BV but refused medication at the time, requesting to be seen in our office. Informed patient that she needed medication to treat the BV in which our treatment would have been the same as the ED. Informed patient that BV does cause spotting and cramping but can also cause PTL. Encouraged patient to push fluids as well. Will get provider to send in medication. Verbalized understanding.

## 2017-07-02 NOTE — ED Provider Notes (Signed)
[facility] EMERGENCY DEPARTMENT Provider Note   CSN: [id number] Arrival date & time: 07/02/17  0109     History   Chief Complaint Chief Complaint  Patient presents with  . Abdominal Pain    HPI Melanie Price is a 26 y.o. female.  The history is provided by the patient.  Abdominal Pain   This is a new problem. The current episode started 1 to 2 hours ago. The problem occurs constantly. The problem has been resolved. The pain is located in the suprapubic region. The pain is mild. Associated symptoms include nausea. Pertinent negatives include fever, diarrhea, vomiting and dysuria. Nothing aggravates the symptoms. Nothing relieves the symptoms.   Patient is a G3P1011, 20 weeks and 2 days, who presents with the onset of lower abdominal pain as well as mild vaginal bleeding, as well as mild vaginal discharge. Her pain is improving She denies leaking of fluid No new back pain She denies any fever, vomiting She denies any known complications from this pregnancy  Denies any recent sexual activity tonight She denies any recent physical activity tonight  Past Medical History:  Diagnosis Date  . Anemia   . Bleeding in early pregnancy 12/19/2015  . Contraceptive management 09/26/2015  . Encounter for Nexplanon removal 09/26/2015  . Miscarriage 12/21/2015    Patient Active Problem List   Diagnosis Date Noted  . Supervision of normal pregnancy 04/16/2017    Past Surgical History:  Procedure Laterality Date  . NO PAST SURGERIES      OB History    Gravida Para Term Preterm AB Living   3 1 1   1 1    SAB TAB Ectopic Multiple Live Births   1     0 1       Home Medications    Prior to Admission medications   Medication Sig Start Date End Date Taking? Authorizing Provider  acetaminophen (TYLENOL) 500 MG tablet Take 1,000 mg by mouth every 6 (six) hours as needed for mild pain.    [provider]  omeprazole (PRILOSEC) 20 MG capsule Take 1 capsule (20 mg total) by  mouth 2 (two) times daily before a meal. 05/11/17   Lavera Guise, MD  Prenatal Vit-Fe Fumarate-FA (PRENATAL MULTIVITAMIN) TABS tablet Take 1 tablet by mouth daily at 12 noon.    [provider]    Family History Family History  Problem Relation Age of Onset  . Cancer Maternal Grandfather        skin  . Pyloric stenosis Son   . Irritable bowel syndrome Paternal Aunt   . Crohn's disease Paternal Aunt   . Heart disease Maternal Aunt   . Heart disease Maternal Uncle     Social History Social History   Tobacco Use  . Smoking status: Never Smoker  . Smokeless tobacco: Never Used  Substance Use Topics  . Alcohol use: No  . Drug use: No     Allergies   Patient has no known allergies.   Review of Systems Review of Systems  Constitutional: Negative for fever.  Gastrointestinal: Positive for abdominal pain and nausea. Negative for diarrhea and vomiting.  Genitourinary: Positive for vaginal bleeding and vaginal discharge. Negative for dysuria.  All other systems reviewed and are negative.    Physical Exam Updated Vital Signs BP 105/65 (BP Location: Left Arm)   Pulse 82   Temp 98.4 F (36.9 C) (Oral)   Resp 18   Ht 1.727 m (5\' 8" )   Wt 82.6 kg (  182 lb)   LMP 01/30/2017 (Approximate)   SpO2 100%   BMI 27.67 kg/m   Physical Exam  CONSTITUTIONAL: Well developed/well nourished HEAD: Normocephalic/atraumatic EYES: EOMI/PERRL ENMT: Mucous membranes moist NECK: supple no meningeal signs SPINE/BACK:entire spine nontender CV: S1/S2 noted, no murmurs/rubs/gallops noted LUNGS: Lungs are clear to auscultation bilaterally, no apparent distress ABDOMEN: soft, nontender, no rebound or guarding, bowel sounds noted throughout abdomen, gravid GU:no cva tenderness Pelvic exam chaperoned by nurse Claris Gowerharlotte Sterile speculum exam performed with sterile gloves Small amount of whitish discharge noted Small focus of blood noted cervical os No products of conception  noted NEURO: Pt is awake/alert/appropriate, moves all extremitiesx4.  No facial droop.   EXTREMITIES: pulses normal/equal, full ROM SKIN: warm, color normal PSYCH: no abnormalities of mood noted, alert and oriented to situation  ED Treatments / Results  Labs (all labs ordered are listed, but only abnormal results are displayed) Labs Reviewed  WET PREP, GENITAL - Abnormal; Notable for the following components:      Result Value   Clue Cells Wet Prep HPF POC PRESENT (*)    WBC, Wet Prep HPF POC MODERATE (*)    All other components within normal limits  URINALYSIS, ROUTINE W REFLEX MICROSCOPIC - Abnormal; Notable for the following components:   Leukocytes, UA TRACE (*)    Squamous Epithelial / LPF 0-5 (*)    All other components within normal limits  URINE CULTURE  GC/CHLAMYDIA PROBE AMP ([place]) NOT AT [facility]    EKG  EKG Interpretation None       Radiology No results found.  Procedures Procedures (including critical care time)  Medications Ordered in ED Medications - No data to display   Initial Impression / Assessment and Plan / ED Course  I have reviewed the triage vital signs and the nursing notes.  Pertinent labs  results that were available during my care of the patient were reviewed by me and considered in my medical decision making (see chart for details).     2:26 AM Patient well-appearing no distress no focal abdominal tenderness Fetal heart tones are noted to be in the 140s We will plan to check urinalysis as well as perform a pelvic examand discuss with OB/GYN 3:23 AM She reports all of her pain is improved She also reports that bleeding/discharge are all improved I discussed the case with the on-call [facility] We discussed physical exam findings fetal heart tones as well as recent ultrasound (showed anterior placenta) At this point patient is safe for discharge with close follow-up with OB/GYN as an  outpatient She has no signs of obstetric emergency at this time 3:44 AM Patient updated all lab findings I discussed need for follow-up with OB/GYN in the next 24-48 hours We discussed strict ER return precautions as well She gave me permission to discuss with her mother the findings and discussion with OB/GYN  Prior to Speaking in front of mother I informed patient of findings of BV Advised that it is recommended that she be treated for BV during pregnancy Prefers to wait and not start medications and follow-up with her OB/GYN in the next 24-48 hours I reassured patient that this is not an STD but will need to be treated nonetheless Final Clinical Impressions(s) / ED Diagnoses   Final diagnoses:  Abdominal pain in pregnancy, second trimester  Vaginal discharge during pregnancy in second trimester    ED Discharge Orders    None  Zadie RhineWickline, Randee Huston, MD 07/02/17 [phone]

## 2017-07-03 LAB — GC/CHLAMYDIA PROBE AMP (~~LOC~~) NOT AT ARMC
Chlamydia: NEGATIVE
Neisseria Gonorrhea: NEGATIVE

## 2017-07-03 LAB — URINE CULTURE

## 2017-07-08 ENCOUNTER — Inpatient Hospital Stay (HOSPITAL_COMMUNITY)
Admission: AD | Admit: 2017-07-08 | Discharge: 2017-07-08 | Disposition: A | Discharge status: Home / Self Care | Payer: 59 | Source: Ambulatory Visit | Attending: Obstetrics and Gynecology | Admitting: Obstetrics and Gynecology

## 2017-07-08 ENCOUNTER — Other Ambulatory Visit: Payer: Self-pay

## 2017-07-08 ENCOUNTER — Encounter (HOSPITAL_COMMUNITY): Payer: Self-pay | Admitting: *Deleted

## 2017-07-08 DIAGNOSIS — Z3A21 21 weeks gestation of pregnancy: Secondary | ICD-10-CM | POA: Insufficient documentation

## 2017-07-08 DIAGNOSIS — Z3402 Encounter for supervision of normal first pregnancy, second trimester: Secondary | ICD-10-CM

## 2017-07-08 DIAGNOSIS — O26892 Other specified pregnancy related conditions, second trimester: Secondary | ICD-10-CM | POA: Diagnosis present

## 2017-07-08 DIAGNOSIS — N898 Other specified noninflammatory disorders of vagina: Secondary | ICD-10-CM | POA: Insufficient documentation

## 2017-07-08 DIAGNOSIS — Z8619 Personal history of other infectious and parasitic diseases: Secondary | ICD-10-CM | POA: Insufficient documentation

## 2017-07-08 DIAGNOSIS — Z79899 Other long term (current) drug therapy: Secondary | ICD-10-CM | POA: Diagnosis not present

## 2017-07-08 DIAGNOSIS — O4692 Antepartum hemorrhage, unspecified, second trimester: Secondary | ICD-10-CM | POA: Diagnosis present

## 2017-07-08 DIAGNOSIS — N888 Other specified noninflammatory disorders of cervix uteri: Secondary | ICD-10-CM

## 2017-07-08 LAB — URINALYSIS, ROUTINE W REFLEX MICROSCOPIC
Bilirubin Urine: NEGATIVE
Glucose, UA: NEGATIVE mg/dL
Hgb urine dipstick: NEGATIVE
Ketones, ur: NEGATIVE mg/dL
LEUKOCYTES UA: NEGATIVE
NITRITE: NEGATIVE
PH: 5 (ref 5.0–8.0)
Protein, ur: NEGATIVE mg/dL
SPECIFIC GRAVITY, URINE: 1.028 (ref 1.005–1.030)

## 2017-07-08 LAB — WET PREP, GENITAL
CLUE CELLS WET PREP: NONE SEEN
SPERM: NONE SEEN
TRICH WET PREP: NONE SEEN
Yeast Wet Prep HPF POC: NONE SEEN

## 2017-07-08 NOTE — MAU Note (Signed)
Pt has bloody thick discharge since today. She was taking an antibiotic for a bacterial infection and was feeling better. She finished it 2 days ago, today she started to have cramping and the discharge again.

## 2017-07-08 NOTE — MAU Provider Note (Signed)
Chief Complaint: Vaginal Discharge and Vaginal Bleeding   First Provider Initiated Contact with Patient 07/08/17 2226      SUBJECTIVE HPI: Melanie Price is a 26 y.o. G3P1011 at 2510w1d by LMP who presents to maternity admissions reporting light vaginal bleeding and increased thin white vaginal discharge resumed today. She had similar symptoms on 07/02/17, when she was seen at [facility] ED and diagnosed with BV. She completed the PO Flagyl but symptoms resumed today, including the associated cramping.  She denies any other associated symptoms. She has not tried any other treatments.  She is feeling normal fetal movement.    HPI  Past Medical History:  Diagnosis Date  . Anemia   . Bleeding in early pregnancy 12/19/2015  . Contraceptive management 09/26/2015  . Encounter for Nexplanon removal 09/26/2015  . Miscarriage 12/21/2015   Past Surgical History:  Procedure Laterality Date  . NO PAST SURGERIES     Social History   Socioeconomic History  . Marital status: Married    Spouse name: Not on file  . Number of children: Not on file  . Years of education: Not on file  . Highest education level: Not on file  Social Needs  . Financial resource strain: Not on file  . Food insecurity - worry: Not on file  . Food insecurity - inability: Not on file  . Transportation needs - medical: Not on file  . Transportation needs - non-medical: Not on file  Occupational History  . Not on file  Tobacco Use  . Smoking status: Never Smoker  . Smokeless tobacco: Never Used  Substance and Sexual Activity  . Alcohol use: No  . Drug use: No  . Sexual activity: Yes    Birth control/protection: None  Other Topics Concern  . Not on file  Social History Narrative  . Not on file   No current facility-administered medications on file prior to encounter.    Current Outpatient Medications on File Prior to Encounter  Medication Sig Dispense Refill  . acetaminophen (TYLENOL) 500 MG tablet Take 1,000 mg  by mouth every 6 (six) hours as needed for mild pain.    Marland Kitchen. omeprazole (PRILOSEC) 20 MG capsule Take 1 capsule (20 mg total) by mouth 2 (two) times daily before a meal. 60 capsule 0  . Prenatal Vit-Fe Fumarate-FA (PRENATAL MULTIVITAMIN) TABS tablet Take 1 tablet by mouth daily at 12 noon.     No Known Allergies  ROS:  Review of Systems  Constitutional: Negative for chills, fatigue and fever.  Respiratory: Negative for shortness of breath.   Cardiovascular: Negative for chest pain.  Gastrointestinal: Negative for abdominal pain, nausea and vomiting.  Genitourinary: Positive for pelvic pain, vaginal bleeding and vaginal discharge. Negative for difficulty urinating, dysuria, flank pain and vaginal pain.  Neurological: Negative for dizziness and headaches.  Psychiatric/Behavioral: Negative.      I have reviewed patient's Past Medical Hx, Surgical Hx, Family Hx, Social Hx, medications and allergies.   Physical Exam   Patient Vitals for the past 24 hrs:  BP Temp Temp src Pulse Resp Height Weight  07/08/17 2351 (!) 101/48 - - 84 - - -  07/08/17 2110 (!) 114/58 - - 92 - - -  07/08/17 2108 - 97.7 F (36.5 C) Oral - 16 5\' 8"  (1.727 m) 183 lb (83 kg)   Constitutional: Well-developed, well-nourished female in no acute distress.  Cardiovascular: normal rate Respiratory: normal effort GI: Abd soft, non-tender. Pos BS x 4 MS: Extremities nontender, no  edema, normal ROM Neurologic: Alert and oriented x 4.  GU: Neg CVAT.  PELVIC EXAM: Cervix visually closed with ectropion visible, friable to cotton swab, no active bleeding noted, no blood in vault, scant white creamy discharge, vaginal walls and external genitalia normal  Dilation: Closed Effacement (%): Thick Cervical Position: Posterior Exam by:: Leftwich-Kirby, CNM  FHT 154 by doppler  LAB RESULTS Results for orders placed or performed during the hospital encounter of 07/08/17 (from the past 24 hour(s))  Urinalysis, Routine w reflex  microscopic     Status: None   Collection Time: 07/08/17  9:16 PM  Result Value Ref Range   Color, Urine YELLOW YELLOW   APPearance CLEAR CLEAR   Specific Gravity, Urine 1.028 1.005 - 1.030   pH 5.0 5.0 - 8.0   Glucose, UA NEGATIVE NEGATIVE mg/dL   Hgb urine dipstick NEGATIVE NEGATIVE   Bilirubin Urine NEGATIVE NEGATIVE   Ketones, ur NEGATIVE NEGATIVE mg/dL   Protein, ur NEGATIVE NEGATIVE mg/dL   Nitrite NEGATIVE NEGATIVE   Leukocytes, UA NEGATIVE NEGATIVE  Wet prep, genital     Status: Abnormal   Collection Time: 07/08/17 10:30 PM  Result Value Ref Range   Yeast Wet Prep HPF POC NONE SEEN NONE SEEN   Trich, Wet Prep NONE SEEN NONE SEEN   Clue Cells Wet Prep HPF POC NONE SEEN NONE SEEN   WBC, Wet Prep HPF POC FEW (A) NONE SEEN   Sperm NONE SEEN     --/--/O POS (08/02 1233)  IMAGING   MAU Management/MDM: Pt with friable cervix on exam today but closed/thick/high, firm, no evidence of preterm labor or cervical shortening.  Wet prep negative Bleeding likely from friable cervix, may improve now that BV is not present Recommend pelvic rest for now, no vigorous exercise [facility], sooner if bleeding persists Return to MAU as needed for emergencies Pt discharged with strict bleeding/preterm labor precautions.  ASSESSMENT 1. Vaginal bleeding in pregnancy, second trimester   2. Encounter for supervision of normal first pregnancy in second trimester   3. Friable cervix     PLAN Discharge home Allergies as of 07/08/2017   No Known Allergies     Medication List    STOP taking these medications   metroNIDAZOLE 500 MG tablet Commonly known as:  FLAGYL     TAKE these medications   acetaminophen 500 MG tablet Commonly known as:  TYLENOL Take 1,000 mg by mouth every 6 (six) hours as needed for mild pain.   omeprazole 20 MG capsule Commonly known as:  PRILOSEC Take 1 capsule (20 mg total) by mouth 2 (two) times daily before a meal.   prenatal  multivitamin Tabs tablet Take 1 tablet by mouth daily at 12 noon.      Follow-up Information    [facility] Follow up.   Why:  As scheduled, sooner if bleeding persists. Return to MAU as needed for heavy bleeding or emergencies. Contact information: [address] [place] [phone]          Sharen CounterLisa Leftwich-Kirby Certified Nurse-Midwife 07/09/2017  1:29 AM

## 2017-07-09 LAB — GC/CHLAMYDIA PROBE AMP (~~LOC~~) NOT AT ARMC
Chlamydia: NEGATIVE
Neisseria Gonorrhea: NEGATIVE

## 2017-07-24 ENCOUNTER — Encounter: Payer: Self-pay | Admitting: Advanced Practice Midwife

## 2017-07-24 ENCOUNTER — Ambulatory Visit (INDEPENDENT_AMBULATORY_CARE_PROVIDER_SITE_OTHER): Payer: 59 | Admitting: Advanced Practice Midwife

## 2017-07-24 VITALS — BP 100/70 | HR 96 | Wt 187.0 lb

## 2017-07-24 DIAGNOSIS — Z1389 Encounter for screening for other disorder: Secondary | ICD-10-CM

## 2017-07-24 DIAGNOSIS — Z3482 Encounter for supervision of other normal pregnancy, second trimester: Secondary | ICD-10-CM

## 2017-07-24 DIAGNOSIS — Z331 Pregnant state, incidental: Secondary | ICD-10-CM

## 2017-07-24 DIAGNOSIS — Z3A23 23 weeks gestation of pregnancy: Secondary | ICD-10-CM

## 2017-07-24 LAB — POCT URINALYSIS DIPSTICK
GLUCOSE UA: NEGATIVE
KETONES UA: NEGATIVE
Leukocytes, UA: NEGATIVE
Nitrite, UA: NEGATIVE
Protein, UA: NEGATIVE
RBC UA: NEGATIVE

## 2017-07-24 NOTE — Patient Instructions (Signed)

## 2017-07-24 NOTE — Addendum Note (Signed)
Addended by: Jacklyn ShellRESENZO-DISHMON, Javius Sylla on: 07/24/2017 12:57 PM   Modules accepted: Orders

## 2017-07-24 NOTE — Progress Notes (Signed)
[id number] 4654w3d Estimated Date of Delivery: 11/17/17  Blood pressure 100/70, pulse 96, weight 187 lb (84.8 kg), last menstrual period 01/30/2017.   BP weight and urine results all reviewed and noted.  Please refer to the obstetrical flow sheet for the fundal height and fetal heart rate documentation:  Patient reports good fetal movement, denies any bleeding and no rupture of membranes symptoms or regular contractions. Patient is without complaints. All questions were answered.  Orders Placed This Encounter  Procedures  . POCT urinalysis dipstick    Plan:  Continued routine obstetrical care,   Return in about 4 weeks (around 08/21/2017) for PN2/LROB.

## 2017-08-06 NOTE — L&D Delivery Note (Addendum)
Delivery Note At 7:09 AM a viable and healthy female was delivered via Vaginal, Spontaneous (Presentation: ROA ).  APGAR:9 , 9; weight pending.   Placenta status:spontaneous ,intact .  Cord: 3 vessel with the following complications: .    Anesthesia:  epidural Episiotomy:  None Lacerations:  None Suture Repair: n/a Est. Blood Loss (mL):  100   Mom to postpartum.  Baby to Couplet care / Skin to Skin.  Melanie Price is a 27 y.o. female 793P1011 with IUP at 1774w6d admitted for IOL for preeclampsia with severe features . FHR tracing with intermittent decelerations, mostly early decels with some intermittent late decels.  At 0628, pt was 7 cm and AROM and IUPC placed .  Pt became more uncomfortable and felt rectal pressure and was found to be 10 cm dilated before CNM left the room..  Pushing began and delivery within 30 minutes.  NICU team present for delivery due to preterm gestation.  Cord clamped at 1 minute and baby taken to warmer for precautions.  Infant transitioned well and brought back to pt within a few minutes.   Placenta intact and spontaneous, bleeding minimal.  Intact perineum.  Mom and baby stable prior to transfer to postpartum. She plans on formula feeding. She requests IUD for birth control.  [name] 10/19/2017, 7:20 AM

## 2017-08-21 ENCOUNTER — Other Ambulatory Visit: Payer: 59

## 2017-08-21 ENCOUNTER — Ambulatory Visit (INDEPENDENT_AMBULATORY_CARE_PROVIDER_SITE_OTHER): Payer: 59 | Admitting: Advanced Practice Midwife

## 2017-08-21 VITALS — BP 130/66 | HR 84 | Wt 193.0 lb

## 2017-08-21 DIAGNOSIS — Z3A27 27 weeks gestation of pregnancy: Secondary | ICD-10-CM

## 2017-08-21 DIAGNOSIS — Z1389 Encounter for screening for other disorder: Secondary | ICD-10-CM

## 2017-08-21 DIAGNOSIS — Z3482 Encounter for supervision of other normal pregnancy, second trimester: Secondary | ICD-10-CM

## 2017-08-21 DIAGNOSIS — Z331 Pregnant state, incidental: Secondary | ICD-10-CM

## 2017-08-21 DIAGNOSIS — Z131 Encounter for screening for diabetes mellitus: Secondary | ICD-10-CM

## 2017-08-21 LAB — POCT URINALYSIS DIPSTICK
Blood, UA: NEGATIVE
GLUCOSE UA: NEGATIVE
KETONES UA: NEGATIVE
LEUKOCYTES UA: NEGATIVE
NITRITE UA: NEGATIVE
PROTEIN UA: NEGATIVE

## 2017-08-21 NOTE — Patient Instructions (Signed)
Melanie Price, I greatly value your feedback.  If you receive a survey following your visit with us today, we appreciate you taking the time to fill it out.  Thanks, [name], CNM   Call the office ([phone]) or go to [facility] if:  You begin to have strong, frequent contractions  Your water breaks.  Sometimes it is a big gush of fluid, sometimes it is just a trickle that keeps getting your panties wet or running down your legs  You have vaginal bleeding.  It is normal to have a small amount of spotting if your cervix was checked.   You don't feel your baby moving like normal.  If you don't, get you something to eat and drink and lay down and focus on feeling your baby move.  You should feel at least 10 movements in 2 hours.  If you don't, you should call the office or go to [facility].    Tdap Vaccine  It is recommended that you get the Tdap vaccine during the third trimester of EACH pregnancy to help protect your baby from getting pertussis (whooping cough)  27-36 weeks is the BEST time to do this so that you can pass the protection on to your baby. During pregnancy is better than after pregnancy, but if you are unable to get it during pregnancy it will be offered at the hospital.   You can get this vaccine at the health department or your family doctor  Everyone who will be around your baby should also be up-to-date on their vaccines. Adults (who are not pregnant) only need 1 dose of Tdap during adulthood.   Third Trimester of Pregnancy The third trimester is from week 29 through week 42, months 7 through 9. The third trimester is a time when the fetus is growing rapidly. At the end of the ninth month, the fetus is about 20 inches in length and weighs 6-10 pounds.  BODY CHANGES Your body goes through many changes during pregnancy. The changes vary from woman to woman.   Your weight will continue to increase. You can expect to gain 25-35 pounds (11-16 kg) by  the end of the pregnancy.  You may begin to get stretch marks on your hips, abdomen, and breasts.  You may urinate more often because the fetus is moving lower into your pelvis and pressing on your bladder.  You may develop or continue to have heartburn as a result of your pregnancy.  You may develop constipation because certain hormones are causing the muscles that push waste through your intestines to slow down.  You may develop hemorrhoids or swollen, bulging veins (varicose veins).  You may have pelvic pain because of the weight gain and pregnancy hormones relaxing your joints between the bones in your pelvis. Backaches may result from overexertion of the muscles supporting your posture.  You may have changes in your hair. These can include thickening of your hair, rapid growth, and changes in texture. Some women also have hair loss during or after pregnancy, or hair that feels dry or thin. Your hair will most likely return to normal after your baby is born.  Your breasts will continue to grow and be tender. A yellow discharge may leak from your breasts called colostrum.  Your belly button may stick out.  You may feel short of breath because of your expanding uterus.  You may notice the fetus "dropping," or moving lower in your abdomen.  You may have a bloody mucus   discharge. This usually occurs a few days to a week before labor begins.  Your cervix becomes thin and soft (effaced) near your due date. WHAT TO EXPECT AT YOUR PRENATAL EXAMS  You will have prenatal exams every 2 weeks until week 36. Then, you will have weekly prenatal exams. During a routine prenatal visit:  You will be weighed to make sure you and the fetus are growing normally.  Your blood pressure is taken.  Your abdomen will be measured to track your baby's growth.  The fetal heartbeat will be listened to.  Any test results from the previous visit will be discussed.  You may have a cervical check near your  due date to see if you have effaced. At around 36 weeks, your caregiver will check your cervix. At the same time, your caregiver will also perform a test on the secretions of the vaginal tissue. This test is to determine if a type of bacteria, Group B streptococcus, is present. Your caregiver will explain this further. Your caregiver may ask you:  What your birth plan is.  How you are feeling.  If you are feeling the baby move.  If you have had any abnormal symptoms, such as leaking fluid, bleeding, severe headaches, or abdominal cramping.  If you have any questions. Other tests or screenings that may be performed during your third trimester include:  Blood tests that check for low iron levels (anemia).  Fetal testing to check the health, activity level, and growth of the fetus. Testing is done if you have certain medical conditions or if there are problems during the pregnancy. FALSE LABOR You may feel small, irregular contractions that eventually go away. These are called Braxton Hicks contractions, or false labor. Contractions may last for hours, days, or even weeks before true labor sets in. If contractions come at regular intervals, intensify, or become painful, it is best to be seen by your caregiver.  SIGNS OF LABOR   Menstrual-like cramps.  Contractions that are 5 minutes apart or less.  Contractions that start on the top of the uterus and spread down to the lower abdomen and back.  A sense of increased pelvic pressure or back pain.  A watery or bloody mucus discharge that comes from the vagina. If you have any of these signs before the 37th week of pregnancy, call your caregiver right away. You need to go to the hospital to get checked immediately. HOME CARE INSTRUCTIONS   Avoid all smoking, herbs, alcohol, and unprescribed drugs. These chemicals affect the formation and growth of the baby.  Follow your caregiver's instructions regarding medicine use. There are medicines  that are either safe or unsafe to take during pregnancy.  Exercise only as directed by your caregiver. Experiencing uterine cramps is a good sign to stop exercising.  Continue to eat regular, healthy meals.  Wear a good support bra for breast tenderness.  Do not use hot tubs, steam rooms, or saunas.  Wear your seat belt at all times when driving.  Avoid raw meat, uncooked cheese, cat litter boxes, and soil used by cats. These carry germs that can cause birth defects in the baby.  Take your prenatal vitamins.  Try taking a stool softener (if your caregiver approves) if you develop constipation. Eat more high-fiber foods, such as fresh vegetables or fruit and whole grains. Drink plenty of fluids to keep your urine clear or pale yellow.  Take warm sitz baths to soothe any pain or discomfort caused by hemorrhoids. Use  hemorrhoid cream if your caregiver approves.  If you develop varicose veins, wear support hose. Elevate your feet for 15 minutes, 3-4 times a day. Limit salt in your diet.  Avoid heavy lifting, wear low heal shoes, and practice good posture.  Rest a lot with your legs elevated if you have leg cramps or low back pain.  Visit your dentist if you have not gone during your pregnancy. Use a soft toothbrush to brush your teeth and be gentle when you floss.  A sexual relationship may be continued unless your caregiver directs you otherwise.  Do not travel far distances unless it is absolutely necessary and only with the approval of your caregiver.  Take prenatal classes to understand, practice, and ask questions about the labor and delivery.  Make a trial run to the hospital.  Pack your hospital bag.  Prepare the baby's nursery.  Continue to go to all your prenatal visits as directed by your caregiver. SEEK MEDICAL CARE IF:  You are unsure if you are in labor or if your water has broken.  You have dizziness.  You have mild pelvic cramps, pelvic pressure, or nagging  pain in your abdominal area.  You have persistent nausea, vomiting, or diarrhea.  You have a bad smelling vaginal discharge.  You have pain with urination. SEEK IMMEDIATE MEDICAL CARE IF:   You have a fever.  You are leaking fluid from your vagina.  You have spotting or bleeding from your vagina.  You have severe abdominal cramping or pain.  You have rapid weight loss or gain.  You have shortness of breath with chest pain.  You notice sudden or extreme swelling of your face, hands, ankles, feet, or legs.  You have not felt your baby move in over an hour.  You have severe headaches that do not go away with medicine.  You have vision changes. Document Released: 07/17/2001 Document Revised: 07/28/2013 Document Reviewed: 09/23/2012 [facility] Patient Information 2015 [place], [place]. This information is not intended to replace advice given to you by your health care provider. Make sure you discuss any questions you have with your health care provider.

## 2017-08-21 NOTE — Progress Notes (Signed)
[id number] 965w3d Estimated Date of Delivery: 11/17/17  Blood pressure 130/66, pulse 84, weight 193 lb (87.5 kg), last menstrual period 01/30/2017.   BP weight and urine results all reviewed and noted.  Please refer to the obstetrical flow sheet for the fundal height and fetal heart rate documentation:  Patient reports good fetal movement, denies any bleeding and no rupture of membranes symptoms or regular contractions. Patient is without complaints. All questions were answered.  Orders Placed This Encounter  Procedures  . POCT Urinalysis Dipstick    Plan:  Continued routine obstetrical care, PN2 today   Return in about 3 weeks (around 09/11/2017) for LROB.

## 2017-08-22 LAB — ANTIBODY SCREEN: Antibody Screen: NEGATIVE

## 2017-08-22 LAB — CBC
HEMATOCRIT: 32.9 % — AB (ref 34.0–46.6)
Hemoglobin: 10.5 g/dL — ABNORMAL LOW (ref 11.1–15.9)
MCH: 26.1 pg — AB (ref 26.6–33.0)
MCHC: 31.9 g/dL (ref 31.5–35.7)
MCV: 82 fL (ref 79–97)
Platelets: 384 10*3/uL — ABNORMAL HIGH (ref 150–379)
RBC: 4.02 x10E6/uL (ref 3.77–5.28)
RDW: 15.3 % (ref 12.3–15.4)
WBC: 13.9 10*3/uL — ABNORMAL HIGH (ref 3.4–10.8)

## 2017-08-22 LAB — RPR: RPR: NONREACTIVE

## 2017-08-22 LAB — HIV ANTIBODY (ROUTINE TESTING W REFLEX): HIV SCREEN 4TH GENERATION: NONREACTIVE

## 2017-08-22 LAB — GLUCOSE TOLERANCE, 2 HOURS W/ 1HR
Glucose, 1 hour: 101 mg/dL (ref 65–179)
Glucose, 2 hour: 101 mg/dL (ref 65–152)
Glucose, Fasting: 80 mg/dL (ref 65–91)

## 2017-09-11 ENCOUNTER — Other Ambulatory Visit: Payer: Self-pay

## 2017-09-11 ENCOUNTER — Encounter: Payer: Self-pay | Admitting: Advanced Practice Midwife

## 2017-09-11 ENCOUNTER — Ambulatory Visit (INDEPENDENT_AMBULATORY_CARE_PROVIDER_SITE_OTHER): Payer: 59 | Admitting: Advanced Practice Midwife

## 2017-09-11 VITALS — BP 112/74 | HR 79 | Wt 200.0 lb

## 2017-09-11 DIAGNOSIS — Z3A30 30 weeks gestation of pregnancy: Secondary | ICD-10-CM

## 2017-09-11 DIAGNOSIS — Z1389 Encounter for screening for other disorder: Secondary | ICD-10-CM

## 2017-09-11 DIAGNOSIS — Z331 Pregnant state, incidental: Secondary | ICD-10-CM

## 2017-09-11 DIAGNOSIS — Z3483 Encounter for supervision of other normal pregnancy, third trimester: Secondary | ICD-10-CM

## 2017-09-11 LAB — POCT URINALYSIS DIPSTICK
Glucose, UA: NEGATIVE
KETONES UA: NEGATIVE
NITRITE UA: NEGATIVE
PROTEIN UA: NEGATIVE
RBC UA: NEGATIVE

## 2017-09-11 NOTE — Patient Instructions (Signed)
Mickeal SkinnerKayla C Darling, I greatly value your feedback.  If you receive a survey following your visit with us today, we appreciate you taking the time to fill it out.  Thanks, [name], CNM   Call the office [phone]) or go to [facility] if:  You begin to have strong, frequent contractions  Your water breaks.  Sometimes it is a big gush of fluid, sometimes it is just a trickle that keeps getting your panties wet or running down your legs  You have vaginal bleeding.  It is normal to have a small amount of spotting if your cervix was checked.   You don't feel your baby moving like normal.  If you don't, get you something to eat and drink and lay down and focus on feeling your baby move.  You should feel at least 10 movements in 2 hours.  If you don't, you should call the office or go to [facility].    Tdap Vaccine  It is recommended that you get the Tdap vaccine during the third trimester of EACH pregnancy to help protect your baby from getting pertussis (whooping cough)  27-36 weeks is the BEST time to do this so that you can pass the protection on to your baby. During pregnancy is better than after pregnancy, but if you are unable to get it during pregnancy it will be offered at the hospital.   You can get this vaccine at the health department or your family doctor  Everyone who will be around your baby should also be up-to-date on their vaccines. Adults (who are not pregnant) only need 1 dose of Tdap during adulthood.   Third Trimester of Pregnancy The third trimester is from week 29 through week 42, months 7 through 9. The third trimester is a time when the fetus is growing rapidly. At the end of the ninth month, the fetus is about 20 inches in length and weighs 6-10 pounds.  BODY CHANGES Your body goes through many changes during pregnancy. The changes vary from woman to woman.   Your weight will continue to increase. You can expect to gain 25-35 pounds (11-16 kg) by  the end of the pregnancy.  You may begin to get stretch marks on your hips, abdomen, and breasts.  You may urinate more often because the fetus is moving lower into your pelvis and pressing on your bladder.  You may develop or continue to have heartburn as a result of your pregnancy.  You may develop constipation because certain hormones are causing the muscles that push waste through your intestines to slow down.  You may develop hemorrhoids or swollen, bulging veins (varicose veins).  You may have pelvic pain because of the weight gain and pregnancy hormones relaxing your joints between the bones in your pelvis. Backaches may result from overexertion of the muscles supporting your posture.  You may have changes in your hair. These can include thickening of your hair, rapid growth, and changes in texture. Some women also have hair loss during or after pregnancy, or hair that feels dry or thin. Your hair will most likely return to normal after your baby is born.  Your breasts will continue to grow and be tender. A yellow discharge may leak from your breasts called colostrum.  Your belly button may stick out.  You may feel short of breath because of your expanding uterus.  You may notice the fetus "dropping," or moving lower in your abdomen.  You may have a bloody mucus  discharge. This usually occurs a few days to a week before labor begins.  Your cervix becomes thin and soft (effaced) near your due date. WHAT TO EXPECT AT YOUR PRENATAL EXAMS  You will have prenatal exams every 2 weeks until week 36. Then, you will have weekly prenatal exams. During a routine prenatal visit:  You will be weighed to make sure you and the fetus are growing normally.  Your blood pressure is taken.  Your abdomen will be measured to track your baby's growth.  The fetal heartbeat will be listened to.  Any test results from the previous visit will be discussed.  You may have a cervical check near your  due date to see if you have effaced. At around 36 weeks, your caregiver will check your cervix. At the same time, your caregiver will also perform a test on the secretions of the vaginal tissue. This test is to determine if a type of bacteria, Group B streptococcus, is present. Your caregiver will explain this further. Your caregiver may ask you:  What your birth plan is.  How you are feeling.  If you are feeling the baby move.  If you have had any abnormal symptoms, such as leaking fluid, bleeding, severe headaches, or abdominal cramping.  If you have any questions. Other tests or screenings that may be performed during your third trimester include:  Blood tests that check for low iron levels (anemia).  Fetal testing to check the health, activity level, and growth of the fetus. Testing is done if you have certain medical conditions or if there are problems during the pregnancy. FALSE LABOR You may feel small, irregular contractions that eventually go away. These are called Braxton Hicks contractions, or false labor. Contractions may last for hours, days, or even weeks before true labor sets in. If contractions come at regular intervals, intensify, or become painful, it is best to be seen by your caregiver.  SIGNS OF LABOR   Menstrual-like cramps.  Contractions that are 5 minutes apart or less.  Contractions that start on the top of the uterus and spread down to the lower abdomen and back.  A sense of increased pelvic pressure or back pain.  A watery or bloody mucus discharge that comes from the vagina. If you have any of these signs before the 37th week of pregnancy, call your caregiver right away. You need to go to the hospital to get checked immediately. HOME CARE INSTRUCTIONS   Avoid all smoking, herbs, alcohol, and unprescribed drugs. These chemicals affect the formation and growth of the baby.  Follow your caregiver's instructions regarding medicine use. There are medicines  that are either safe or unsafe to take during pregnancy.  Exercise only as directed by your caregiver. Experiencing uterine cramps is a good sign to stop exercising.  Continue to eat regular, healthy meals.  Wear a good support bra for breast tenderness.  Do not use hot tubs, steam rooms, or saunas.  Wear your seat belt at all times when driving.  Avoid raw meat, uncooked cheese, cat litter boxes, and soil used by cats. These carry germs that can cause birth defects in the baby.  Take your prenatal vitamins.  Try taking a stool softener (if your caregiver approves) if you develop constipation. Eat more high-fiber foods, such as fresh vegetables or fruit and whole grains. Drink plenty of fluids to keep your urine clear or pale yellow.  Take warm sitz baths to soothe any pain or discomfort caused by hemorrhoids. Use  hemorrhoid cream if your caregiver approves.  If you develop varicose veins, wear support hose. Elevate your feet for 15 minutes, 3-4 times a day. Limit salt in your diet.  Avoid heavy lifting, wear low heal shoes, and practice good posture.  Rest a lot with your legs elevated if you have leg cramps or low back pain.  Visit your dentist if you have not gone during your pregnancy. Use a soft toothbrush to brush your teeth and be gentle when you floss.  A sexual relationship may be continued unless your caregiver directs you otherwise.  Do not travel far distances unless it is absolutely necessary and only with the approval of your caregiver.  Take prenatal classes to understand, practice, and ask questions about the labor and delivery.  Make a trial run to the hospital.  Pack your hospital bag.  Prepare the baby's nursery.  Continue to go to all your prenatal visits as directed by your caregiver. SEEK MEDICAL CARE IF:  You are unsure if you are in labor or if your water has broken.  You have dizziness.  You have mild pelvic cramps, pelvic pressure, or nagging  pain in your abdominal area.  You have persistent nausea, vomiting, or diarrhea.  You have a bad smelling vaginal discharge.  You have pain with urination. SEEK IMMEDIATE MEDICAL CARE IF:   You have a fever.  You are leaking fluid from your vagina.  You have spotting or bleeding from your vagina.  You have severe abdominal cramping or pain.  You have rapid weight loss or gain.  You have shortness of breath with chest pain.  You notice sudden or extreme swelling of your face, hands, ankles, feet, or legs.  You have not felt your baby move in over an hour.  You have severe headaches that do not go away with medicine.  You have vision changes. Document Released: 07/17/2001 Document Revised: 07/28/2013 Document Reviewed: 09/23/2012 [facility] Patient Information 2015 [place], [place]. This information is not intended to replace advice given to you by your health care provider. Make sure you discuss any questions you have with your health care provider.

## 2017-09-11 NOTE — Progress Notes (Signed)
[id number] Estimated Date of Delivery: 11/17/17  Blood pressure 112/74, pulse 79, weight 200 lb (90.7 kg), last menstrual period 01/30/2017.   BP weight and urine results all reviewed and noted.  Please refer to the obstetrical flow sheet for the fundal height and fetal heart rate documentation:  Patient reports good fetal movement, denies any bleeding and no rupture of membranes symptoms or regular contractions. Patient is without complaints. All questions were answered.  Orders Placed This Encounter  Procedures  . POCT urinalysis dipstick    Plan:  Continued routine obstetrical care,   Return in about 2 weeks (around 09/25/2017) for LROB.

## 2017-09-18 ENCOUNTER — Other Ambulatory Visit: Payer: Self-pay

## 2017-09-18 ENCOUNTER — Encounter: Payer: Self-pay | Admitting: Obstetrics and Gynecology

## 2017-09-18 ENCOUNTER — Ambulatory Visit (INDEPENDENT_AMBULATORY_CARE_PROVIDER_SITE_OTHER): Payer: 59 | Admitting: Obstetrics and Gynecology

## 2017-09-18 VITALS — BP 118/72 | HR 87 | Wt 201.0 lb

## 2017-09-18 DIAGNOSIS — R109 Unspecified abdominal pain: Secondary | ICD-10-CM

## 2017-09-18 DIAGNOSIS — O9989 Other specified diseases and conditions complicating pregnancy, childbirth and the puerperium: Secondary | ICD-10-CM

## 2017-09-18 DIAGNOSIS — Z3A31 31 weeks gestation of pregnancy: Secondary | ICD-10-CM | POA: Diagnosis not present

## 2017-09-18 DIAGNOSIS — Z3483 Encounter for supervision of other normal pregnancy, third trimester: Secondary | ICD-10-CM

## 2017-09-18 DIAGNOSIS — Z331 Pregnant state, incidental: Secondary | ICD-10-CM

## 2017-09-18 DIAGNOSIS — O4693 Antepartum hemorrhage, unspecified, third trimester: Secondary | ICD-10-CM

## 2017-09-18 DIAGNOSIS — R102 Pelvic and perineal pain: Secondary | ICD-10-CM

## 2017-09-18 DIAGNOSIS — Z1389 Encounter for screening for other disorder: Secondary | ICD-10-CM

## 2017-09-18 DIAGNOSIS — Z3403 Encounter for supervision of normal first pregnancy, third trimester: Secondary | ICD-10-CM

## 2017-09-18 LAB — POCT URINALYSIS DIPSTICK
Blood, UA: NEGATIVE
Glucose, UA: NEGATIVE
Ketones, UA: NEGATIVE
Nitrite, UA: NEGATIVE
Protein, UA: NEGATIVE

## 2017-09-18 NOTE — Progress Notes (Signed)
LOW-RISK PREGNANCY VISIT Patient name: Melanie Price MRN [id number]  Date of birth: May 31, 1991 Chief Complaint:   vaginal bleeding (pelvic pressure)  History of Present Illness:   Melanie Price is a 27 y.o. G81P1011 female at [redacted]w[redacted]d with an Estimated Date of Delivery: 11/17/17 being seen today for ongoing management of a low-risk pregnancy.  Today she reports cramping, pelvic pressure and vaginal bleeding.  She has single bit of light spotting no clots last evening x1 none today  The spotting began last night. She does not believe anything caused it. Contractions: Not present. Vag. Bleeding: Scant.  Movement: Present. reports leaking of fluid. Review of Systems:   Pertinent items are noted in HPI Denies abnormal vaginal discharge w/ itching/odor/irritation, headaches, visual changes, shortness of breath, chest pain, abdominal pain, severe nausea/vomiting, or problems with urination or bowel movements unless otherwise stated above. Pertinent History Reviewed:  Reviewed past medical,surgical, social, obstetrical and family history.  Reviewed problem list, medications and allergies. Physical Assessment:   Vitals:   09/18/17 1128  BP: 118/72  Pulse: 87  Weight: 201 lb (91.2 kg)  Body mass index is 30.56 kg/m.        Physical Examination:   General appearance: Well appearing, and in no distress  Mental status: Alert, oriented to person, place, and time  Skin: Warm & dry  Cardiovascular: Normal heart rate noted  Respiratory: Normal respiratory effort, no distress  Abdomen: Soft, gravid, nontender  Pelvic: Cervical exam performed , no blood present in vagina, 1 cm, posterior.  The cervix feels on digital exam like a normal multiparous cervix        Extremities: Edema: None  Fetal Status: Fetal Heart Rate (bpm): 145 Fundal Height: 32 cm Movement: Present    Results for orders placed or performed in visit on 09/18/17 (from the past 24 hour(s))  POCT urinalysis dipstick   Collection  Time: 09/18/17 11:28 AM  Result Value Ref Range   Color, UA     Clarity, UA     Glucose, UA neg    Bilirubin, UA     Ketones, UA neg    Spec Grav, UA  1.010 - 1.025   Blood, UA neg    pH, UA  5.0 - 8.0   Protein, UA neg    Urobilinogen, UA  0.2 or 1.0 E.U./dL   Nitrite, UA neg    Leukocytes, UA Trace (A) Negative   Appearance     Odor      Assessment & Plan:  1) Low-risk pregnancy G3P1011 at [redacted]w[redacted]d with an Estimated Date of Delivery: 11/17/17    Meds: No orders of the defined types were placed in this encounter.  Labs/procedures today: Doppler, NST - reactive, fetal fibronectin collection done incorrectly, patient left before realizing this so she is been examined since then so it cannot be repeated today will simply follow.  Plan:  Continue routine obstetrical care  Reviewed: Preterm labor symptoms and general obstetric precautions including but not limited to vaginal bleeding, contractions, leaking of fluid and fetal movement were reviewed in detail with the patient.  All questions were answered  Follow-up: Return in about 2 weeks (around 10/02/2017), or if symptoms worsen or fail to improve, for LROB.  Orders Placed This Encounter  Procedures  . Fetal fibronectin  . POCT urinalysis dipstick    By signing my name below, I, Izna Ahmed, attest that this documentation has been prepared under the direction and in the presence of Tilda Burrow, MD.  Electronically Signed: Redge GainerIzna Ahmed, Medical Scribe. 09/18/17. 12:07 PM.  I personally performed the services described in this documentation, which was SCRIBED in my presence. The recorded information has been reviewed and considered accurate. It has been edited as necessary during review. Tilda BurrowJohn V Siyon Linck, MD

## 2017-09-18 NOTE — Patient Instructions (Signed)

## 2017-09-23 ENCOUNTER — Encounter (HOSPITAL_COMMUNITY): Payer: Self-pay | Admitting: *Deleted

## 2017-09-23 ENCOUNTER — Other Ambulatory Visit: Payer: Self-pay

## 2017-09-23 ENCOUNTER — Inpatient Hospital Stay (HOSPITAL_COMMUNITY)
Admission: AD | Admit: 2017-09-23 | Discharge: 2017-09-23 | Disposition: A | Discharge status: Home / Self Care | Payer: 59 | Source: Ambulatory Visit | Attending: Obstetrics and Gynecology | Admitting: Obstetrics and Gynecology

## 2017-09-23 DIAGNOSIS — O09293 Supervision of pregnancy with other poor reproductive or obstetric history, third trimester: Secondary | ICD-10-CM | POA: Insufficient documentation

## 2017-09-23 DIAGNOSIS — Z3483 Encounter for supervision of other normal pregnancy, third trimester: Secondary | ICD-10-CM

## 2017-09-23 DIAGNOSIS — Z3A32 32 weeks gestation of pregnancy: Secondary | ICD-10-CM | POA: Insufficient documentation

## 2017-09-23 DIAGNOSIS — O26893 Other specified pregnancy related conditions, third trimester: Secondary | ICD-10-CM | POA: Diagnosis not present

## 2017-09-23 DIAGNOSIS — O4693 Antepartum hemorrhage, unspecified, third trimester: Secondary | ICD-10-CM

## 2017-09-23 DIAGNOSIS — R102 Pelvic and perineal pain: Secondary | ICD-10-CM | POA: Insufficient documentation

## 2017-09-23 DIAGNOSIS — O468X3 Other antepartum hemorrhage, third trimester: Secondary | ICD-10-CM | POA: Diagnosis present

## 2017-09-23 DIAGNOSIS — O99013 Anemia complicating pregnancy, third trimester: Secondary | ICD-10-CM | POA: Insufficient documentation

## 2017-09-23 LAB — URINALYSIS, ROUTINE W REFLEX MICROSCOPIC
BILIRUBIN URINE: NEGATIVE
GLUCOSE, UA: NEGATIVE mg/dL
HGB URINE DIPSTICK: NEGATIVE
Ketones, ur: NEGATIVE mg/dL
Leukocytes, UA: NEGATIVE
Nitrite: NEGATIVE
Protein, ur: NEGATIVE mg/dL
SPECIFIC GRAVITY, URINE: 1.031 — AB (ref 1.005–1.030)
pH: 5 (ref 5.0–8.0)

## 2017-09-23 NOTE — MAU Note (Signed)
Been having really bad pain, ? Contractions and pressure. No hx of PTL or PTD.  Has had bleeding a few times in the past wk, none today.

## 2017-09-23 NOTE — Discharge Instructions (Signed)
Drink at least 8 8-oz glasses of water every day. Take Tylenol 325 mg 2 tablets by mouth every 4 hours if needed for pain. Call the office and be seen this week if you are having more problems. Return to Maternity Admissions if you have regular contractions, severe vaginal bleeding or leaking of fluid. Do not have sex if you continue to have vaginal bleeding or abdominal pain.

## 2017-09-23 NOTE — MAU Provider Note (Signed)
History     CSN: [id number]  Arrival date and time: 09/23/17 4098   First Provider Initiated Contact with Patient 09/23/17 1922      Chief Complaint  Patient presents with  . Abdominal Pain   HPI Melanie Price 26 y.o. [redacted]w[redacted]d  Comes to MAU with periodic vaginal bleeding that she has had off and on.  No vaginal bleeding today.  Was checked in the office on 09-18-17 but no vaginal bleeding was seen.  Was 1 cm in the office.  Note reviewed - normal multigravida cervix.  Having periodic abdominal pain that resolves.  Has nausea with the pain but has not had vomiting.  Is worried because she did not have this with her previous pregnancy and is worried that it might be preterm labor.  OB History    Gravida Para Term Preterm AB Living   3 1 1   1 1    SAB TAB Ectopic Multiple Live Births   1     0 1      Past Medical History:  Diagnosis Date  . Anemia   . Bleeding in early pregnancy 12/19/2015  . Contraceptive management 09/26/2015  . Encounter for Nexplanon removal 09/26/2015  . Miscarriage 12/21/2015    Past Surgical History:  Procedure Laterality Date  . NO PAST SURGERIES      Family History  Problem Relation Age of Onset  . Cancer Maternal Grandfather        skin  . Pyloric stenosis Son   . Irritable bowel syndrome Paternal Aunt   . Crohn's disease Paternal Aunt   . Heart disease Maternal Aunt   . Heart disease Maternal Uncle     Social History   Tobacco Use  . Smoking status: Never Smoker  . Smokeless tobacco: Never Used  Substance Use Topics  . Alcohol use: No  . Drug use: No    Allergies: No Known Allergies  Medications Prior to Admission  Medication Sig Dispense Refill Last Dose  . acetaminophen (TYLENOL) 500 MG tablet Take 1,000 mg by mouth every 6 (six) hours as needed for mild pain.   Taking  . omeprazole (PRILOSEC) 20 MG capsule Take 1 capsule (20 mg total) by mouth 2 (two) times daily before a meal. 60 capsule 0 Taking  . Prenatal Vit-Fe Fumarate-FA  (PRENATAL MULTIVITAMIN) TABS tablet Take 1 tablet by mouth daily at 12 noon.   Taking    Review of Systems  Constitutional: Negative for fever.  Gastrointestinal: Positive for abdominal pain. Negative for diarrhea, nausea and vomiting.  Genitourinary: Positive for vaginal bleeding. Negative for vaginal discharge.   Physical Exam   Blood pressure 114/69, pulse 72, temperature 98.3 F (36.8 C), temperature source Oral, resp. rate 17, weight 203 lb 12 oz (92.4 kg), last menstrual period 01/30/2017, SpO2 98 %.  Physical Exam  Nursing note and vitals reviewed. Constitutional: She is oriented to person, place, and time. She appears well-developed and well-nourished.  HENT:  Head: Normocephalic.  Eyes: EOM are normal.  Neck: Neck supple.  GI: Soft. There is no tenderness. There is no rebound and no guarding.  Having some pubic symphysis discomfort confirmed by palpation. On fetal monitor, FHT baseline is 140 with moderate variability.  15x15 accels noted with movement.  No contractions, no decelerations, Reactive NST  Genitourinary:  Genitourinary Comments: Speculum exam done - no bleeding seen, small amount of white discharge - no evidence of infection visually, cervix appears closed. On bimanual, cervix is normal multiparous cervix and  internal os is closed.  Musculoskeletal: Normal range of motion.  Neurological: She is alert and oriented to person, place, and time.  Skin: Skin is warm and dry.  Psychiatric: She has a normal mood and affect.    MAU Course  Procedures Results for orders placed or performed during the hospital encounter of 09/23/17 (from the past 24 hour(s))  Urinalysis, Routine w reflex microscopic     Status: Abnormal   Collection Time: 09/23/17  7:03 PM  Result Value Ref Range   Color, Urine YELLOW YELLOW   APPearance CLEAR CLEAR   Specific Gravity, Urine 1.031 (H) 1.005 - 1.030   pH 5.0 5.0 - 8.0   Glucose, UA NEGATIVE NEGATIVE mg/dL   Hgb urine dipstick  NEGATIVE NEGATIVE   Bilirubin Urine NEGATIVE NEGATIVE   Ketones, ur NEGATIVE NEGATIVE mg/dL   Protein, ur NEGATIVE NEGATIVE mg/dL   Nitrite NEGATIVE NEGATIVE   Leukocytes, UA NEGATIVE NEGATIVE    MDM From urinalysis and from client interview, she is not drinking enough fluids which could be causing some of her lower abdominal cramping.  No contractions seen on monitor and no contractions palpated by the examiner.  Discussed her current exam and findings with client and her mother.  They were reassured by the findings today and agreeable to discharge.  Assessment and Plan  Pubic symphysis pain Reported vaginal bleeding but none felt on exam Reactive NST  Plan Drink at least 8 8-oz glasses of water every day. Take Tylenol 325 mg 2 tablets by mouth every 4 hours if needed for pain. Call the office and be seen this week if you are having more problems. Return to Maternity Admissions if you have regular contractions, severe vaginal bleeding or leaking of fluid. Do not have sex if you continue to have vaginal bleeding or abdominal pain.  Melanie Price 09/23/2017, 7:31 PM

## 2017-09-25 ENCOUNTER — Ambulatory Visit (INDEPENDENT_AMBULATORY_CARE_PROVIDER_SITE_OTHER): Payer: 59 | Admitting: Advanced Practice Midwife

## 2017-09-25 VITALS — BP 112/52 | Wt 204.0 lb

## 2017-09-25 DIAGNOSIS — Z331 Pregnant state, incidental: Secondary | ICD-10-CM

## 2017-09-25 DIAGNOSIS — Z3483 Encounter for supervision of other normal pregnancy, third trimester: Secondary | ICD-10-CM

## 2017-09-25 DIAGNOSIS — Z1389 Encounter for screening for other disorder: Secondary | ICD-10-CM

## 2017-09-25 DIAGNOSIS — Z3A32 32 weeks gestation of pregnancy: Secondary | ICD-10-CM

## 2017-09-25 LAB — POCT URINALYSIS DIPSTICK
GLUCOSE UA: NEGATIVE
Ketones, UA: NEGATIVE
Nitrite, UA: NEGATIVE
Protein, UA: NEGATIVE
RBC UA: NEGATIVE

## 2017-09-25 NOTE — Patient Instructions (Addendum)
Melanie Price, I greatly value your feedback.  If you receive a survey following your visit with us today, we appreciate you taking the time to fill it out.  Thanks, Melanie Price, CNM   Call the office [phone]) or go to [facility] if:  You begin to have strong, frequent contractions  Your water breaks.  Sometimes it is a big gush of fluid, sometimes it is just a trickle that keeps getting your panties wet or running down your legs  You have vaginal bleeding.  It is normal to have a small amount of spotting if your cervix was checked.   You don't feel your baby moving like normal.  If you don't, get you something to eat and drink and lay down and focus on feeling your baby move.  You should feel at least 10 movements in 2 hours.  If you don't, you should call the office or go to [facility].    Tdap Vaccine  It is recommended that you get the Tdap vaccine during the third trimester of EACH pregnancy to help protect your baby from getting pertussis (whooping cough)  27-36 weeks is the BEST time to do this so that you can pass the protection on to your baby. During pregnancy is better than after pregnancy, but if you are unable to get it during pregnancy it will be offered at the hospital.   You can get this vaccine at the health department or your family doctor  Everyone who will be around your baby should also be up-to-date on their vaccines. Adults (who are not pregnant) only need 1 dose of Tdap during adulthood.   Third Trimester of Pregnancy The third trimester is from week 29 through week 42, months 7 through 9. The third trimester is a time when the fetus is growing rapidly. At the end of the ninth month, the fetus is about 20 inches in length and weighs 6-10 pounds.  BODY CHANGES Your body goes through many changes during pregnancy. The changes vary from woman to woman.   Your weight will continue to increase. You can expect to gain 25-35 pounds (11-16 kg) by  the end of the pregnancy.  You may begin to get stretch marks on your hips, abdomen, and breasts.  You may urinate more often because the fetus is moving lower into your pelvis and pressing on your bladder.  You may develop or continue to have heartburn as a result of your pregnancy.  You may develop constipation because certain hormones are causing the muscles that push waste through your intestines to slow down.  You may develop hemorrhoids or swollen, bulging veins (varicose veins).  You may have pelvic pain because of the weight gain and pregnancy hormones relaxing your joints between the bones in your pelvis. Backaches may result from overexertion of the muscles supporting your posture.  You may have changes in your hair. These can include thickening of your hair, rapid growth, and changes in texture. Some women also have hair loss during or after pregnancy, or hair that feels dry or thin. Your hair will most likely return to normal after your baby is born.  Your breasts will continue to grow and be tender. A yellow discharge may leak from your breasts called colostrum.  Your belly button may stick out.  You may feel short of breath because of your expanding uterus.  You may notice the fetus "dropping," or moving lower in your abdomen.  You may have a bloody mucus  discharge. This usually occurs a few days to a week before labor begins.  Your cervix becomes thin and soft (effaced) near your due date. WHAT TO EXPECT AT YOUR PRENATAL EXAMS  You will have prenatal exams every 2 weeks until week 36. Then, you will have weekly prenatal exams. During a routine prenatal visit:  You will be weighed to make sure you and the fetus are growing normally.  Your blood pressure is taken.  Your abdomen will be measured to track your baby's growth.  The fetal heartbeat will be listened to.  Any test results from the previous visit will be discussed.  You may have a cervical check near your  due date to see if you have effaced. At around 36 weeks, your caregiver will check your cervix. At the same time, your caregiver will also perform a test on the secretions of the vaginal tissue. This test is to determine if a type of bacteria, Group B streptococcus, is present. Your caregiver will explain this further. Your caregiver may ask you:  What your birth plan is.  How you are feeling.  If you are feeling the baby move.  If you have had any abnormal symptoms, such as leaking fluid, bleeding, severe headaches, or abdominal cramping.  If you have any questions. Other tests or screenings that may be performed during your third trimester include:  Blood tests that check for low iron levels (anemia).  Fetal testing to check the health, activity level, and growth of the fetus. Testing is done if you have certain medical conditions or if there are problems during the pregnancy. FALSE LABOR You may feel small, irregular contractions that eventually go away. These are called Braxton Hicks contractions, or false labor. Contractions may last for hours, days, or even weeks before true labor sets in. If contractions come at regular intervals, intensify, or become painful, it is best to be seen by your caregiver.  SIGNS OF LABOR   Menstrual-like cramps.  Contractions that are 5 minutes apart or less.  Contractions that start on the top of the uterus and spread down to the lower abdomen and back.  A sense of increased pelvic pressure or back pain.  A watery or bloody mucus discharge that comes from the vagina. If you have any of these signs before the 37th week of pregnancy, call your caregiver right away. You need to go to the hospital to get checked immediately. HOME CARE INSTRUCTIONS   Avoid all smoking, herbs, alcohol, and unprescribed drugs. These chemicals affect the formation and growth of the baby.  Follow your caregiver's instructions regarding medicine use. There are medicines  that are either safe or unsafe to take during pregnancy.  Exercise only as directed by your caregiver. Experiencing uterine cramps is a good sign to stop exercising.  Continue to eat regular, healthy meals.  Wear a good support bra for breast tenderness.  Do not use hot tubs, steam rooms, or saunas.  Wear your seat belt at all times when driving.  Avoid raw meat, uncooked cheese, cat litter boxes, and soil used by cats. These carry germs that can cause birth defects in the baby.  Take your prenatal vitamins.  Try taking a stool softener (if your caregiver approves) if you develop constipation. Eat more high-fiber foods, such as fresh vegetables or fruit and whole grains. Drink plenty of fluids to keep your urine clear or pale yellow.  Take warm sitz baths to soothe any pain or discomfort caused by hemorrhoids. Use  hemorrhoid cream if your caregiver approves.  If you develop varicose veins, wear support hose. Elevate your feet for 15 minutes, 3-4 times a day. Limit salt in your diet.  Avoid heavy lifting, wear low heal shoes, and practice good posture.  Rest a lot with your legs elevated if you have leg cramps or low back pain.  Visit your dentist if you have not gone during your pregnancy. Use a soft toothbrush to brush your teeth and be gentle when you floss.  A sexual relationship may be continued unless your caregiver directs you otherwise.  Do not travel far distances unless it is absolutely necessary and only with the approval of your caregiver.  Take prenatal classes to understand, practice, and ask questions about the labor and delivery.  Make a trial run to the hospital.  Pack your hospital bag.  Prepare the baby's nursery.  Continue to go to all your prenatal visits as directed by your caregiver. SEEK MEDICAL CARE IF:  You are unsure if you are in labor or if your water has broken.  You have dizziness.  You have mild pelvic cramps, pelvic pressure, or nagging  pain in your abdominal area.  You have persistent nausea, vomiting, or diarrhea.  You have a bad smelling vaginal discharge.  You have pain with urination. SEEK IMMEDIATE MEDICAL CARE IF:   You have a fever.  You are leaking fluid from your vagina.  You have spotting or bleeding from your vagina.  You have severe abdominal cramping or pain.  You have rapid weight loss or gain.  You have shortness of breath with chest pain.  You notice sudden or extreme swelling of your face, hands, ankles, feet, or legs.  You have not felt your baby move in over an hour.  You have severe headaches that do not go away with medicine.  You have vision changes. Document Released: 07/17/2001 Document Revised: 07/28/2013 Document Reviewed: 09/23/2012 [facility] Patient Information 2015 [employer] [place]. This information is not intended to replace advice given to you by your health care provider. Make sure you discuss any questions you have with your health care provider.  Kinesiology taping for pregnancy:  Youtube has good vidoes of "how tos" for lower back, pelvic, hip pain; swelling of feet, etc

## 2017-09-25 NOTE — Progress Notes (Signed)
[id number] 7128w3d Estimated Date of Delivery: 11/17/17  Blood pressure (!) 112/52, weight 204 lb (92.5 kg), last menstrual period 01/30/2017.   BP weight and urine results all reviewed and noted.  Please refer to the obstetrical flow sheet for the fundal height and fetal heart rate documentation:  Patient reports good fetal movement, denies any bleeding and no rupture of membranes symptoms or regular contractions. Patient is without complaints. All questions were answered.  Orders Placed This Encounter  Procedures  . POCT Urinalysis Dipstick    Plan:  Continued routine obstetrical care, maternity belt/kin tape for pressure  Return in about 2 weeks (around 10/09/2017) for LROB.

## 2017-09-27 ENCOUNTER — Inpatient Hospital Stay (HOSPITAL_COMMUNITY)
Admission: AD | Admit: 2017-09-27 | Discharge: 2017-09-27 | Disposition: A | Discharge status: Home / Self Care | Payer: 59 | Source: Ambulatory Visit | Attending: Obstetrics & Gynecology | Admitting: Obstetrics & Gynecology

## 2017-09-27 ENCOUNTER — Other Ambulatory Visit: Payer: Self-pay

## 2017-09-27 ENCOUNTER — Encounter (HOSPITAL_COMMUNITY): Payer: Self-pay | Admitting: Emergency Medicine

## 2017-09-27 DIAGNOSIS — Z3483 Encounter for supervision of other normal pregnancy, third trimester: Secondary | ICD-10-CM

## 2017-09-27 DIAGNOSIS — G43909 Migraine, unspecified, not intractable, without status migrainosus: Secondary | ICD-10-CM | POA: Insufficient documentation

## 2017-09-27 DIAGNOSIS — R51 Headache: Secondary | ICD-10-CM

## 2017-09-27 DIAGNOSIS — O9989 Other specified diseases and conditions complicating pregnancy, childbirth and the puerperium: Secondary | ICD-10-CM

## 2017-09-27 DIAGNOSIS — O99353 Diseases of the nervous system complicating pregnancy, third trimester: Secondary | ICD-10-CM | POA: Insufficient documentation

## 2017-09-27 DIAGNOSIS — O09893 Supervision of other high risk pregnancies, third trimester: Secondary | ICD-10-CM | POA: Diagnosis not present

## 2017-09-27 DIAGNOSIS — Z87898 Personal history of other specified conditions: Secondary | ICD-10-CM

## 2017-09-27 DIAGNOSIS — Z3A32 32 weeks gestation of pregnancy: Secondary | ICD-10-CM | POA: Insufficient documentation

## 2017-09-27 LAB — CBC
HCT: 31.1 % — ABNORMAL LOW (ref 36.0–46.0)
Hemoglobin: 9.8 g/dL — ABNORMAL LOW (ref 12.0–15.0)
MCH: 24.6 pg — ABNORMAL LOW (ref 26.0–34.0)
MCHC: 31.5 g/dL (ref 30.0–36.0)
MCV: 78.1 fL (ref 78.0–100.0)
Platelets: 360 10*3/uL (ref 150–400)
RBC: 3.98 MIL/uL (ref 3.87–5.11)
RDW: 15.1 % (ref 11.5–15.5)
WBC: 14.4 10*3/uL — AB (ref 4.0–10.5)

## 2017-09-27 LAB — URINALYSIS, COMPLETE (UACMP) WITH MICROSCOPIC
Bilirubin Urine: NEGATIVE
GLUCOSE, UA: NEGATIVE mg/dL
Hgb urine dipstick: NEGATIVE
Ketones, ur: NEGATIVE mg/dL
Nitrite: NEGATIVE
PROTEIN: NEGATIVE mg/dL
Specific Gravity, Urine: 1.024 (ref 1.005–1.030)
pH: 6 (ref 5.0–8.0)

## 2017-09-27 LAB — COMPREHENSIVE METABOLIC PANEL
ALBUMIN: 2.6 g/dL — AB (ref 3.5–5.0)
ALT: 37 U/L (ref 14–54)
AST: 65 U/L — AB (ref 15–41)
Alkaline Phosphatase: 245 U/L — ABNORMAL HIGH (ref 38–126)
Anion gap: 8 (ref 5–15)
BUN: 15 mg/dL (ref 6–20)
CHLORIDE: 101 mmol/L (ref 101–111)
CO2: 23 mmol/L (ref 22–32)
Calcium: 9 mg/dL (ref 8.9–10.3)
Creatinine, Ser: 0.64 mg/dL (ref 0.44–1.00)
GFR calc Af Amer: >60 mL/min (ref >60)
GLUCOSE: 87 mg/dL (ref 65–99)
POTASSIUM: 4.3 mmol/L (ref 3.5–5.1)
SODIUM: 132 mmol/L — AB (ref 135–145)
Total Bilirubin: 0.5 mg/dL (ref 0.3–1.2)
Total Protein: 7.2 g/dL (ref 6.5–8.1)

## 2017-09-27 LAB — PROTEIN / CREATININE RATIO, URINE
CREATININE, URINE: 187 mg/dL
Protein Creatinine Ratio: 0.07 mg/mg{Cre} (ref 0.00–0.15)
Total Protein, Urine: 14 mg/dL

## 2017-09-27 NOTE — MAU Provider Note (Addendum)
History    Patient Melanie Price is a 27 y.o. G3P1011 at [redacted]w[redacted]d here with complaints of headache two days ago and blurry vision at the same time. The headache came after the blurry vision (lasted 2-3 hours).    CSN: [id number]  Arrival date and time: 09/27/17 1258   None     No chief complaint on file.  Headache   This is a new problem. The current episode started in the past 7 days. Episode frequency: lasted from wednesday night into thursday. The problem has been resolved. The pain is located in the right unilateral region. The pain does not radiate. The pain quality is not similar to prior headaches. The pain is at a severity of 10/10. Associated symptoms include blurred vision. Pertinent negatives include no sinus pressure, sore throat, visual change or vomiting.  She has a history of migraines but this headache was different.   Nothing made it better or worse; she went to bed Wednesady night and it got better during the day on Thursday.   She denies bleeding, leaking of fluid, decreased fetal movements or other ob-gyn complaints.    OB History    Gravida Para Term Preterm AB Living   3 1 1   1 1    SAB TAB Ectopic Multiple Live Births   1     0 1      Past Medical History:  Diagnosis Date  . Anemia   . Bleeding in early pregnancy 12/19/2015  . Contraceptive management 09/26/2015  . Encounter for Nexplanon removal 09/26/2015  . Miscarriage 12/21/2015    Past Surgical History:  Procedure Laterality Date  . NO PAST SURGERIES      Family History  Problem Relation Age of Onset  . Cancer Maternal Grandfather        skin  . Pyloric stenosis Son   . Irritable bowel syndrome Paternal Aunt   . Crohn's disease Paternal Aunt   . Heart disease Maternal Aunt   . Heart disease Maternal Uncle     Social History   Tobacco Use  . Smoking status: Never Smoker  . Smokeless tobacco: Never Used  Substance Use Topics  . Alcohol use: No  . Drug use: No    Allergies: No Known  Allergies  Medications Prior to Admission  Medication Sig Dispense Refill Last Dose  . acetaminophen (TYLENOL) 500 MG tablet Take 1,000 mg by mouth every 6 (six) hours as needed for mild pain.   Past Week at Unknown time  . Prenatal Vit-Fe Fumarate-FA (PRENATAL MULTIVITAMIN) TABS tablet Take 1 tablet by mouth daily at 12 noon.   09/27/2017 at Unknown time  . omeprazole (PRILOSEC) 20 MG capsule Take 1 capsule (20 mg total) by mouth 2 (two) times daily before a meal. (Patient not taking: Reported on 09/27/2017) 60 capsule 0 Not Taking at Unknown time    Review of Systems  HENT: Negative for sinus pressure and sore throat.   Eyes: Positive for blurred vision.  Gastrointestinal: Negative for vomiting.  Neurological: Positive for headaches.   Physical Exam   Blood pressure (!) 115/57, pulse 96, temperature 98.2 F (36.8 C), temperature source Oral, resp. rate 16, weight 205 lb (93 kg), last menstrual period 01/30/2017, SpO2 97 %.  Physical Exam  Constitutional: She is oriented to person, place, and time. She appears well-developed and well-nourished.  HENT:  Head: Normocephalic.  Neck: Normal range of motion.  Respiratory: Effort normal.  GI: Soft.  Musculoskeletal: Normal range of motion.  Neurological: She is alert and oriented to person, place, and time.  Skin: Skin is warm and dry.  Psychiatric: She has a normal mood and affect.    MAU Course  Procedures  MDM -Patient has not had HA, blurry vision or any other complaint while in MAU.  -NST: 140 bpm, mod var, present acel, neg decels; no contractions.  -CBC, CMP, UPC> normal -BP normal while in MAU.  Assessment and Plan   1. History of headache   2. Encounter for supervision of other normal pregnancy in third trimester    2. Patient stable for discharge. Reviewed warning signs and when to return to MAU (bleeding, leaking of fluid, signs of pre-e reviewed). 3. Patient to keep appt at [facility] in two weeks.  4. All questions  answered; patient stable for discharge.  Charlesetta GaribaldiKathryn Lorraine Loni Delbridge CNM 09/27/2017, 2:27 PM

## 2017-09-27 NOTE — MAU Note (Signed)
Wed night vision started blurring, was seeing spots, lasted about 2-3 hrs.  Had a sudden sharp pain above her right eye.  Lasted all night  And most of yesterday. Not having currently.  Denies swelling or epigastric pain.

## 2017-09-27 NOTE — MAU Note (Signed)
Urine in lab 

## 2017-09-27 NOTE — Discharge Instructions (Signed)
Preeclampsia and Eclampsia °Preeclampsia is a serious condition that develops only during pregnancy. It is also called toxemia of pregnancy. This condition causes high blood pressure along with other symptoms, such as swelling and headaches. These symptoms may develop as the condition gets worse. Preeclampsia may occur at 20 weeks of pregnancy or later. °Diagnosing and treating preeclampsia early is very important. If not treated early, it can cause serious problems for you and your baby. One problem it can lead to is eclampsia, which is a condition that causes muscle jerking or shaking (convulsions or seizures) in the mother. Delivering your baby is the best treatment for preeclampsia or eclampsia. Preeclampsia and eclampsia symptoms usually go away after your baby is born. °What are the causes? °The cause of preeclampsia is not known. °What increases the risk? °The following risk factors make you more likely to develop preeclampsia: °· Being pregnant for the first time. °· Having had preeclampsia during a past pregnancy. °· Having a family history of preeclampsia. °· Having high blood pressure. °· Being pregnant with twins or triplets. °· Being 35 or older. °· Being African-American. °· Having kidney disease or diabetes. °· Having medical conditions such as lupus or blood diseases. °· Being very overweight (obese). ° °What are the signs or symptoms? °The earliest signs of preeclampsia are: °· High blood pressure. °· Increased protein in your urine. Your health care provider will check for this at every visit before you give birth (prenatal visit). ° °Other symptoms that may develop as the condition gets worse include: °· Severe headaches. °· Sudden weight gain. °· Swelling of the hands, face, legs, and feet. °· Nausea and vomiting. °· Vision problems, such as blurred or double vision. °· Numbness in the face, arms, legs, and feet. °· Urinating less than usual. °· Dizziness. °· Slurred speech. °· Abdominal pain,  especially upper abdominal pain. °· Convulsions or seizures. ° °Symptoms generally go away after giving birth. °How is this diagnosed? °There are no screening tests for preeclampsia. Your health care provider will ask you about symptoms and check for signs of preeclampsia during your prenatal visits. You may also have tests that include: °· Urine tests. °· Blood tests. °· Checking your blood pressure. °· Monitoring your baby’s heart rate. °· Ultrasound. ° °How is this treated? °You and your health care provider will determine the treatment approach that is best for you. Treatment may include: °· Having more frequent prenatal exams to check for signs of preeclampsia, if you have an increased risk for preeclampsia. °· Bed rest. °· Reducing how much salt (sodium) you eat. °· Medicine to lower your blood pressure. °· Staying in the hospital, if your condition is severe. There, treatment will focus on controlling your blood pressure and the amount of fluids in your body (fluid retention). °· You may need to take medicine (magnesium sulfate) to prevent seizures. This medicine may be given as an injection or through an IV tube. °· Delivering your baby early, if your condition gets worse. You may have your labor started with medicine (induced), or you may have a cesarean delivery. ° °Follow these instructions at home: °Eating and drinking ° °· Drink enough fluid to keep your urine clear or pale yellow. °· Eat a healthy diet that is low in sodium. Do not add salt to your food. Check nutrition labels to see how much sodium a food or beverage contains. °· Avoid caffeine. °Lifestyle °· Do not use any products that contain nicotine or tobacco, such as cigarettes   and e-cigarettes. If you need help quitting, ask your health care provider. °· Do not use alcohol or drugs. °· Avoid stress as much as possible. Rest and get plenty of sleep. °General instructions °· Take over-the-counter and prescription medicines only as told by your  health care provider. °· When lying down, lie on your side. This keeps pressure off of your baby. °· When sitting or lying down, raise (elevate) your feet. Try putting some pillows underneath your lower legs. °· Exercise regularly. Ask your health care provider what kinds of exercise are best for you. °· Keep all follow-up and prenatal visits as told by your health care provider. This is important. °How is this prevented? °To prevent preeclampsia or eclampsia from developing during another pregnancy: °· Get proper medical care during pregnancy. Your health care provider may be able to prevent preeclampsia or diagnose and treat it early. °· Your health care provider may have you take a low-dose aspirin or a calcium supplement during your next pregnancy. °· You may have tests of your blood pressure and kidney function after giving birth. °· Maintain a healthy weight. Ask your health care provider for help managing weight gain during pregnancy. °· Work with your health care provider to manage any long-term (chronic) health conditions you have, such as diabetes or kidney problems. ° °Contact a health care provider if: °· You gain more weight than expected. °· You have headaches. °· You have nausea or vomiting. °· You have abdominal pain. °· You feel dizzy or light-headed. °Get help right away if: °· You develop sudden or severe swelling anywhere in your body. This usually happens in the legs. °· You gain 5 lbs (2.3 kg) or more during one week. °· You have severe: °? Abdominal pain. °? Headaches. °? Dizziness. °? Vision problems. °? Confusion. °? Nausea or vomiting. °· You have a seizure. °· You have trouble moving any part of your body. °· You develop numbness in any part of your body. °· You have trouble speaking. °· You have any abnormal bleeding. °· You pass out. °This information is not intended to replace advice given to you by your health care provider. Make sure you discuss any questions you have with your health  care provider. °Document Released: 07/20/2000 Document Revised: 03/20/2016 Document Reviewed: 02/27/2016 °Elsevier Interactive Patient Education © 2018 [employer] ° °

## 2017-10-08 ENCOUNTER — Encounter: Payer: Self-pay | Admitting: Obstetrics & Gynecology

## 2017-10-08 ENCOUNTER — Ambulatory Visit (INDEPENDENT_AMBULATORY_CARE_PROVIDER_SITE_OTHER): Payer: 59 | Admitting: Obstetrics & Gynecology

## 2017-10-08 VITALS — BP 114/66 | Wt 211.0 lb

## 2017-10-08 DIAGNOSIS — Z3483 Encounter for supervision of other normal pregnancy, third trimester: Secondary | ICD-10-CM

## 2017-10-08 DIAGNOSIS — Z331 Pregnant state, incidental: Secondary | ICD-10-CM

## 2017-10-08 DIAGNOSIS — Z3A34 34 weeks gestation of pregnancy: Secondary | ICD-10-CM

## 2017-10-08 DIAGNOSIS — Z1389 Encounter for screening for other disorder: Secondary | ICD-10-CM

## 2017-10-08 LAB — POCT URINALYSIS DIPSTICK
Glucose, UA: NEGATIVE
Ketones, UA: NEGATIVE
LEUKOCYTES UA: NEGATIVE
NITRITE UA: NEGATIVE
Protein, UA: NEGATIVE
RBC UA: NEGATIVE

## 2017-10-08 NOTE — Progress Notes (Signed)
[id number] 2973w2d Estimated Date of Delivery: 11/17/17  Blood pressure 114/66, weight 211 lb (95.7 kg), last menstrual period 01/30/2017.   BP weight and urine results all reviewed and noted.  Please refer to the obstetrical flow sheet for the fundal height and fetal heart rate documentation:  Patient reports good fetal movement, denies any bleeding and no rupture of membranes symptoms or regular contractions. Patient is without complaints. All questions were answered.  Orders Placed This Encounter  Procedures  . POCT Urinalysis Dipstick    Plan:  Continued routine obstetrical care,   Return in about 2 weeks (around 10/22/2017) for LROB.

## 2017-10-14 ENCOUNTER — Encounter (HOSPITAL_COMMUNITY): Payer: Self-pay

## 2017-10-14 ENCOUNTER — Inpatient Hospital Stay (EMERGENCY_DEPARTMENT_HOSPITAL)
Admission: AD | Admit: 2017-10-14 | Discharge: 2017-10-15 | Disposition: A | Discharge status: Home / Self Care | Payer: 59 | Source: Ambulatory Visit | Attending: Obstetrics and Gynecology | Admitting: Obstetrics and Gynecology

## 2017-10-14 DIAGNOSIS — Z3A35 35 weeks gestation of pregnancy: Secondary | ICD-10-CM | POA: Diagnosis not present

## 2017-10-14 DIAGNOSIS — O133 Gestational [pregnancy-induced] hypertension without significant proteinuria, third trimester: Secondary | ICD-10-CM | POA: Diagnosis not present

## 2017-10-14 DIAGNOSIS — G43109 Migraine with aura, not intractable, without status migrainosus: Secondary | ICD-10-CM

## 2017-10-14 DIAGNOSIS — Z3689 Encounter for other specified antenatal screening: Secondary | ICD-10-CM

## 2017-10-14 DIAGNOSIS — O1414 Severe pre-eclampsia complicating childbirth: Secondary | ICD-10-CM | POA: Diagnosis not present

## 2017-10-14 DIAGNOSIS — O99353 Diseases of the nervous system complicating pregnancy, third trimester: Secondary | ICD-10-CM | POA: Insufficient documentation

## 2017-10-14 DIAGNOSIS — O169 Unspecified maternal hypertension, unspecified trimester: Secondary | ICD-10-CM

## 2017-10-14 HISTORY — DX: Headache: R51

## 2017-10-14 HISTORY — DX: Depression, unspecified: F32.A

## 2017-10-14 HISTORY — DX: Major depressive disorder, single episode, unspecified: F32.9

## 2017-10-14 HISTORY — DX: Headache, unspecified: R51.9

## 2017-10-14 LAB — CBC
HEMATOCRIT: 31.5 % — AB (ref 36.0–46.0)
HEMOGLOBIN: 10 g/dL — AB (ref 12.0–15.0)
MCH: 24.7 pg — ABNORMAL LOW (ref 26.0–34.0)
MCHC: 31.7 g/dL (ref 30.0–36.0)
MCV: 77.8 fL — ABNORMAL LOW (ref 78.0–100.0)
Platelets: 352 10*3/uL (ref 150–400)
RBC: 4.05 MIL/uL (ref 3.87–5.11)
RDW: 15.6 % — ABNORMAL HIGH (ref 11.5–15.5)
WBC: 14.7 10*3/uL — AB (ref 4.0–10.5)

## 2017-10-14 NOTE — MAU Note (Signed)
Pt has a headache, has had for "1-2 hrs" family member took BP at home and it was "high 139/100"

## 2017-10-14 NOTE — MAU Note (Signed)
Pt states she had a high blood pressure today at home. States she has a HA and sharp pain under left eyebrow-last took 2 extra strengthTylenol about 30 mins ago and seeing flashing lights. States her BP at home was 139/100. States she has not had HBP during this pregnancy. States she has a hx of migraines

## 2017-10-15 ENCOUNTER — Encounter: Payer: Self-pay | Admitting: Obstetrics & Gynecology

## 2017-10-15 ENCOUNTER — Ambulatory Visit (INDEPENDENT_AMBULATORY_CARE_PROVIDER_SITE_OTHER): Payer: 59 | Admitting: Obstetrics & Gynecology

## 2017-10-15 ENCOUNTER — Other Ambulatory Visit: Payer: Self-pay

## 2017-10-15 VITALS — BP 110/80 | HR 97 | Wt 211.4 lb

## 2017-10-15 DIAGNOSIS — Z3689 Encounter for other specified antenatal screening: Secondary | ICD-10-CM

## 2017-10-15 DIAGNOSIS — Z1389 Encounter for screening for other disorder: Secondary | ICD-10-CM

## 2017-10-15 DIAGNOSIS — Z3A35 35 weeks gestation of pregnancy: Secondary | ICD-10-CM

## 2017-10-15 DIAGNOSIS — Z331 Pregnant state, incidental: Secondary | ICD-10-CM

## 2017-10-15 DIAGNOSIS — G43109 Migraine with aura, not intractable, without status migrainosus: Secondary | ICD-10-CM | POA: Diagnosis not present

## 2017-10-15 DIAGNOSIS — R51 Headache: Secondary | ICD-10-CM

## 2017-10-15 DIAGNOSIS — O133 Gestational [pregnancy-induced] hypertension without significant proteinuria, third trimester: Secondary | ICD-10-CM

## 2017-10-15 DIAGNOSIS — H8123 Vestibular neuronitis, bilateral: Secondary | ICD-10-CM

## 2017-10-15 DIAGNOSIS — O9989 Other specified diseases and conditions complicating pregnancy, childbirth and the puerperium: Secondary | ICD-10-CM

## 2017-10-15 DIAGNOSIS — Z3483 Encounter for supervision of other normal pregnancy, third trimester: Secondary | ICD-10-CM

## 2017-10-15 DIAGNOSIS — O368130 Decreased fetal movements, third trimester, not applicable or unspecified: Secondary | ICD-10-CM | POA: Diagnosis not present

## 2017-10-15 LAB — COMPREHENSIVE METABOLIC PANEL
ALK PHOS: 229 U/L — AB (ref 38–126)
ALT: 23 U/L (ref 14–54)
AST: 47 U/L — AB (ref 15–41)
Albumin: 2.7 g/dL — ABNORMAL LOW (ref 3.5–5.0)
Anion gap: 10 (ref 5–15)
BUN: 19 mg/dL (ref 6–20)
CALCIUM: 9.6 mg/dL (ref 8.9–10.3)
CHLORIDE: 102 mmol/L (ref 101–111)
CO2: 22 mmol/L (ref 22–32)
CREATININE: 0.83 mg/dL (ref 0.44–1.00)
GFR calc non Af Amer: >60 mL/min (ref >60)
GLUCOSE: 80 mg/dL (ref 65–99)
Potassium: 4 mmol/L (ref 3.5–5.1)
Sodium: 134 mmol/L — ABNORMAL LOW (ref 135–145)
Total Bilirubin: 0.6 mg/dL (ref 0.3–1.2)
Total Protein: 7 g/dL (ref 6.5–8.1)

## 2017-10-15 LAB — POCT URINALYSIS DIPSTICK
Blood, UA: NEGATIVE
Glucose, UA: NEGATIVE
KETONES UA: NEGATIVE
LEUKOCYTES UA: NEGATIVE
NITRITE UA: NEGATIVE
PROTEIN UA: 1

## 2017-10-15 LAB — URINALYSIS, ROUTINE W REFLEX MICROSCOPIC
Bilirubin Urine: NEGATIVE
GLUCOSE, UA: NEGATIVE mg/dL
Hgb urine dipstick: NEGATIVE
KETONES UR: NEGATIVE mg/dL
LEUKOCYTES UA: NEGATIVE
NITRITE: NEGATIVE
PH: 7 (ref 5.0–8.0)
Protein, ur: NEGATIVE mg/dL
SPECIFIC GRAVITY, URINE: 1.015 (ref 1.005–1.030)

## 2017-10-15 LAB — PROTEIN / CREATININE RATIO, URINE
Creatinine, Urine: 99 mg/dL
Protein Creatinine Ratio: 0.13 mg/mg{Cre} (ref 0.00–0.15)
Total Protein, Urine: 13 mg/dL

## 2017-10-15 MED ORDER — METOCLOPRAMIDE HCL 5 MG/ML IJ SOLN
10.0000 mg | Freq: Once | INTRAMUSCULAR | Status: AC
  Administered 2017-10-15: 10 mg via INTRAVENOUS
  Filled 2017-10-15: qty 2

## 2017-10-15 MED ORDER — LACTATED RINGERS IV BOLUS (SEPSIS)
1000.0000 mL | Freq: Once | INTRAVENOUS | Status: AC
  Administered 2017-10-15: 1000 mL via INTRAVENOUS

## 2017-10-15 MED ORDER — DEXAMETHASONE SODIUM PHOSPHATE 10 MG/ML IJ SOLN
10.0000 mg | Freq: Once | INTRAMUSCULAR | Status: AC
  Administered 2017-10-15: 10 mg via INTRAVENOUS
  Filled 2017-10-15: qty 1

## 2017-10-15 MED ORDER — OXYCODONE HCL 5 MG PO TABS
10.0000 mg | ORAL_TABLET | Freq: Once | ORAL | Status: AC
  Administered 2017-10-15: 10 mg via ORAL
  Filled 2017-10-15: qty 2

## 2017-10-15 MED ORDER — DIPHENHYDRAMINE HCL 50 MG/ML IJ SOLN
25.0000 mg | Freq: Once | INTRAMUSCULAR | Status: AC
  Administered 2017-10-15: 25 mg via INTRAVENOUS
  Filled 2017-10-15: qty 1

## 2017-10-15 NOTE — MAU Provider Note (Signed)
History    G3P1011 @35 .2 wks here with HA and visual disturbance. Sx started about 2 hrs ago. She first saw zig zag white flashes out of right eye then began having a left peri orbital HA. Rates pain 10/10. She took Tylenol about 30 min ago. She took her BP at home and was 139/100. Denies epigastric pain, SOB, and CP. Reports good FM. No VB, LOF, or ctx. She was seen last month in MAU for similar sx and pre-e w/u was neg. No hx of HTN. Hx of migraines but doesn't usually affect her vision.  CSN: [id number]  Arrival date and time: 10/14/17 2320   First Provider Initiated Contact with Patient 10/15/17 0006      Chief Complaint  Patient presents with  . Hypertension  . Headache    OB History    Gravida Para Term Preterm AB Living   3 1 1   1 1    SAB TAB Ectopic Multiple Live Births   1     0 1      Past Medical History:  Diagnosis Date  . Anemia   . Bleeding in early pregnancy 12/19/2015  . Contraceptive management 09/26/2015  . Depression    post-partum depression  . Encounter for Nexplanon removal 09/26/2015  . Headache    migraine  . Miscarriage 12/21/2015    Past Surgical History:  Procedure Laterality Date  . NO PAST SURGERIES      Family History  Problem Relation Age of Onset  . Cancer Maternal Grandfather        skin  . Pyloric stenosis Son   . Irritable bowel syndrome Paternal Aunt   . Crohn's disease Paternal Aunt   . Heart disease Maternal Aunt   . Heart disease Maternal Uncle     Social History   Tobacco Use  . Smoking status: Never Smoker  . Smokeless tobacco: Never Used  Substance Use Topics  . Alcohol use: No  . Drug use: No    Allergies: No Known Allergies  Medications Prior to Admission  Medication Sig Dispense Refill Last Dose  . acetaminophen (TYLENOL) 500 MG tablet Take 1,000 mg by mouth every 6 (six) hours as needed for mild pain.   10/14/2017 at Unknown time  . omeprazole (PRILOSEC) 20 MG capsule Take 1 capsule (20 mg total) by mouth 2  (two) times daily before a meal. 60 capsule 0 10/14/2017 at Unknown time  . Prenatal Vit-Fe Fumarate-FA (PRENATAL MULTIVITAMIN) TABS tablet Take 1 tablet by mouth daily at 12 noon.   10/14/2017 at Unknown time    Review of Systems  Eyes: Positive for visual disturbance.  Respiratory: Negative for shortness of breath.   Cardiovascular: Negative for chest pain.  Gastrointestinal: Negative for abdominal pain.  Genitourinary: Negative for vaginal bleeding.  Neurological: Positive for headaches.   Physical Exam   Blood pressure 126/80, pulse (!) 57, temperature 97.6 F (36.4 C), temperature source Oral, resp. rate 17, height 5\' 7"  (1.702 m), weight 212 lb (96.2 kg), last menstrual period 01/30/2017, SpO2 95 %.  Patient Vitals for the past 24 hrs:  BP Temp Temp src Pulse Resp SpO2 Height Weight  10/15/17 0116 126/80 - - (!) 57 - - - -  10/15/17 0101 136/80 - - (!) 56 - - - -  10/15/17 0046 131/77 - - (!) 53 - - - -  10/15/17 0031 122/72 - - 64 - - - -  10/15/17 0016 125/78 - - 64 - - - -  10/15/17 0001 (!) 144/91 - - (!) 57 - - - -  10/14/17 2349 (!) 141/90 - - 60 - - - -  10/14/17 2330 (!) 158/86 97.6 F (36.4 C) Oral 68 17 95 % 5\' 7"  (1.702 m) 212 lb (96.2 kg)    Physical Exam  Nursing note and vitals reviewed. Constitutional: She is oriented to person, place, and time. She appears well-developed and well-nourished. No distress.  HENT:  Head: Normocephalic and atraumatic.  Neck: Normal range of motion.  Respiratory: Effort normal. No respiratory distress.  Musculoskeletal: Normal range of motion. She exhibits no edema.  Neurological: She is alert and oriented to person, place, and time. She has normal reflexes. She displays normal reflexes.  Skin: Skin is warm and dry.  Psychiatric: She has a normal mood and affect.  EFM: 135 bpm, mod variability, + accels, no decels Toco: none  Results for orders placed or performed during the hospital encounter of 10/14/17 (from the past 24  hour(s))  Protein / creatinine ratio, urine     Status: None   Collection Time: 10/14/17 11:33 PM  Result Value Ref Range   Creatinine, Urine 99.00 mg/dL   Total Protein, Urine 13 mg/dL   Protein Creatinine Ratio 0.13 0.00 - 0.15 mg/mg[Cre]  Urinalysis, Routine w reflex microscopic     Status: None   Collection Time: 10/14/17 11:33 PM  Result Value Ref Range   Color, Urine YELLOW YELLOW   APPearance CLEAR CLEAR   Specific Gravity, Urine 1.015 1.005 - 1.030   pH 7.0 5.0 - 8.0   Glucose, UA NEGATIVE NEGATIVE mg/dL   Hgb urine dipstick NEGATIVE NEGATIVE   Bilirubin Urine NEGATIVE NEGATIVE   Ketones, ur NEGATIVE NEGATIVE mg/dL   Protein, ur NEGATIVE NEGATIVE mg/dL   Nitrite NEGATIVE NEGATIVE   Leukocytes, UA NEGATIVE NEGATIVE  CBC     Status: Abnormal   Collection Time: 10/14/17 11:41 PM  Result Value Ref Range   WBC 14.7 (H) 4.0 - 10.5 K/uL   RBC 4.05 3.87 - 5.11 MIL/uL   Hemoglobin 10.0 (L) 12.0 - 15.0 g/dL   HCT 16.1 (L) 09.6 - 04.5 %   MCV 77.8 (L) 78.0 - 100.0 fL   MCH 24.7 (L) 26.0 - 34.0 pg   MCHC 31.7 30.0 - 36.0 g/dL   RDW 40.9 (H) 81.1 - 91.4 %   Platelets 352 150 - 400 K/uL  Comprehensive metabolic panel     Status: Abnormal   Collection Time: 10/14/17 11:41 PM  Result Value Ref Range   Sodium 134 (L) 135 - 145 mmol/L   Potassium 4.0 3.5 - 5.1 mmol/L   Chloride 102 101 - 111 mmol/L   CO2 22 22 - 32 mmol/L   Glucose, Bld 80 65 - 99 mg/dL   BUN 19 6 - 20 mg/dL   Creatinine, Ser 7.82 0.44 - 1.00 mg/dL   Calcium 9.6 8.9 - 95.6 mg/dL   Total Protein 7.0 6.5 - 8.1 g/dL   Albumin 2.7 (L) 3.5 - 5.0 g/dL   AST 47 (H) 15 - 41 U/L   ALT 23 14 - 54 U/L   Alkaline Phosphatase 229 (H) 38 - 126 U/L   Total Bilirubin 0.6 0.3 - 1.2 mg/dL   GFR calc non Af Amer >60 >60 mL/min   GFR calc Af Amer >60 >60 mL/min   Anion gap 10 5 - 15   MAU Course  Procedures Oxycodone LR Benadryl Reglan Decadron  MDM Labs ordered and reviewed. Mildly elevated  BPs initially then  normotensive. No evidence of pre-e. HA not resolved after Oxy, HA cocktail ordered.  HA now resolved. No further visual disturbances. Stable for discharge home.  Assessment and Plan   1. [redacted] weeks gestation of pregnancy   2. NST (non-stress test) reactive   3. Elevated blood pressure affecting pregnancy, antepartum   4. Migraine with aura and without status migrainosus, not intractable    Discharge home Follow up in OB office as scheduled Pre-e precautions Tylenol prn  Allergies as of 10/15/2017   No Known Allergies     Medication List    TAKE these medications   acetaminophen 500 MG tablet Commonly known as:  TYLENOL Take 1,000 mg by mouth every 6 (six) hours as needed for mild pain.   omeprazole 20 MG capsule Commonly known as:  PRILOSEC Take 1 capsule (20 mg total) by mouth 2 (two) times daily before a meal.   prenatal multivitamin Tabs tablet Take 1 tablet by mouth daily at 12 noon.      Donette Larry, CNM 10/15/2017, 2:42 AM

## 2017-10-15 NOTE — Progress Notes (Signed)
Chief Complaint  Patient presents with  . work-in-ob    Seem Women's last night/ today ringing ears/ baby not moving much today      27 y.o. G3P1011 Patient's last menstrual period was 01/30/2017 (approximate). The current method of family planning is pregnant.  No facility-administered encounter medications on file as of 10/15/2017.    Outpatient Encounter Medications as of 10/15/2017  Medication Sig  . acetaminophen (TYLENOL) 500 MG tablet Take 1,000 mg by mouth every 6 (six) hours as needed for mild pain.  Marland Kitchen omeprazole (PRILOSEC) 20 MG capsule Take 1 capsule (20 mg total) by mouth 2 (two) times daily before a meal.  . Prenatal Vit-Fe Fumarate-FA (PRENATAL MULTIVITAMIN) TABS tablet Take 1 tablet by mouth daily at 12 noon.    Subjective Melanie Price comes into the office today 8 hours after being seen in maternity admissions unit complaining of headache and decreased fetal movement She had a preeclampsia workup in the maternity admissions unit and I reviewed that today all of her labs are normal including protein creatinine ratio Her blood pressure was slightly elevated at the time she was initially seen but then normalized Today she complaining of a headache which is been persistent no real change there Some ringing in her ears and dizziness The dizziness is rotational with movement of head and also rising consistent with vestibular neuritis Past Medical History:  Diagnosis Date  . Anemia   . Bleeding in early pregnancy 12/19/2015  . Contraceptive management 09/26/2015  . Depression    post-partum depression  . Encounter for Nexplanon removal 09/26/2015  . Headache    migraine  . Miscarriage 12/21/2015    Past Surgical History:  Procedure Laterality Date  . NO PAST SURGERIES      OB History    Gravida Para Term Preterm AB Living   3 1 1   1 1    SAB TAB Ectopic Multiple Live Births   1     0 1      No Known Allergies  Social History   Socioeconomic  History  . Marital status: Married    Spouse name: None  . Number of children: None  . Years of education: None  . Highest education level: None  Social Needs  . Financial resource strain: None  . Food insecurity - worry: None  . Food insecurity - inability: None  . Transportation needs - medical: None  . Transportation needs - non-medical: None  Occupational History  . None  Tobacco Use  . Smoking status: Never Smoker  . Smokeless tobacco: Never Used  Substance and Sexual Activity  . Alcohol use: No  . Drug use: No  . Sexual activity: Yes    Birth control/protection: None  Other Topics Concern  . None  Social History Narrative  . None    Family History  Problem Relation Age of Onset  . Cancer Maternal Grandfather        skin  . Pyloric stenosis Son   . Irritable bowel syndrome Paternal Aunt   . Crohn's disease Paternal Aunt   . Heart disease Maternal Aunt   . Heart disease Maternal Uncle     Medications:      No current facility-administered medications for this visit.  No current outpatient medications on file.  Facility-Administered Medications Ordered in Other Visits:  .  acetaminophen (TYLENOL) tablet 650 mg, 650 mg, Oral, Q4H PRN, Rasch, Jennifer I, NP, 650 mg at 10/18/17 1351 .  diphenhydrAMINE (BENADRYL) injection 12.5 mg, 12.5 mg, Intravenous, Q15 min PRN, Cristela BlueJackson, Kyle, MD .  ePHEDrine injection 10 mg, 10 mg, Intravenous, PRN, Cristela BlueJackson, Kyle, MD .  ePHEDrine injection 10 mg, 10 mg, Intravenous, PRN, Cristela BlueJackson, Kyle, MD .  fentaNYL (SUBLIMAZE) injection 100 mcg, 100 mcg, Intravenous, Q1H PRN, Ellwood Denseumball, Alison, DO .  fentaNYL 2.5 mcg/ml w/bupivacaine 0.1% in NS 100ml epidural infusion (WH-ANES), 14 mL/hr, Epidural, Continuous PRN, Cristela BlueJackson, Kyle, MD .  hydrALAZINE (APRESOLINE) injection 10 mg, 10 mg, Intravenous, Once PRN, Rasch, Victorino DikeJennifer I, NP .  labetalol (NORMODYNE,TRANDATE) injection 20-80 mg, 20-80 mg, Intravenous, Q10 min PRN, Rasch, Jennifer I, NP, 40 mg at  10/18/17 1136 .  lactated ringers infusion 500 mL, 500 mL, Intravenous, Once, Cristela BlueJackson, Kyle, MD .  lactated ringers infusion 500-1,000 mL, 500-1,000 mL, Intravenous, PRN, Rasch, Victorino DikeJennifer I, NP .  lactated ringers infusion, , Intravenous, Continuous, Rasch, Victorino DikeJennifer I, NP, Last Rate: 100 mL/hr at 10/18/17 1707 .  lidocaine (PF) (XYLOCAINE) 1 % injection 30 mL, 30 mL, Subcutaneous, PRN, Rasch, Victorino DikeJennifer I, NP .  magnesium sulfate 40 grams in LR 500 mL OB infusion, 2 g/hr, Intravenous, Titrated, Rasch, Jennifer I, NP, Last Rate: 25 mL/hr at 10/18/17 1233, 2 g/hr at 10/18/17 1233 .  misoprostol (CYTOTEC) tablet 25 mcg, 25 mcg, Vaginal, Q4H PRN, Ellwood DenseRumball, Alison, DO, 25 mcg at 10/18/17 1343 .  misoprostol (CYTOTEC) tablet 50 mcg, 50 mcg, Oral, Q4H, Raelyn MoraDawson, Rolitta, CNM, 50 mcg at 10/18/17 1849 .  ondansetron (ZOFRAN) injection 4 mg, 4 mg, Intravenous, Q6H PRN, Rasch, Victorino DikeJennifer I, NP .  oxytocin (PITOCIN) IV BOLUS FROM BAG, 500 mL, Intravenous, Once, Rasch, Victorino DikeJennifer I, NP .  oxytocin (PITOCIN) IV infusion 40 units in LR 1000 mL - Premix, 2.5 Units/hr, Intravenous, Continuous, Rasch, Victorino DikeJennifer I, NP .  PHENYLephrine 40 mcg/ml in normal saline Adult IV Push Syringe, 80 mcg, Intravenous, PRN, Cristela BlueJackson, Kyle, MD .  PHENYLephrine 40 mcg/ml in normal saline Adult IV Push Syringe, 80 mcg, Intravenous, PRN, Cristela BlueJackson, Kyle, MD .  sodium citrate-citric acid (ORACIT) solution 30 mL, 30 mL, Oral, Q2H PRN, Rasch, Victorino DikeJennifer I, NP .  terbutaline (BRETHINE) injection 0.25 mg, 0.25 mg, Subcutaneous, Once PRN, Ellwood Denseumball, Alison, DO  Objective Blood pressure 110/80, pulse 97, weight 211 lb 6.4 oz (95.9 kg), last menstrual period 01/30/2017.  Fetal heart rate is 140 with reactive NST Edema is minimal DTRs 2+ with no clonus  Pertinent ROS No burning with urination, frequency or urgency No nausea, vomiting or diarrhea Nor fever chills or other constitutional symptoms   Labs or studies I have extensively reviewed her labs  and visit his attorney admissions unit 8 hours ago    Impression Diagnoses this Encounter::   ICD-10-CM   1. Acute vestibular neuronitis of both ears H81.23   2. Encounter for supervision of other normal pregnancy in third trimester Z34.83   3. Pregnant state, incidental Z33.1 POCT urinalysis dipstick  4. Screening for genitourinary condition Z13.89 POCT urinalysis dipstick  5. Decreased fetal movements in third trimester, single or unspecified fetus O36.8130     Established relevant diagnosis(es):   Plan/Recommendations: No orders of the defined types were placed in this encounter.   Labs or Scans Ordered: Orders Placed This Encounter  Procedures  . POCT urinalysis dipstick    Management:: Probable vestibulitis No evidence of pre eclampsia at 5this point with normal labs 8 hours ago and normal BP in the office There is reassuring fetal heart heart rate pattern surveillance She is having some nausea  but no vomiting which makes me not want to treat her vestibular neuritis at this point  Follow up Return for keep scheduled.      All questions were answered.

## 2017-10-15 NOTE — Discharge Instructions (Signed)
Hypertension During Pregnancy Hypertension is also called high blood pressure. High blood pressure means that the force of your blood moving in your body is too strong. When you are pregnant, this condition should be watched carefully. It can cause problems for you and your baby. Follow these instructions at home: Eating and drinking  Drink enough fluid to keep your pee (urine) clear or pale yellow.  Eat healthy foods that are low in salt (sodium). ? Do not add salt to your food. ? Check labels on foods and drinks to see much salt is in them. Look on the label where you see "Sodium." Lifestyle  Do not use any products that contain nicotine or tobacco, such as cigarettes and e-cigarettes. If you need help quitting, ask your doctor.  Do not use alcohol.  Avoid caffeine.  Avoid stress. Rest and get plenty of sleep. General instructions  Take over-the-counter and prescription medicines only as told by your doctor.  While lying down, lie on your left side. This keeps pressure off your baby.  While sitting or lying down, raise (elevate) your feet. Try putting some pillows under your lower legs.  Exercise regularly. Ask your doctor what kinds of exercise are best for you.  Keep all prenatal and follow-up visits as told by your doctor. This is important. Contact a doctor if:  You have symptoms that your doctor told you to watch for, such as: ? Fever. ? Throwing up (vomiting). ? Headache. Get help right away if:  You have very bad pain in your belly (abdomen).  You are throwing up, and this does not get better with treatment.  You suddenly get swelling in your hands, ankles, or face.  You gain 4 lb (1.8 kg) or more in 1 week.  You get bleeding from your vagina.  You have blood in your pee.  You do not feel your baby moving as much as normal.  You have a change in vision.  You have muscle twitching or sudden tightening (spasms).  You have trouble breathing.  Your lips  or fingernails turn blue. This information is not intended to replace advice given to you by your health care provider. Make sure you discuss any questions you have with your health care provider. Document Released: 08/25/2010 Document Revised: 04/03/2016 Document Reviewed: 04/03/2016 Elsevier Interactive Patient Education  2018 [employer]. Migraine Headache A migraine headache is a very strong throbbing pain on one side or both sides of your head. Migraines can also cause other symptoms. Talk with your doctor about what things may bring on (trigger) your migraine headaches. Follow these instructions at home: Medicines  Take over-the-counter and prescription medicines only as told by your doctor.  Do not drive or use heavy machinery while taking prescription pain medicine.  To prevent or treat constipation while you are taking prescription pain medicine, your doctor may recommend that you: ? Drink enough fluid to keep your pee (urine) clear or pale yellow. ? Take over-the-counter or prescription medicines. ? Eat foods that are high in fiber. These include fresh fruits and vegetables, whole grains, and beans. ? Limit foods that are high in fat and processed sugars. These include fried and sweet foods. Lifestyle  Avoid alcohol.  Do not use any products that contain nicotine or tobacco, such as cigarettes and e-cigarettes. If you need help quitting, ask your doctor.  Get at least 8 hours of sleep every night.  Limit your stress. General instructions   Keep a journal to find out what  may bring on your migraines. For example, write down: ? What you eat and drink. ? How much sleep you get. ? Any change in what you eat or drink. ? Any change in your medicines.  If you have a migraine: ? Avoid things that make your symptoms worse, such as bright lights. ? It may help to lie down in a dark, quiet room. ? Do not drive or use heavy machinery. ? Ask your doctor what activities are safe  for you.  Keep all follow-up visits as told by your doctor. This is important. Contact a doctor if:  You get a migraine that is different or worse than your usual migraines. Get help right away if:  Your migraine gets very bad.  You have a fever.  You have a stiff neck.  You have trouble seeing.  Your muscles feel weak or like you cannot control them.  You start to lose your balance a lot.  You start to have trouble walking.  You pass out (faint). This information is not intended to replace advice given to you by your health care provider. Make sure you discuss any questions you have with your health care provider. Document Released: 05/01/2008 Document Revised: 02/10/2016 Document Reviewed: 01/09/2016 Elsevier Interactive Patient Education  2018 [employer]

## 2017-10-17 ENCOUNTER — Encounter: Payer: Self-pay | Admitting: Advanced Practice Midwife

## 2017-10-17 ENCOUNTER — Telehealth: Payer: Self-pay | Admitting: *Deleted

## 2017-10-17 ENCOUNTER — Other Ambulatory Visit: Payer: Self-pay

## 2017-10-17 ENCOUNTER — Ambulatory Visit (INDEPENDENT_AMBULATORY_CARE_PROVIDER_SITE_OTHER): Payer: 59 | Admitting: Advanced Practice Midwife

## 2017-10-17 VITALS — BP 122/74 | HR 71 | Wt 214.0 lb

## 2017-10-17 DIAGNOSIS — O368130 Decreased fetal movements, third trimester, not applicable or unspecified: Secondary | ICD-10-CM | POA: Diagnosis not present

## 2017-10-17 DIAGNOSIS — H43399 Other vitreous opacities, unspecified eye: Secondary | ICD-10-CM | POA: Diagnosis not present

## 2017-10-17 DIAGNOSIS — Z331 Pregnant state, incidental: Secondary | ICD-10-CM

## 2017-10-17 DIAGNOSIS — R51 Headache: Secondary | ICD-10-CM | POA: Diagnosis not present

## 2017-10-17 DIAGNOSIS — Z3A35 35 weeks gestation of pregnancy: Secondary | ICD-10-CM | POA: Diagnosis not present

## 2017-10-17 DIAGNOSIS — Z1389 Encounter for screening for other disorder: Secondary | ICD-10-CM

## 2017-10-17 DIAGNOSIS — O9989 Other specified diseases and conditions complicating pregnancy, childbirth and the puerperium: Secondary | ICD-10-CM | POA: Diagnosis not present

## 2017-10-17 DIAGNOSIS — Z3483 Encounter for supervision of other normal pregnancy, third trimester: Secondary | ICD-10-CM

## 2017-10-17 LAB — POCT URINALYSIS DIPSTICK
Blood, UA: NEGATIVE
Glucose, UA: NEGATIVE
Ketones, UA: NEGATIVE
LEUKOCYTES UA: NEGATIVE
NITRITE UA: NEGATIVE

## 2017-10-17 MED ORDER — CYCLOBENZAPRINE HCL 10 MG PO TABS: 10.0000 mg | ORAL_TABLET | Freq: Three times a day (TID) | ORAL | 1 refills | Status: DC | PRN

## 2017-10-17 MED ORDER — BUTALBITAL-APAP-CAFFEINE 50-325-40 MG PO TABS: 1.0000 | ORAL_TABLET | Freq: Four times a day (QID) | ORAL | 0 refills | Status: DC | PRN

## 2017-10-17 NOTE — Patient Instructions (Signed)
EPLEY MANEUVER (youtube videos) if your vertigo returns

## 2017-10-17 NOTE — Progress Notes (Signed)
WORK IN FOR HA/SEEING SPOTS.  Says tylenol doesn' t help. Says movement is decreased today. Was seen both 2 and 3 days ago for the same complaints.  Was dizzy 2 days ago, not dizzy now.  Says spots are seen out of right eye.  Headache is in the back of her head and her neck today.   Vitals:   10/17/17 1125  BP: 122/86  Pulse: 71   122/74  NST: FHR baseline 145 bpm, Variability: moderate, Accelerations:present, Decelerations:  Absent= Cat 1/Reactive.  Baby very very active on EFM, pt is aware of the movements.   Has trigger points on right neck and supraspinous muscles and also one on left shoulder.    Ice/massage/flexeril for HA.  Also given Rx for fioricet if HA is frontal .   F/U as scheduled

## 2017-10-17 NOTE — Telephone Encounter (Signed)
Patient states she is still having headaches-tylenol is not helping. She is also having vision changes. Informed patient we needed to see her so will w/i with Drenda FreezeFran. Verbalized understanding.

## 2017-10-18 ENCOUNTER — Encounter: Payer: Self-pay | Admitting: Obstetrics & Gynecology

## 2017-10-18 ENCOUNTER — Inpatient Hospital Stay (HOSPITAL_COMMUNITY)
Admission: AD | Admit: 2017-10-18 | Discharge: 2017-10-21 | DRG: 807 | Disposition: A | Discharge status: Home / Self Care | Payer: 59 | Source: Ambulatory Visit | Attending: Obstetrics & Gynecology | Admitting: Obstetrics & Gynecology

## 2017-10-18 ENCOUNTER — Encounter (HOSPITAL_COMMUNITY): Payer: Self-pay | Admitting: *Deleted

## 2017-10-18 DIAGNOSIS — O9962 Diseases of the digestive system complicating childbirth: Secondary | ICD-10-CM | POA: Diagnosis present

## 2017-10-18 DIAGNOSIS — O1410 Severe pre-eclampsia, unspecified trimester: Secondary | ICD-10-CM | POA: Diagnosis present

## 2017-10-18 DIAGNOSIS — Z3A35 35 weeks gestation of pregnancy: Secondary | ICD-10-CM

## 2017-10-18 DIAGNOSIS — O9902 Anemia complicating childbirth: Secondary | ICD-10-CM | POA: Diagnosis present

## 2017-10-18 DIAGNOSIS — K219 Gastro-esophageal reflux disease without esophagitis: Secondary | ICD-10-CM | POA: Diagnosis present

## 2017-10-18 DIAGNOSIS — D649 Anemia, unspecified: Secondary | ICD-10-CM | POA: Diagnosis present

## 2017-10-18 DIAGNOSIS — O1414 Severe pre-eclampsia complicating childbirth: Secondary | ICD-10-CM | POA: Diagnosis not present

## 2017-10-18 DIAGNOSIS — G44201 Tension-type headache, unspecified, intractable: Secondary | ICD-10-CM

## 2017-10-18 DIAGNOSIS — O1413 Severe pre-eclampsia, third trimester: Secondary | ICD-10-CM | POA: Diagnosis present

## 2017-10-18 LAB — COMPREHENSIVE METABOLIC PANEL
ALBUMIN: 2.6 g/dL — AB (ref 3.5–5.0)
ALT: 25 U/L (ref 14–54)
AST: 57 U/L — ABNORMAL HIGH (ref 15–41)
Alkaline Phosphatase: 219 U/L — ABNORMAL HIGH (ref 38–126)
Anion gap: 9 (ref 5–15)
BUN: 15 mg/dL (ref 6–20)
CHLORIDE: 106 mmol/L (ref 101–111)
CO2: 19 mmol/L — AB (ref 22–32)
CREATININE: 0.79 mg/dL (ref 0.44–1.00)
Calcium: 7.7 mg/dL — ABNORMAL LOW (ref 8.9–10.3)
GFR calc Af Amer: >60 mL/min (ref >60)
GLUCOSE: 74 mg/dL (ref 65–99)
POTASSIUM: 4 mmol/L (ref 3.5–5.1)
Sodium: 134 mmol/L — ABNORMAL LOW (ref 135–145)
Total Bilirubin: 0.6 mg/dL (ref 0.3–1.2)
Total Protein: 6.4 g/dL — ABNORMAL LOW (ref 6.5–8.1)

## 2017-10-18 LAB — CBC
HEMATOCRIT: 29.5 % — AB (ref 36.0–46.0)
Hemoglobin: 9.3 g/dL — ABNORMAL LOW (ref 12.0–15.0)
MCH: 24.1 pg — ABNORMAL LOW (ref 26.0–34.0)
MCHC: 31.5 g/dL (ref 30.0–36.0)
MCV: 76.4 fL — AB (ref 78.0–100.0)
PLATELETS: 316 10*3/uL (ref 150–400)
RBC: 3.86 MIL/uL — AB (ref 3.87–5.11)
RDW: 15.9 % — ABNORMAL HIGH (ref 11.5–15.5)
WBC: 15.8 10*3/uL — ABNORMAL HIGH (ref 4.0–10.5)

## 2017-10-18 LAB — GROUP B STREP BY PCR: GROUP B STREP BY PCR: NEGATIVE

## 2017-10-18 LAB — TYPE AND SCREEN
ABO/RH(D): O POS
Antibody Screen: NEGATIVE

## 2017-10-18 LAB — OB RESULTS CONSOLE GBS: STREP GROUP B AG: NEGATIVE

## 2017-10-18 LAB — URINALYSIS, ROUTINE W REFLEX MICROSCOPIC
Bilirubin Urine: NEGATIVE
GLUCOSE, UA: NEGATIVE mg/dL
HGB URINE DIPSTICK: NEGATIVE
Ketones, ur: NEGATIVE mg/dL
Nitrite: NEGATIVE
Protein, ur: 100 mg/dL — AB
SPECIFIC GRAVITY, URINE: 1.019 (ref 1.005–1.030)
pH: 7 (ref 5.0–8.0)

## 2017-10-18 LAB — PROTEIN / CREATININE RATIO, URINE
CREATININE, URINE: 158 mg/dL
PROTEIN CREATININE RATIO: 0.76 mg/mg{creat} — AB (ref 0.00–0.15)
Total Protein, Urine: 120 mg/dL

## 2017-10-18 MED ORDER — HYDRALAZINE HCL 20 MG/ML IJ SOLN: 10.0000 mg | Freq: Once | INTRAMUSCULAR | Status: DC | PRN

## 2017-10-18 MED ORDER — DEXAMETHASONE SODIUM PHOSPHATE 10 MG/ML IJ SOLN
10.0000 mg | INTRAMUSCULAR | Status: AC
  Administered 2017-10-18: 10 mg via INTRAVENOUS
  Filled 2017-10-18: qty 1

## 2017-10-18 MED ORDER — FENTANYL CITRATE (PF) 100 MCG/2ML IJ SOLN
100.0000 ug | INTRAMUSCULAR | Status: DC | PRN
  Administered 2017-10-18 (×2): 100 ug via INTRAVENOUS
  Filled 2017-10-18 (×2): qty 2

## 2017-10-18 MED ORDER — DIPHENHYDRAMINE HCL 50 MG/ML IJ SOLN: 12.5000 mg | INTRAMUSCULAR | Status: DC | PRN

## 2017-10-18 MED ORDER — LIDOCAINE HCL (PF) 1 % IJ SOLN
30.0000 mL | INTRAMUSCULAR | Status: DC | PRN
  Filled 2017-10-18: qty 30

## 2017-10-18 MED ORDER — MAGNESIUM SULFATE BOLUS VIA INFUSION
4.0000 g | Freq: Once | INTRAVENOUS | Status: AC
Start: 2017-10-18 — End: 2017-10-18
  Administered 2017-10-18: 4 g via INTRAVENOUS
  Filled 2017-10-18: qty 500

## 2017-10-18 MED ORDER — DIPHENHYDRAMINE HCL 50 MG/ML IJ SOLN
25.0000 mg | INTRAMUSCULAR | Status: AC
  Administered 2017-10-18: 25 mg via INTRAVENOUS
  Filled 2017-10-18: qty 1

## 2017-10-18 MED ORDER — ACETAMINOPHEN 500 MG PO TABS
1000.0000 mg | ORAL_TABLET | Freq: Once | ORAL | Status: AC
  Administered 2017-10-18: 1000 mg via ORAL
  Filled 2017-10-18: qty 2

## 2017-10-18 MED ORDER — ACETAMINOPHEN 325 MG PO TABS
650.0000 mg | ORAL_TABLET | ORAL | Status: DC | PRN
  Administered 2017-10-18: 650 mg via ORAL
  Filled 2017-10-18 (×2): qty 2

## 2017-10-18 MED ORDER — LACTATED RINGERS IV SOLN
INTRAVENOUS | Status: DC
  Administered 2017-10-18 (×3): via INTRAVENOUS

## 2017-10-18 MED ORDER — MAGNESIUM SULFATE 40 G IN LACTATED RINGERS - SIMPLE
2.0000 g/h | INTRAVENOUS | Status: DC
  Administered 2017-10-19: 2 g/h via INTRAVENOUS
  Filled 2017-10-18: qty 40
  Filled 2017-10-18: qty 500

## 2017-10-18 MED ORDER — OXYCODONE-ACETAMINOPHEN 5-325 MG PO TABS: 1.0000 | ORAL_TABLET | ORAL | Status: DC | PRN

## 2017-10-18 MED ORDER — MISOPROSTOL 25 MCG QUARTER TABLET
25.0000 ug | ORAL_TABLET | ORAL | Status: DC | PRN
  Administered 2017-10-18: 25 ug via VAGINAL
  Filled 2017-10-18 (×2): qty 1

## 2017-10-18 MED ORDER — MISOPROSTOL 50MCG HALF TABLET
50.0000 ug | ORAL_TABLET | ORAL | Status: DC
  Administered 2017-10-18 (×2): 50 ug via ORAL
  Filled 2017-10-18 (×3): qty 1

## 2017-10-18 MED ORDER — EPHEDRINE 5 MG/ML INJ
10.0000 mg | INTRAVENOUS | Status: DC | PRN
  Filled 2017-10-18: qty 2

## 2017-10-18 MED ORDER — ONDANSETRON HCL 4 MG/2ML IJ SOLN: 4.0000 mg | Freq: Four times a day (QID) | INTRAMUSCULAR | Status: DC | PRN

## 2017-10-18 MED ORDER — OXYTOCIN 40 UNITS IN LACTATED RINGERS INFUSION - SIMPLE MED
2.5000 [IU]/h | INTRAVENOUS | Status: DC
  Filled 2017-10-18: qty 1000

## 2017-10-18 MED ORDER — LABETALOL HCL 5 MG/ML IV SOLN
20.0000 mg | INTRAVENOUS | Status: DC | PRN
  Administered 2017-10-18: 20 mg via INTRAVENOUS
  Administered 2017-10-18: 40 mg via INTRAVENOUS
  Filled 2017-10-18: qty 8
  Filled 2017-10-18: qty 4

## 2017-10-18 MED ORDER — LACTATED RINGERS IV SOLN: 500.0000 mL | Freq: Once | INTRAVENOUS | Status: DC

## 2017-10-18 MED ORDER — OXYCODONE-ACETAMINOPHEN 5-325 MG PO TABS: 2.0000 | ORAL_TABLET | ORAL | Status: DC | PRN

## 2017-10-18 MED ORDER — PHENYLEPHRINE 40 MCG/ML (10ML) SYRINGE FOR IV PUSH (FOR BLOOD PRESSURE SUPPORT)
80.0000 ug | PREFILLED_SYRINGE | INTRAVENOUS | Status: DC | PRN
  Filled 2017-10-18: qty 5

## 2017-10-18 MED ORDER — LACTATED RINGERS IV SOLN: 500.0000 mL | INTRAVENOUS | Status: DC | PRN

## 2017-10-18 MED ORDER — OXYTOCIN BOLUS FROM INFUSION: 500.0000 mL | Freq: Once | INTRAVENOUS | Status: DC

## 2017-10-18 MED ORDER — METOCLOPRAMIDE HCL 5 MG/ML IJ SOLN
10.0000 mg | Freq: Once | INTRAMUSCULAR | Status: AC
  Administered 2017-10-18: 10 mg via INTRAVENOUS
  Filled 2017-10-18: qty 2

## 2017-10-18 MED ORDER — PHENYLEPHRINE 40 MCG/ML (10ML) SYRINGE FOR IV PUSH (FOR BLOOD PRESSURE SUPPORT)
80.0000 ug | PREFILLED_SYRINGE | INTRAVENOUS | Status: DC | PRN
  Filled 2017-10-18: qty 10
  Filled 2017-10-18: qty 5

## 2017-10-18 MED ORDER — TERBUTALINE SULFATE 1 MG/ML IJ SOLN: 0.2500 mg | Freq: Once | INTRAMUSCULAR | Status: DC | PRN

## 2017-10-18 MED ORDER — FENTANYL 2.5 MCG/ML BUPIVACAINE 1/10 % EPIDURAL INFUSION (WH - ANES)
14.0000 mL/h | INTRAMUSCULAR | Status: DC | PRN
  Administered 2017-10-19: 14 mL/h via EPIDURAL
  Filled 2017-10-18: qty 100

## 2017-10-18 MED ORDER — OXYTOCIN BOLUS FROM INFUSION
500.0000 mL | Freq: Once | INTRAVENOUS | Status: AC
  Administered 2017-10-19: 500 mL via INTRAVENOUS

## 2017-10-18 MED ORDER — OXYTOCIN 40 UNITS IN LACTATED RINGERS INFUSION - SIMPLE MED: 2.5000 [IU]/h | INTRAVENOUS | Status: DC

## 2017-10-18 MED ORDER — BETAMETHASONE SOD PHOS & ACET 6 (3-3) MG/ML IJ SUSP
12.0000 mg | Freq: Once | INTRAMUSCULAR | Status: AC
  Administered 2017-10-18: 12 mg via INTRAMUSCULAR
  Filled 2017-10-18: qty 2

## 2017-10-18 MED ORDER — LIDOCAINE HCL (PF) 1 % IJ SOLN: 30.0000 mL | INTRAMUSCULAR | Status: DC | PRN

## 2017-10-18 MED ORDER — LACTATED RINGERS IV SOLN: INTRAVENOUS | Status: DC

## 2017-10-18 MED ORDER — SOD CITRATE-CITRIC ACID 500-334 MG/5ML PO SOLN: 30.0000 mL | ORAL | Status: DC | PRN

## 2017-10-18 NOTE — Progress Notes (Signed)
1730 spoke to RDawson,CNM regarding plan of care for induction when pt is due for cytotec at 1745. CNM states to call her at 1745 and she would examine pt and determine plan of care and attempt foley bulb. At 1750, in department to call provider for SVE and informed providers are in another room attending a delivery.

## 2017-10-18 NOTE — MAU Provider Note (Signed)
History     CSN: [id number]  Arrival date and time: 10/18/17 9528   First Provider Initiated Contact with Patient 10/18/17 1049      Chief Complaint  Patient presents with  . Hypertension  . Headache  . Chest Pain   HPI   [name] is a 27 y.o. female G3P1011 @ 36w5dhere in MAU with scotoma, headache and neck pain. The headache started this morning at 0600. She has not taken anything for the pain. The HA has been there off and on for a few days, however this morning the pain became severe. Has a history of migraines, but nothing like this. Says she has had the scotoma for a few days as well. She was seen here in MAU on 3/12 for similar symptoms.   OB History    Gravida Para Term Preterm AB Living   '3 1 1   1 1   ' SAB TAB Ectopic Multiple Live Births   1     0 1      Past Medical History:  Diagnosis Date  . Anemia   . Bleeding in early pregnancy 12/19/2015  . Contraceptive management 09/26/2015  . Depression    post-partum depression  . Encounter for Nexplanon removal 09/26/2015  . Headache    migraine  . Miscarriage 12/21/2015    Past Surgical History:  Procedure Laterality Date  . NO PAST SURGERIES      Family History  Problem Relation Age of Onset  . Cancer Maternal Grandfather        skin  . Pyloric stenosis Son   . Irritable bowel syndrome Paternal Aunt   . Crohn's disease Paternal Aunt   . Heart disease Maternal Aunt   . Heart disease Maternal Uncle     Social History   Tobacco Use  . Smoking status: Never Smoker  . Smokeless tobacco: Never Used  Substance Use Topics  . Alcohol use: No  . Drug use: No    Allergies: No Known Allergies  Medications Prior to Admission  Medication Sig Dispense Refill Last Dose  . acetaminophen (TYLENOL) 500 MG tablet Take 1,000 mg by mouth every 6 (six) hours as needed for mild pain.   10/17/2017 at Unknown time  . butalbital-acetaminophen-caffeine (FIORICET, ESGIC) 50-325-40 MG tablet Take 1 tablet by  mouth every 6 (six) hours as needed for headache. 20 tablet 0 10/17/2017 at Unknown time  . cyclobenzaprine (FLEXERIL) 10 MG tablet Take 1 tablet (10 mg total) by mouth every 8 (eight) hours as needed for muscle spasms. 30 tablet 1 10/17/2017 at Unknown time  . omeprazole (PRILOSEC) 20 MG capsule Take 1 capsule (20 mg total) by mouth 2 (two) times daily before a meal. 60 capsule 0 10/17/2017 at Unknown time  . Prenatal Vit-Fe Fumarate-FA (PRENATAL MULTIVITAMIN) TABS tablet Take 1 tablet by mouth daily at 12 noon.   10/17/2017 at Unknown time   Results for orders placed or performed during the hospital encounter of 10/18/17 (from the past 48 hour(s))  Urinalysis, Routine w reflex microscopic     Status: Abnormal   Collection Time: 10/18/17 10:02 AM  Result Value Ref Range   Color, Urine YELLOW YELLOW   APPearance HAZY (A) CLEAR   Specific Gravity, Urine 1.019 1.005 - 1.030   pH 7.0 5.0 - 8.0   Glucose, UA NEGATIVE NEGATIVE mg/dL   Hgb urine dipstick NEGATIVE NEGATIVE   Bilirubin Urine NEGATIVE NEGATIVE   Ketones, ur NEGATIVE NEGATIVE mg/dL   Protein, ur  100 (A) NEGATIVE mg/dL   Nitrite NEGATIVE NEGATIVE   Leukocytes, UA MODERATE (A) NEGATIVE   RBC / HPF 0-5 0 - 5 RBC/hpf   WBC, UA 6-30 0 - 5 WBC/hpf   Bacteria, UA RARE (A) NONE SEEN   Squamous Epithelial / LPF 6-30 (A) NONE SEEN    Comment: Performed at [facility], [address] [place], [place] [address]  Protein / creatinine ratio, urine     Status: Abnormal   Collection Time: 10/18/17 10:02 AM  Result Value Ref Range   Creatinine, Urine 158.00 mg/dL   Total Protein, Urine 120 mg/dL    Comment: NO NORMAL RANGE ESTABLISHED FOR THIS TEST   Protein Creatinine Ratio 0.76 (H) 0.00 - 0.15 mg/mg[Cre]    Comment: Performed at [facility], [address], [place], [place] [address]  CBC     Status: Abnormal   Collection Time: 10/18/17 10:40 AM  Result Value Ref Range   WBC 15.8 (H) 4.0 - 10.5 K/uL   RBC 3.86 (L) 3.87 - 5.11  MIL/uL   Hemoglobin 9.3 (L) 12.0 - 15.0 g/dL   HCT 29.5 (L) 36.0 - 46.0 %   MCV 76.4 (L) 78.0 - 100.0 fL   MCH 24.1 (L) 26.0 - 34.0 pg   MCHC 31.5 30.0 - 36.0 g/dL   RDW 15.9 (H) 11.5 - 15.5 %   Platelets 316 150 - 400 K/uL    Comment: Performed at [facility], [address] [place], [place] [address]  Comprehensive metabolic panel     Status: Abnormal   Collection Time: 10/18/17 10:40 AM  Result Value Ref Range   Sodium 134 (L) 135 - 145 mmol/L   Potassium 4.0 3.5 - 5.1 mmol/L   Chloride 106 101 - 111 mmol/L   CO2 19 (L) 22 - 32 mmol/L   Glucose, Bld 74 65 - 99 mg/dL   BUN 15 6 - 20 mg/dL   Creatinine, Ser 0.79 0.44 - 1.00 mg/dL   Calcium 7.7 (L) 8.9 - 10.3 mg/dL   Total Protein 6.4 (L) 6.5 - 8.1 g/dL   Albumin 2.6 (L) 3.5 - 5.0 g/dL   AST 57 (H) 15 - 41 U/L   ALT 25 14 - 54 U/L   Alkaline Phosphatase 219 (H) 38 - 126 U/L   Total Bilirubin 0.6 0.3 - 1.2 mg/dL   GFR calc non Af Amer >60 >60 mL/min   GFR calc Af Amer >60 >60 mL/min    Comment: (NOTE) The eGFR has been calculated using the CKD EPI equation. This calculation has not been validated in all clinical situations. eGFR's persistently <60 mL/min signify possible Chronic Kidney Disease.    Anion gap 9 5 - 15    Comment: Performed at [facility], [address], [place], [place] [address]    Review of Systems  Respiratory: Negative for shortness of breath.   Cardiovascular: Positive for chest pain.  Gastrointestinal: Negative for abdominal pain.  Genitourinary: Negative for vaginal bleeding and vaginal discharge.  Neurological: Positive for headaches.   Physical Exam   Blood pressure (!) 153/91, pulse 73, temperature 98.6 F (37 C), temperature source Oral, resp. rate 18, height '5\' 7"'  (1.702 m), weight 213 lb (96.6 kg), last menstrual period 01/30/2017, SpO2 96 %.   Patient Vitals for the past 24 hrs:  BP Temp Temp src Pulse Resp SpO2 Height Weight  10/18/17 1116 (!) 160/97 - - 86 - - - -   10/18/17 1104 (!) 158/93 - - 82 - - - -  10/18/17 1046 (!) 156/95 - - 75 - - - -  10/18/17 1031 (!) 153/91 - - 73 - - - -  10/18/17 1020 (!) 168/97 - - 69 - 96 % - -  10/18/17 1006 (!) 157/100 98.6 F (37 C) Oral 65 18 97 % '5\' 7"'  (1.702 m) 213 lb (96.6 kg)    Physical Exam  Constitutional: She is oriented to person, place, and time. She appears well-developed and well-nourished. No distress.  HENT:  Head: Normocephalic.  Eyes: Pupils are equal, round, and reactive to light.  Cardiovascular: Normal rate.  Respiratory: Effort normal and breath sounds normal.  GI: Soft. She exhibits no distension. There is no tenderness. There is no rebound.  Genitourinary:  Genitourinary Comments: Dilation: 1 Effacement (%): Thick Cervical Position: Posterior Exam by:: Noni Saupe NP  Musculoskeletal: Normal range of motion.  Neurological: She is alert and oriented to person, place, and time. She displays abnormal reflex.  Reflex Scores:      Patellar reflexes are 3+ on the right side and 3+ on the left side. Negative clonus   Skin: Skin is warm. She is not diaphoretic.  Psychiatric: Her behavior is normal.   Fetal Tracing: Baseline: 135 BPM Variability: Moderate  Accelerations: 15x15 Decelerations: None Toco: Occasional   MAU Course  Procedures  None  MDM  PIH labs ordered LR/ IV established Betamethasone given Labetalol protocol established.  Magnesium ordered Discussed admission with Dr. Kennon Rounds and Laury Deep CNM  Assessment and Plan   A:   1. Severe pre-eclampsia in third trimester   2. Acute intractable tension-type headache     P:  Admit to Labor and delivery for induction of labor    Noni Saupe I, NP 10/18/2017 11:39 AM

## 2017-10-18 NOTE — Progress Notes (Signed)
Melanie Price is a 27 y.o. G3P1011 at 3270w5d by 6 week ultrasound admitted for induction of labor due to Preeclampsia with severe features.  Subjective: Pt comfortable, feeling some intermittent cramping.  Family in room for support.  Objective: BP 137/78   Pulse 87   Temp 97.7 F (36.5 C) (Oral)   Resp 18   Ht 5\' 7"  (1.702 m)   Wt 213 lb (96.6 kg)   LMP 01/30/2017 (Approximate)   SpO2 99%   BMI 33.36 kg/m  I/O last 3 completed shifts: In: 1447.9 [P.O.:375; I.V.:1072.9] Out: 150 [Urine:150] Total I/O In: 125 [I.V.:125] Out: 0   FHT:  FHR: 135 bpm, variability: moderate,  accelerations:  Present,  decelerations:  Absent UC:   Rare, mild to palpation SVE:   Dilation: 2 Effacement (%): 50 Station: Ballotable, -3 Exam UX:LKGMby:Melanie Price, CNM  Foley bulb inserted without difficulty, filled to 60 cc by RN.  Pt tolerated well.  Labs: Lab Results  Component Value Date   WBC 15.8 (H) 10/18/2017   HGB 9.3 (L) 10/18/2017   HCT 29.5 (L) 10/18/2017   MCV 76.4 (L) 10/18/2017   PLT 316 10/18/2017    Assessment / Plan: Induction of labor due to preeclampsia FB in place Cytotec PO Q 4 hours  Labor: Progressing normally Preeclampsia:  on magnesium sulfate Fetal Wellbeing:  Category I Pain Control:  Labor support without medications I/D:  GBS neg Anticipated MOD:  NSVD  Sharen CounterLisa Price 10/18/2017, 9:19 PM

## 2017-10-18 NOTE — Progress Notes (Addendum)
Offered pt to speak to MD service for medication for headache. Pt states she does not want anything too strong right now. lights lowered. kpad given to help with neck pain. Tylenol given 30 minutes ago and providers aware of headache. Pt just ate and tolerated pizza and sprite.

## 2017-10-18 NOTE — H&P (Signed)
OBSTETRIC ADMISSION HISTORY AND PHYSICAL  Melanie Price is a 27 y.o. female G26P1011 with IUP at [redacted]w[redacted]d by 6wk [place] presenting for IOL for severe preE. Patient endorsed headache for the past few days uncontrolled with tylenol, also endorsed elevated BP at home for the past few weeks, noticing peripheral and hand edema. Elevated BP documented at MAU visit 3/11 and today. She reports +FMs, No LOF, no VB.  She plans on bottle feeding. She request Nexplanon for birth control. She received her prenatal care at [facility]   Dating: By 6 wk [place] --->  Estimated Date of Delivery: 11/17/17  Sono:   @[redacted]w[redacted]d , CWD, normal anatomy, cephalic presentation, EFW 321g. Anterior placenta. AFI normal.  Prenatal History/Complications: Low risk pregnancy Prior postpartum depression with previous pregnancy, mood stable this pregnancy  Past Medical History: Past Medical History:  Diagnosis Date  . Anemia   . Bleeding in early pregnancy 12/19/2015  . Contraceptive management 09/26/2015  . Depression    post-partum depression  . Encounter for Nexplanon removal 09/26/2015  . Headache    migraine  . Miscarriage 12/21/2015   Past Surgical History: Past Surgical History:  Procedure Laterality Date  . NO PAST SURGERIES     Obstetrical History: OB History    Gravida Para Term Preterm AB Living   3 1 1   1 1    SAB TAB Ectopic Multiple Live Births   1     0 1     Social History: Social History   Socioeconomic History  . Marital status: Married    Spouse name: None  . Number of children: None  . Years of education: None  . Highest education level: None  Social Needs  . Financial resource strain: None  . Food insecurity - worry: None  . Food insecurity - inability: None  . Transportation needs - medical: None  . Transportation needs - non-medical: None  Occupational History  . None  Tobacco Use  . Smoking status: Never Smoker  . Smokeless tobacco: Never Used  Substance and Sexual Activity  . Alcohol  use: No  . Drug use: No  . Sexual activity: Yes    Birth control/protection: None  Other Topics Concern  . None  Social History Narrative  . None   Family History: Family History  Problem Relation Age of Onset  . Cancer Maternal Grandfather        skin  . Pyloric stenosis Son   . Irritable bowel syndrome Paternal Aunt   . Crohn's disease Paternal Aunt   . Heart disease Maternal Aunt   . Heart disease Maternal Uncle    Allergies: No Known Allergies  Medications Prior to Admission  Medication Sig Dispense Refill Last Dose  . acetaminophen (TYLENOL) 500 MG tablet Take 1,000 mg by mouth every 6 (six) hours as needed for mild pain.   10/17/2017 at Unknown time  . butalbital-acetaminophen-caffeine (FIORICET, ESGIC) 50-325-40 MG tablet Take 1 tablet by mouth every 6 (six) hours as needed for headache. 20 tablet 0 10/17/2017 at Unknown time  . cyclobenzaprine (FLEXERIL) 10 MG tablet Take 1 tablet (10 mg total) by mouth every 8 (eight) hours as needed for muscle spasms. 30 tablet 1 10/17/2017 at Unknown time  . omeprazole (PRILOSEC) 20 MG capsule Take 1 capsule (20 mg total) by mouth 2 (two) times daily before a meal. 60 capsule 0 10/17/2017 at Unknown time  . Prenatal Vit-Fe Fumarate-FA (PRENATAL MULTIVITAMIN) TABS tablet Take 1 tablet by mouth daily at 12 noon.  10/17/2017 at Unknown time    Review of Systems   All systems reviewed and negative except as stated in HPI  Blood pressure (!) 149/88, pulse 75, temperature 98.7 F (37.1 C), temperature source Oral, resp. rate 18, height 5\' 7"  (1.702 m), weight 213 lb (96.6 kg), last menstrual period 01/30/2017, SpO2 99 %. General appearance: alert, cooperative and no distress Lungs: clear to auscultation bilaterally Heart: regular rate and rhythm Abdomen: soft, non-tender; bowel sounds normal Extremities: Homans sign is negative, no sign of DVT Presentation: cephalic via bedside US Fetal monitoringBaseline: 135 bpm, Variability: Good {> 6  bpm), Accelerations: Reactive and Decelerations: Absent Uterine activity irregular Dilation: 1 Effacement (%): Thick Exam by:: Venia Carbon NP  Prenatal labs: ABO, Rh: --/--/O POS (08/02 1233) Antibody: Negative (01/16 0949) Rubella:  unknown RPR: Non Reactive (01/16 0949)  HBsAg: Negative (09/11 1049)  HIV: Non Reactive (01/16 0949)  GBS:   unknown 2 hr Glucola normal (80, 101, 101) Genetic screening Negative (Integrated screen, CF) Anatomy US @ [redacted]w[redacted]d, normal anatomy, female fetus  Prenatal Transfer Tool  Maternal Diabetes: No Genetic Screening: Normal Maternal Ultrasounds/Referrals: Normal Fetal Ultrasounds or other Referrals:  None Maternal Substance Abuse:  No Significant Maternal Medications:  None Significant Maternal Lab Results: UPC 0.76, AST 57, CBC wnl  Results for orders placed or performed during the hospital encounter of 10/18/17 (from the past 24 hour(s))  Urinalysis, Routine w reflex microscopic   Collection Time: 10/18/17 10:02 AM  Result Value Ref Range   Color, Urine YELLOW YELLOW   APPearance HAZY (A) CLEAR   Specific Gravity, Urine 1.019 1.005 - 1.030   pH 7.0 5.0 - 8.0   Glucose, UA NEGATIVE NEGATIVE mg/dL   Hgb urine dipstick NEGATIVE NEGATIVE   Bilirubin Urine NEGATIVE NEGATIVE   Ketones, ur NEGATIVE NEGATIVE mg/dL   Protein, ur 161 (A) NEGATIVE mg/dL   Nitrite NEGATIVE NEGATIVE   Leukocytes, UA MODERATE (A) NEGATIVE   RBC / HPF 0-5 0 - 5 RBC/hpf   WBC, UA 6-30 0 - 5 WBC/hpf   Bacteria, UA RARE (A) NONE SEEN   Squamous Epithelial / LPF 6-30 (A) NONE SEEN  Protein / creatinine ratio, urine   Collection Time: 10/18/17 10:02 AM  Result Value Ref Range   Creatinine, Urine 158.00 mg/dL   Total Protein, Urine 120 mg/dL   Protein Creatinine Ratio 0.76 (H) 0.00 - 0.15 mg/mg[Cre]  CBC   Collection Time: 10/18/17 10:40 AM  Result Value Ref Range   WBC 15.8 (H) 4.0 - 10.5 K/uL   RBC 3.86 (L) 3.87 - 5.11 MIL/uL   Hemoglobin 9.3 (L) 12.0 - 15.0  g/dL   HCT 09.6 (L) 04.5 - 40.9 %   MCV 76.4 (L) 78.0 - 100.0 fL   MCH 24.1 (L) 26.0 - 34.0 pg   MCHC 31.5 30.0 - 36.0 g/dL   RDW 81.1 (H) 91.4 - 78.2 %   Platelets 316 150 - 400 K/uL  Comprehensive metabolic panel   Collection Time: 10/18/17 10:40 AM  Result Value Ref Range   Sodium 134 (L) 135 - 145 mmol/L   Potassium 4.0 3.5 - 5.1 mmol/L   Chloride 106 101 - 111 mmol/L   CO2 19 (L) 22 - 32 mmol/L   Glucose, Bld 74 65 - 99 mg/dL   BUN 15 6 - 20 mg/dL   Creatinine, Ser 9.56 0.44 - 1.00 mg/dL   Calcium 7.7 (L) 8.9 - 10.3 mg/dL   Total Protein 6.4 (L) 6.5 - 8.1 g/dL  Albumin 2.6 (L) 3.5 - 5.0 g/dL   AST 57 (H) 15 - 41 U/L   ALT 25 14 - 54 U/L   Alkaline Phosphatase 219 (H) 38 - 126 U/L   Total Bilirubin 0.6 0.3 - 1.2 mg/dL   GFR calc non Af Amer >60 >60 mL/min   GFR calc Af Amer >60 >60 mL/min   Anion gap 9 5 - 15    Patient Active Problem List   Diagnosis Date Noted  . Severe preeclampsia 10/18/2017  . Preeclampsia, severe, third trimester 10/18/2017  . Supervision of normal pregnancy 04/16/2017    Assessment/Plan:  Melanie Price is a 27 y.o. G3P1011 at 8933w5d here for IOL for severe preE.  #Labor: Start induction with cytotec. Can likely place FB at next check. #Pain: Per patient request. #FWB: Cat I #ID:  GBS unknown - obtain rapid GBS PCR #MOF: bottle #MOC: Nexplanon #Circ:  N/A (girl) #preE: severe range BP noted in MAU, start preE protocol with Mag and labetalol.  Ellwood DenseAlison Val Schiavo, DO  10/18/2017, 12:47 PM

## 2017-10-18 NOTE — Anesthesia Pain Management Evaluation Note (Signed)
  CRNA Pain Management Visit Note  Patient: Melanie Price, 27 y.o., female  "Hello I am a member of the anesthesia team at [facility]. We have an anesthesia team available at all times to provide care throughout the hospital, including epidural management and anesthesia for C-section. I don't know your plan for the delivery whether it a natural birth, water birth, IV sedation, nitrous supplementation, doula or epidural, but we want to meet your pain goals."   1.Was your pain managed to your expectations on prior hospitalizations?   Yes   2.What is your expectation for pain management during this hospitalization?     Epidural  3.How can we help you reach that goal? epidural  Record the patient's initial score and the patient's pain goal.   Pain: 1  Pain Goal: 5 [facility] wants you to be able to say your pain was always managed very well.  Melanie Price 10/18/2017

## 2017-10-18 NOTE — MAU Note (Signed)
Pt reports she has been having high b/p's and it was 161/91 today at home. Reports headache, seeing "spots" in her field of vision, chest pain

## 2017-10-18 NOTE — Progress Notes (Signed)
CNM updated. Coming to do foley as soon as able. Orders received to change cytotec method.

## 2017-10-19 ENCOUNTER — Encounter (HOSPITAL_COMMUNITY): Payer: Self-pay | Admitting: Anesthesiology

## 2017-10-19 ENCOUNTER — Inpatient Hospital Stay (HOSPITAL_COMMUNITY): Payer: 59 | Admitting: Anesthesiology

## 2017-10-19 ENCOUNTER — Other Ambulatory Visit: Payer: Self-pay

## 2017-10-19 DIAGNOSIS — O1414 Severe pre-eclampsia complicating childbirth: Secondary | ICD-10-CM

## 2017-10-19 DIAGNOSIS — Z3A35 35 weeks gestation of pregnancy: Secondary | ICD-10-CM

## 2017-10-19 DIAGNOSIS — O1410 Severe pre-eclampsia, unspecified trimester: Secondary | ICD-10-CM

## 2017-10-19 LAB — CBC
HCT: 29.7 % — ABNORMAL LOW (ref 36.0–46.0)
HCT: 27 % — ABNORMAL LOW (ref 36.0–46.0)
HEMOGLOBIN: 9.5 g/dL — AB (ref 12.0–15.0)
HEMOGLOBIN: 8.6 g/dL — AB (ref 12.0–15.0)
MCH: 24.4 pg — AB (ref 26.0–34.0)
MCH: 24.7 pg — ABNORMAL LOW (ref 26.0–34.0)
MCHC: 32 g/dL (ref 30.0–36.0)
MCHC: 31.9 g/dL (ref 30.0–36.0)
MCV: 76.2 fL — ABNORMAL LOW (ref 78.0–100.0)
MCV: 77.6 fL — AB (ref 78.0–100.0)
PLATELETS: 306 10*3/uL (ref 150–400)
Platelets: 289 10*3/uL (ref 150–400)
RBC: 3.9 MIL/uL (ref 3.87–5.11)
RBC: 3.48 MIL/uL — AB (ref 3.87–5.11)
RDW: 16 % — ABNORMAL HIGH (ref 11.5–15.5)
RDW: 16.5 % — ABNORMAL HIGH (ref 11.5–15.5)
WBC: 20.1 10*3/uL — ABNORMAL HIGH (ref 4.0–10.5)
WBC: 21.4 10*3/uL — ABNORMAL HIGH (ref 4.0–10.5)

## 2017-10-19 LAB — COMPREHENSIVE METABOLIC PANEL
ALT: 28 U/L (ref 14–54)
ANION GAP: 8 (ref 5–15)
AST: 65 U/L — ABNORMAL HIGH (ref 15–41)
Albumin: 2.4 g/dL — ABNORMAL LOW (ref 3.5–5.0)
Alkaline Phosphatase: 191 U/L — ABNORMAL HIGH (ref 38–126)
BUN: 15 mg/dL (ref 6–20)
CHLORIDE: 106 mmol/L (ref 101–111)
CO2: 20 mmol/L — ABNORMAL LOW (ref 22–32)
Calcium: 5.9 mg/dL — CL (ref 8.9–10.3)
Creatinine, Ser: 0.83 mg/dL (ref 0.44–1.00)
GFR calc non Af Amer: >60 mL/min (ref >60)
Glucose, Bld: 111 mg/dL — ABNORMAL HIGH (ref 65–99)
POTASSIUM: 4.4 mmol/L (ref 3.5–5.1)
SODIUM: 134 mmol/L — AB (ref 135–145)
Total Bilirubin: 0.4 mg/dL (ref 0.3–1.2)
Total Protein: 6.4 g/dL — ABNORMAL LOW (ref 6.5–8.1)

## 2017-10-19 LAB — RPR: RPR Ser Ql: NONREACTIVE

## 2017-10-19 LAB — MAGNESIUM: Magnesium: 7.1 mg/dL (ref 1.7–2.4)

## 2017-10-19 MED ORDER — PRENATAL MULTIVITAMIN CH
1.0000 | ORAL_TABLET | Freq: Every day | ORAL | Status: DC
  Administered 2017-10-20 – 2017-10-21 (×2): 1 via ORAL
  Filled 2017-10-19 (×2): qty 1

## 2017-10-19 MED ORDER — LACTATED RINGERS IV SOLN
INTRAVENOUS | Status: DC
  Administered 2017-10-19: 10:00:00 via INTRAVENOUS

## 2017-10-19 MED ORDER — SENNOSIDES-DOCUSATE SODIUM 8.6-50 MG PO TABS
2.0000 | ORAL_TABLET | ORAL | Status: DC
  Administered 2017-10-19 – 2017-10-20 (×2): 2 via ORAL
  Filled 2017-10-19 (×2): qty 2

## 2017-10-19 MED ORDER — ACETAMINOPHEN 325 MG PO TABS: 650.0000 mg | ORAL_TABLET | ORAL | Status: DC | PRN

## 2017-10-19 MED ORDER — DIBUCAINE 1 % RE OINT: 1.0000 "application " | TOPICAL_OINTMENT | RECTAL | Status: DC | PRN

## 2017-10-19 MED ORDER — COCONUT OIL OIL: 1.0000 "application " | TOPICAL_OIL | Status: DC | PRN

## 2017-10-19 MED ORDER — TETANUS-DIPHTH-ACELL PERTUSSIS 5-2.5-18.5 LF-MCG/0.5 IM SUSP: 0.5000 mL | Freq: Once | INTRAMUSCULAR | Status: DC

## 2017-10-19 MED ORDER — IBUPROFEN 600 MG PO TABS
600.0000 mg | ORAL_TABLET | Freq: Four times a day (QID) | ORAL | Status: DC
  Administered 2017-10-19 – 2017-10-21 (×9): 600 mg via ORAL
  Filled 2017-10-19 (×9): qty 1

## 2017-10-19 MED ORDER — WITCH HAZEL-GLYCERIN EX PADS: 1.0000 "application " | MEDICATED_PAD | CUTANEOUS | Status: DC | PRN

## 2017-10-19 MED ORDER — ZOLPIDEM TARTRATE 5 MG PO TABS: 5.0000 mg | ORAL_TABLET | Freq: Every evening | ORAL | Status: DC | PRN

## 2017-10-19 MED ORDER — DIPHENHYDRAMINE HCL 25 MG PO CAPS: 25.0000 mg | ORAL_CAPSULE | Freq: Four times a day (QID) | ORAL | Status: DC | PRN

## 2017-10-19 MED ORDER — HYDROXYZINE HCL 25 MG PO TABS
25.0000 mg | ORAL_TABLET | Freq: Four times a day (QID) | ORAL | Status: DC | PRN
  Filled 2017-10-19 (×2): qty 1

## 2017-10-19 MED ORDER — LACTATED RINGERS IV SOLN: 500.0000 mL | Freq: Once | INTRAVENOUS | Status: DC

## 2017-10-19 MED ORDER — OXYTOCIN 40 UNITS IN LACTATED RINGERS INFUSION - SIMPLE MED
1.0000 m[IU]/min | INTRAVENOUS | Status: DC
  Administered 2017-10-19: 2 m[IU]/min via INTRAVENOUS

## 2017-10-19 MED ORDER — LIDOCAINE HCL (PF) 1 % IJ SOLN
INTRAMUSCULAR | Status: DC | PRN
  Administered 2017-10-19 (×2): 4 mL via EPIDURAL

## 2017-10-19 MED ORDER — TERBUTALINE SULFATE 1 MG/ML IJ SOLN: 0.2500 mg | Freq: Once | INTRAMUSCULAR | Status: DC | PRN

## 2017-10-19 MED ORDER — ONDANSETRON HCL 4 MG PO TABS: 4.0000 mg | ORAL_TABLET | ORAL | Status: DC | PRN

## 2017-10-19 MED ORDER — SIMETHICONE 80 MG PO CHEW
80.0000 mg | CHEWABLE_TABLET | ORAL | Status: DC | PRN
Start: 2017-10-19 — End: 2017-10-21

## 2017-10-19 MED ORDER — MAGNESIUM SULFATE 40 G IN LACTATED RINGERS - SIMPLE
1.0000 g/h | INTRAVENOUS | Status: AC
  Filled 2017-10-19: qty 500

## 2017-10-19 MED ORDER — BENZOCAINE-MENTHOL 20-0.5 % EX AERO
1.0000 "application " | INHALATION_SPRAY | CUTANEOUS | Status: DC | PRN
  Filled 2017-10-19: qty 56

## 2017-10-19 MED ORDER — ONDANSETRON HCL 4 MG/2ML IJ SOLN: 4.0000 mg | INTRAMUSCULAR | Status: DC | PRN

## 2017-10-19 NOTE — Progress Notes (Signed)
Walked into room to assess patient and baby was wrapped in blankets and being held by the grandmother.  I asked the patient why she took the baby off skin to skin and her FOB stated that she had to take some medicine.  The patient had taken her own Tylenol for a headache. I discussed with the patient and FOB that we do not want her to take her own medications while she is in the hospital. Patient and FOB stated understanding.

## 2017-10-19 NOTE — Progress Notes (Signed)
Results for Melanie Price, Melanie C (MRN [id number]) as of 10/19/2017 19:00  Ref. Range 10/19/2017 16:48  Magnesium Latest Ref Range: 1.7 - 2.4 mg/dL 7.1 (HH)  Dr. Debroah LoopArnold notified of above lab values.  Orders received to continue Mag. At 1gm.

## 2017-10-19 NOTE — Anesthesia Procedure Notes (Signed)
Epidural Patient location during procedure: OB Start time: 10/19/2017 3:20 AM  Staffing Anesthesiologist: Mal AmabileFoster, Martavious Hartel, MD Performed: anesthesiologist   Preanesthetic Checklist Completed: patient identified, site marked, surgical consent, pre-op evaluation, timeout performed, IV checked, risks and benefits discussed and monitors and equipment checked  Epidural Patient position: sitting Prep: site prepped and draped and DuraPrep Patient monitoring: continuous pulse ox and blood pressure Approach: midline Location: L3-L4 Injection technique: LOR air  Needle:  Needle type: Tuohy  Needle gauge: 17 G Needle length: 9 cm and 9 Needle insertion depth: 5 cm cm Catheter type: closed end flexible Catheter size: 19 Gauge Catheter at skin depth: 10 cm Test dose: negative and Other  Assessment Events: blood not aspirated, injection not painful, no injection resistance, negative IV test and no paresthesia  Additional Notes Patient identified. Risks and benefits discussed including failed block, incomplete  Pain control, post dural puncture headache, nerve damage, paralysis, blood pressure Changes, nausea, vomiting, reactions to medications-both toxic and allergic and post Partum back pain. All questions were answered. Patient expressed understanding and wished to proceed. Sterile technique was used throughout procedure. Epidural site was Dressed with sterile barrier dressing. No paresthesias, signs of intravascular injection Or signs of intrathecal spread were encountered.  Patient was more comfortable after the epidural was dosed. Please see RN's note for documentation of vital signs and FHR which are stable.

## 2017-10-19 NOTE — Anesthesia Postprocedure Evaluation (Signed)
Anesthesia Post Note  Patient: Melanie Price  Procedure(s) Performed: AN AD HOC LABOR EPIDURAL     Patient location during evaluation: Mother Baby Anesthesia Type: Epidural Level of consciousness: awake and alert Pain management: pain level controlled Vital Signs Assessment: post-procedure vital signs reviewed and stable Respiratory status: spontaneous breathing, nonlabored ventilation and respiratory function stable Cardiovascular status: stable Postop Assessment: no headache, no backache and epidural receding Anesthetic complications: no    Last Vitals:  Vitals:   10/19/17 0945 10/19/17 1045  BP: 109/61 122/78  Pulse: 91 99  Resp: 18 20  Temp: 36.8 C 36.8 C  SpO2: 98% 100%    Last Pain:  Vitals:   10/19/17 1100  TempSrc:   PainSc: 0-No pain   Pain Goal:                 Deovion Batrez N

## 2017-10-19 NOTE — Anesthesia Preprocedure Evaluation (Addendum)
Anesthesia Evaluation  Patient identified by MRN, date of birth, ID band Patient awake    Reviewed: Allergy & Precautions, Patient's Chart, lab work & pertinent test results  Airway Mallampati: II  TM Distance: >3 FB Neck ROM: Full    Dental no notable dental hx. (+) Teeth Intact   Pulmonary neg pulmonary ROS,    Pulmonary exam normal breath sounds clear to auscultation       Cardiovascular hypertension, negative cardio ROS Normal cardiovascular exam Rhythm:Regular Rate:Normal     Neuro/Psych  Headaches, PSYCHIATRIC DISORDERS Depression    GI/Hepatic Neg liver ROS, GERD  Medicated and Controlled,  Endo/Other  Obesity  Renal/GU negative Renal ROS  negative genitourinary   Musculoskeletal negative musculoskeletal ROS (+)   Abdominal (+) + obese,   Peds  Hematology  (+) anemia ,   Anesthesia Other Findings   Reproductive/Obstetrics (+) Pregnancy                            Anesthesia Physical Anesthesia Plan  ASA: II  Anesthesia Plan: Epidural   Post-op Pain Management:    Induction:   PONV Risk Score and Plan:   Airway Management Planned: Natural Airway  Additional Equipment:   Intra-op Plan:   Post-operative Plan:   Informed Consent: I have reviewed the patients History and Physical, chart, labs and discussed the procedure including the risks, benefits and alternatives for the proposed anesthesia with the patient or authorized representative who has indicated his/her understanding and acceptance.     Plan Discussed with: Anesthesiologist  Anesthesia Plan Comments:         Anesthesia Quick Evaluation

## 2017-10-19 NOTE — Progress Notes (Signed)
Spoke to YahooLisa Leftwich Kirby and let her know of patient's request to turn off her mag due to "It's making me feel bad and I won't feel better until I am off of it". Misty StanleyLisa states that she will let the MD know during report this AM.

## 2017-10-20 MED ORDER — CALCIUM CARBONATE ANTACID 500 MG PO CHEW
1.0000 | CHEWABLE_TABLET | Freq: Three times a day (TID) | ORAL | Status: DC
  Administered 2017-10-20 – 2017-10-21 (×3): 200 mg via ORAL
  Filled 2017-10-20 (×3): qty 1

## 2017-10-20 MED ORDER — FERROUS SULFATE 325 (65 FE) MG PO TABS
325.0000 mg | ORAL_TABLET | Freq: Two times a day (BID) | ORAL | Status: DC
  Administered 2017-10-20 – 2017-10-21 (×2): 325 mg via ORAL
  Filled 2017-10-20 (×2): qty 1

## 2017-10-20 NOTE — Clinical Social Work Maternal (Signed)
CLINICAL SOCIAL WORK MATERNAL/CHILD NOTE  Patient Details  Name: Melanie Price MRN: [id number] Date of Birth: 2016-09-17  Date:  2018/06/30  Clinical Social Worker Initiating Note:  Melanie Price, MSW, LCSW-A Date/Time: Initiated:  10/20/17/1043     Child's Name:  Melanie Price   Biological Parents:  Father, Mother   Need for Interpreter:  None   Reason for Referral:  Behavioral Health Concerns   Address:  [address] [place] [address]    Phone number:  [phone] (home)     Additional phone number:   Household Members/Support Persons (HM/SP):   Household Member/Support Person 1   HM/SP Name Relationship DOB or Age  HM/SP -1 Melanie Price Husband    HM/SP -2        HM/SP -3        HM/SP -4        HM/SP -5        HM/SP -6        HM/SP -7        HM/SP -8          Natural Supports (not living in the home):  Friends, Immediate Family   Professional Supports: None   Employment: Unemployed, Husband works at [employer] and [employer]  Type of Work:  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   Pediatrician:    [name]  Pediatrician List:   [name])  [name]      Pediatrician Fax Number:    Risk Factors/Current Problems:  None   Cognitive State:  Alert , Goal Oriented , Able to Concentrate    Mood/Affect:  Comfortable , Happy , Interested    CSW Assessment: CSW met with patient, her husband  Melanie Price, and newborn at bedside. Couple has one older child, Melanie Price. (DOB 06/17/14) that resides in the home with them. Patient was admitted for preeclampsia and infant was born at [redacted] weeks gestation. Parents stated that the newborn will sleep in a crib inside their bedroom, [name] educated parents on safe sleep and reduction of SIDS. Parents report having a brand new car seat for safe transport of infant, CSW provided general guidelines for car seat safety. Mother of child will bottle feed newborn and receives [facility]. Patient stated nobody in the home smokes tobacco, CSW educated on smoke exposure risks. CSW inquired about patient's mental health and she stated that she has had on going anxiety for years and that she does not think medications are necessary. Patient received one dose of vistiril for anxiety soon after delivery, per the patient "my step-mom Melanie Price asked the nurse to give me that." Mother of child stated that she has a counselor, Melanie Price of [place] to follow up with if any concerns arise. Parents report great support from their immediate and extended families and that they have all basic necessities for caring for newborn. CSW educated parents on baby blues period versus postpartum anxiety and depression. Parents  did not have any further questions for CSW CSW encouraged parents to reach out for assistance from CSW department after discharge if needed, expressed agreement.  CSW Plan/Description:  No Further Intervention Required/No Barriers to Discharge, Psychosocial Support and Ongoing Assessment of Needs, Sudden Infant Death Syndrome (SIDS) Education, Perinatal Mood and Anxiety Disorder (PMADs) Education    Melanie Price L Melanie Price, LCSWA 10/20/2017, 10:47 AM  

## 2017-10-20 NOTE — Progress Notes (Signed)
POSTPARTUM PROGRESS NOTE  Post Partum Day 1 Subjective:  Melanie Price is a 27 y.o. G3P1011 6332w0d s/p SVD and IOL for preeclampsia with severe features. Preeclampsia stopped infusing at 0700 this morning. Patient is feeling well, asymptomatic. Pain is well controlled.  Objective: Blood pressure 124/85, pulse 74, temperature 97.9 F (36.6 C), temperature source Oral, resp. rate 16, height 5\' 7"  (1.702 m), weight 96.6 kg (213 lb), last menstrual period 01/30/2017, SpO2 96 %.  Physical Exam:  General: alert, cooperative and no distress Lochia:normal flow Chest: no respiratory distress Heart:regular rate, distal pulses intact Abdomen: soft, nontender,  Uterine Fundus: firm, appropriately tender DVT Evaluation: No calf swelling or tenderness Extremities: no edema  Recent Labs    10/19/17 0249 10/19/17 1648  HGB 9.5* 8.6*  HCT 29.7* 27.0*    Assessment/Plan:  ASSESSMENT: Melanie Price is a 27 y.o. G3P1011 7432w0d s/p SVD with preeclampsia with severe features and magnesium infusion.  Patient is doing well. Start iron for anemia. Continue to monitor blood pressure. No oral meds at this time.   LOS: 2 days   Nathali Vent MossDO 10/20/2017, 9:32 AM

## 2017-10-21 ENCOUNTER — Telehealth: Payer: Self-pay | Admitting: *Deleted

## 2017-10-21 LAB — RUBELLA SCREEN: RUBELLA: 1.03 {index} (ref >0.99)

## 2017-10-21 MED ORDER — IBUPROFEN 600 MG PO TABS: 600.0000 mg | ORAL_TABLET | Freq: Four times a day (QID) | ORAL | 0 refills | Status: DC

## 2017-10-21 MED ORDER — FERROUS SULFATE 325 (65 FE) MG PO TABS: 325.0000 mg | ORAL_TABLET | Freq: Two times a day (BID) | ORAL | 3 refills | Status: DC

## 2017-10-21 MED ORDER — HYDROCHLOROTHIAZIDE 25 MG PO TABS: 25.0000 mg | ORAL_TABLET | Freq: Every day | ORAL | 3 refills | Status: DC

## 2017-10-21 NOTE — Discharge Instructions (Signed)
Postpartum Care After Vaginal Delivery °The period of time right after you deliver your newborn is called the postpartum period. °What kind of medical care will I receive? °· You may continue to receive fluids and medicines through an IV tube inserted into one of your veins. °· If an incision was made near your vagina (episiotomy) or if you had some vaginal tearing during delivery, cold compresses may be placed on your episiotomy or your tear. This helps to reduce pain and swelling. °· You may be given a squirt bottle to use when you go to the bathroom. You may use this until you are comfortable wiping as usual. To use the squirt bottle, follow these steps: °? Before you urinate, fill the squirt bottle with warm water. Do not use hot water. °? After you urinate, while you are sitting on the toilet, use the squirt bottle to rinse the area around your urethra and vaginal opening. This rinses away any urine and blood. °? You may do this instead of wiping. As you start healing, you may use the squirt bottle before wiping yourself. Make sure to wipe gently. °? Fill the squirt bottle with clean water every time you use the bathroom. °· You will be given sanitary pads to wear. °How can I expect to feel? °· You may not feel the need to urinate for several hours after delivery. °· You will have some soreness and pain in your abdomen and vagina. °· If you are breastfeeding, you may have uterine contractions every time you breastfeed for up to several weeks postpartum. Uterine contractions help your uterus return to its normal size. °· It is normal to have vaginal bleeding (lochia) after delivery. The amount and appearance of lochia is often similar to a menstrual period in the first week after delivery. It will gradually decrease over the next few weeks to a dry, yellow-brown discharge. For most women, lochia stops completely by 6-8 weeks after delivery. Vaginal bleeding can vary from woman to woman. °· Within the first few  days after delivery, you may have breast engorgement. This is when your breasts feel heavy, full, and uncomfortable. Your breasts may also throb and feel hard, tightly stretched, warm, and tender. After this occurs, you may have milk leaking from your breasts. Your health care provider can help you relieve discomfort due to breast engorgement. Breast engorgement should go away within a few days. °· You may feel more sad or worried than normal due to hormonal changes after delivery. These feelings should not last more than a few days. If these feelings do not go away after several days, speak with your health care provider. °How should I care for myself? °· Tell your health care provider if you have pain or discomfort. °· Drink enough water to keep your urine clear or pale yellow. °· Wash your hands thoroughly with soap and water for at least 20 seconds after changing your sanitary pads, after using the toilet, and before holding or feeding your baby. °· If you are not breastfeeding, avoid touching your breasts a lot. Doing this can make your breasts produce more milk. °· If you become weak or lightheaded, or you feel like you might faint, ask for help before: °? Getting out of bed. °? Showering. °· Change your sanitary pads frequently. Watch for any changes in your flow, such as a sudden increase in volume, a change in color, the passing of large blood clots. If you pass a blood clot from your vagina, save it   to show to your health care provider. Do not flush blood clots down the toilet without having your health care provider look at them. °· Make sure that all your vaccinations are up to date. This can help protect you and your baby from getting certain diseases. You may need to have immunizations done before you leave the hospital. °· If desired, talk with your health care provider about methods of family planning or birth control (contraception). °How can I start bonding with my baby? °Spending as much time as  possible with your baby is very important. During this time, you and your baby can get to know each other and develop a bond. Having your baby stay with you in your room (rooming in) can give you time to get to know your baby. Rooming in can also help you become comfortable caring for your baby. Breastfeeding can also help you bond with your baby. °How can I plan for returning home with my baby? °· Make sure that you have a car seat installed in your vehicle. °? Your car seat should be checked by a certified car seat installer to make sure that it is installed safely. °? Make sure that your baby fits into the car seat safely. °· Ask your health care provider any questions you have about caring for yourself or your baby. Make sure that you are able to contact your health care provider with any questions after leaving the hospital. °This information is not intended to replace advice given to you by your health care provider. Make sure you discuss any questions you have with your health care provider. °Document Released: 05/20/2007 Document Revised: 12/26/2015 Document Reviewed: 06/27/2015 °Elsevier Interactive Patient Education © 2018 Elsevier Inc. ° °

## 2017-10-21 NOTE — Progress Notes (Signed)
Pt ambulated out teaching complete  

## 2017-10-21 NOTE — Discharge Summary (Signed)
OB Discharge Summary     Patient Name: Melanie Price DOB: 03-Sep-1990 MRN: [id number]  Date of admission: 10/18/2017 Delivering MD: Sharen Counter A   Date of discharge: 10/21/2017  Admitting diagnosis: 36WKS, HIGH BP,DIZZY,HA,CHEST PAIN Intrauterine pregnancy: [redacted]w[redacted]d     Secondary diagnosis:  Active Problems:   Severe preeclampsia   Preeclampsia, severe, third trimester   SVD (spontaneous vaginal delivery)      Discharge diagnosis: Preterm Pregnancy Delivered and Preeclampsia (severe)                                                                                                Post partum procedures: magnesium sulfate for seizure prophylaxis  Hospital course:  Induction of Labor With Vaginal Delivery   27 y.o. yo G3P1011 at [redacted]w[redacted]d was admitted to the hospital 10/18/2017 for induction of labor.  Indication for induction: Preeclampsia.  Patient had an uncomplicated labor course as follows: Membrane Rupture Time/Date: 6:24 AM ,10/19/2017   Intrapartum Procedures: Episiotomy: None [1]                                         Lacerations:  None [1]  Patient had delivery of a Viable infant.  Information for the patient's newborn:  Kamyiah, Colantonio [[id number]]  Delivery Method: Vag-Spont   10/19/2017  Details of delivery can be found in separate delivery note.  Patient had a routine postpartum course. Patient is discharged home 10/21/17.  Patient was admitted for induction of labor secondary to severe preeclampsia. She received magnesium sulfate during her induction of labor and for 24 hours following delivery. She remained asymptomatic and her blood pressure improved greatly. She was discharged home on HCTZ 25 mg with plans to follow up in the office for BP check later this week  Physical exam  Vitals:   10/20/17 2007 10/20/17 2345 10/21/17 0400 10/21/17 0809  BP: 136/79 127/68 (!) 141/92 137/73  Pulse: 86 71 71 62  Resp: 18 18 18 16   Temp: 97.9 F (36.6 C) 98.1 F (36.7 C)  98.7 F (37.1 C) 98.4 F (36.9 C)  TempSrc: Oral Oral Oral Oral  SpO2: 99% 100% 100% 99%  Weight:      Height:       General: alert, cooperative and no distress Lochia: appropriate Uterine Fundus: firm DVT Evaluation: No evidence of DVT seen on physical exam. No cords or calf tenderness. Labs: Lab Results  Component Value Date   WBC 21.4 (H) 10/19/2017   HGB 8.6 (L) 10/19/2017   HCT 27.0 (L) 10/19/2017   MCV 77.6 (L) 10/19/2017   PLT 289 10/19/2017   CMP Latest Ref Rng & Units 10/19/2017  Glucose 65 - 99 mg/dL 782(N)  BUN 6 - 20 mg/dL 15  Creatinine 5.62 - 1.30 mg/dL 8.65  Sodium 784 - 696 mmol/L 134(L)  Potassium 3.5 - 5.1 mmol/L 4.4  Chloride 101 - 111 mmol/L 106  CO2 22 - 32 mmol/L 20(L)  Calcium 8.9 - 10.3 mg/dL 5.9(LL)  Total Protein  6.5 - 8.1 g/dL 6.4(L)  Total Bilirubin 0.3 - 1.2 mg/dL 0.4  Alkaline Phos 38 - 126 U/L 191(H)  AST 15 - 41 U/L 65(H)  ALT 14 - 54 U/L 28    Discharge instruction: per After Visit Summary and "Baby and Me Booklet".  After visit meds:  Allergies as of 10/21/2017   No Known Allergies     Medication List    TAKE these medications   acetaminophen 500 MG tablet Commonly known as:  TYLENOL Take 1,000 mg by mouth every 6 (six) hours as needed for mild pain.   butalbital-acetaminophen-caffeine 50-325-40 MG tablet Commonly known as:  FIORICET, ESGIC Take 1 tablet by mouth every 6 (six) hours as needed for headache.   cyclobenzaprine 10 MG tablet Commonly known as:  FLEXERIL Take 1 tablet (10 mg total) by mouth every 8 (eight) hours as needed for muscle spasms.   ferrous sulfate 325 (65 FE) MG tablet Take 1 tablet (325 mg total) by mouth 2 (two) times daily with a meal.   hydrochlorothiazide 25 MG tablet Commonly known as:  HYDRODIURIL Take 1 tablet (25 mg total) by mouth daily.   ibuprofen 600 MG tablet Commonly known as:  ADVIL,MOTRIN Take 1 tablet (600 mg total) by mouth every 6 (six) hours.   omeprazole 20 MG  capsule Commonly known as:  PRILOSEC Take 1 capsule (20 mg total) by mouth 2 (two) times daily before a meal.   prenatal multivitamin Tabs tablet Take 1 tablet by mouth daily at 12 noon.       Diet: low salt diet  Activity: Advance as tolerated. Pelvic rest for 6 weeks.   Outpatient follow up: Later this week for BP check and in 4-6 weeks for postpartum appointment Follow up Appt:No future appointments. Follow up Visit:No Follow-up on file.  Postpartum contraception: Nexplanon  Newborn Data: Live born female  Birth Weight: 5 lb 5 oz (2410 g) APGAR: 9, 9  Newborn Delivery   Birth date/time:  10/19/2017 07:09:00 Delivery type:  Vaginal, Spontaneous     Baby Feeding: Bottle Disposition:home with mother   10/21/2017 Catalina AntiguaPeggy Taja Pentland, MD

## 2017-10-21 NOTE — Progress Notes (Signed)
Patient is very nice and talks about her children when engaged but I have not seen her hold her baby unless you hand it to her.For two days she has wanted someone else to care for her baby because she states she is exhausted.She was on a Magnesium drip that can make them feel fatigued but last night she stated she felt so much better now the the medication is off and she still did not want to take care of the baby she wanted it to go to the nursery.I kept it for 3 hours at the desk.The baby clothes had vomit all on it and its diaper was soaked.  Social Work Librarian, academicConsult ordered for [employer].

## 2017-10-21 NOTE — Telephone Encounter (Signed)
Chart opened in error

## 2017-10-22 ENCOUNTER — Encounter (HOSPITAL_COMMUNITY): Payer: Self-pay | Admitting: *Deleted

## 2017-10-22 ENCOUNTER — Encounter: Payer: 59 | Admitting: Women's Health

## 2017-10-22 ENCOUNTER — Emergency Department (HOSPITAL_COMMUNITY)
Admission: EM | Admit: 2017-10-22 | Discharge: 2017-10-22 | Disposition: A | Discharge status: Home / Self Care | Payer: 59 | Source: Home / Self Care | Attending: Emergency Medicine | Admitting: Emergency Medicine

## 2017-10-22 ENCOUNTER — Other Ambulatory Visit: Payer: Self-pay

## 2017-10-22 DIAGNOSIS — O9989 Other specified diseases and conditions complicating pregnancy, childbirth and the puerperium: Secondary | ICD-10-CM

## 2017-10-22 DIAGNOSIS — I10 Essential (primary) hypertension: Secondary | ICD-10-CM

## 2017-10-22 DIAGNOSIS — I160 Hypertensive urgency: Secondary | ICD-10-CM

## 2017-10-22 DIAGNOSIS — Z79899 Other long term (current) drug therapy: Secondary | ICD-10-CM

## 2017-10-22 DIAGNOSIS — O1415 Severe pre-eclampsia, complicating the puerperium: Secondary | ICD-10-CM | POA: Diagnosis not present

## 2017-10-22 DIAGNOSIS — IMO0002 Reserved for concepts with insufficient information to code with codable children: Secondary | ICD-10-CM

## 2017-10-22 HISTORY — DX: Unspecified pre-eclampsia, unspecified trimester: O14.90

## 2017-10-22 LAB — COMPREHENSIVE METABOLIC PANEL
ALK PHOS: 193 U/L — AB (ref 38–126)
ALT: 53 U/L (ref 14–54)
AST: 98 U/L — AB (ref 15–41)
Albumin: 2.7 g/dL — ABNORMAL LOW (ref 3.5–5.0)
Anion gap: 10 (ref 5–15)
BILIRUBIN TOTAL: 0.5 mg/dL (ref 0.3–1.2)
BUN: 11 mg/dL (ref 6–20)
CO2: 20 mmol/L — ABNORMAL LOW (ref 22–32)
CREATININE: 0.67 mg/dL (ref 0.44–1.00)
Calcium: 8.5 mg/dL — ABNORMAL LOW (ref 8.9–10.3)
Chloride: 108 mmol/L (ref 101–111)
GFR calc Af Amer: >60 mL/min (ref >60)
Glucose, Bld: 88 mg/dL (ref 65–99)
Potassium: 4.3 mmol/L (ref 3.5–5.1)
Sodium: 138 mmol/L (ref 135–145)
TOTAL PROTEIN: 6.7 g/dL (ref 6.5–8.1)

## 2017-10-22 LAB — CBC WITH DIFFERENTIAL/PLATELET
BASOS ABS: 0 10*3/uL (ref 0.0–0.1)
Basophils Relative: 0 %
Eosinophils Absolute: 0.3 10*3/uL (ref 0.0–0.7)
Eosinophils Relative: 2 %
HCT: 28.4 % — ABNORMAL LOW (ref 36.0–46.0)
Hemoglobin: 8.6 g/dL — ABNORMAL LOW (ref 12.0–15.0)
LYMPHS ABS: 1.9 10*3/uL (ref 0.7–4.0)
Lymphocytes Relative: 12 %
MCH: 24.2 pg — ABNORMAL LOW (ref 26.0–34.0)
MCHC: 30.3 g/dL (ref 30.0–36.0)
MCV: 79.8 fL (ref 78.0–100.0)
MONO ABS: 0.6 10*3/uL (ref 0.1–1.0)
Monocytes Relative: 4 %
NEUTROS ABS: 12.9 10*3/uL — AB (ref 1.7–7.7)
Neutrophils Relative %: 82 %
PLATELETS: 309 10*3/uL (ref 150–400)
RBC: 3.56 MIL/uL — AB (ref 3.87–5.11)
RDW: 16.5 % — AB (ref 11.5–15.5)
WBC: 15.7 10*3/uL — AB (ref 4.0–10.5)

## 2017-10-22 LAB — URINALYSIS, ROUTINE W REFLEX MICROSCOPIC
Bacteria, UA: NONE SEEN
Bilirubin Urine: NEGATIVE
GLUCOSE, UA: NEGATIVE mg/dL
KETONES UR: NEGATIVE mg/dL
NITRITE: NEGATIVE
PH: 7 (ref 5.0–8.0)
Protein, ur: NEGATIVE mg/dL
Specific Gravity, Urine: 1.006 (ref 1.005–1.030)

## 2017-10-22 MED ORDER — LABETALOL HCL 200 MG PO TABS
100.0000 mg | ORAL_TABLET | Freq: Once | ORAL | Status: AC
  Administered 2017-10-22: 100 mg via ORAL
  Filled 2017-10-22: qty 1

## 2017-10-22 NOTE — ED Notes (Signed)
Pt states she no longer has a headache and feels "much better". Pt states "I can already tell the medication is helping my blood pressure". Pt resting comfortably and states she plans to follow up tomorrow with her doctor for her recent birth of her child.

## 2017-10-22 NOTE — ED Triage Notes (Signed)
Pt states she has been having fluctuations in her BP tonight; pt delivered a 35 week baby x 2 days ago due to severe preeclampsia; pt is c/o headache

## 2017-10-22 NOTE — ED Provider Notes (Signed)
[facility] EMERGENCY DEPARTMENT Provider Note   CSN: [id number] Arrival date & time: 10/22/17  0354     History   Chief Complaint Chief Complaint  Patient presents with  . Hypertension    HPI Melanie Price is a 27 y.o. female.  This patient is a 27 year old female 2 days postpartum after being induced at 35 weeks secondary to preeclampsia.  Her blood pressures improved prior to discharge.  Since she has been home, she has developed headache and elevated blood pressure at home and presents for evaluation of this.  She denies any fevers or chills.  She denies any significant bleeding or discharge.  She was prescribed medication for her blood pressure upon discharge, however has not had the chance to fill this yet.  This evening her blood pressure was 170/100 and she presents for evaluation of this.   The history is provided by the patient.  Hypertension  This is a new problem. The problem occurs constantly. The problem has been gradually worsening. Associated symptoms comments: Childbirth-preeclampsia. Nothing aggravates the symptoms. Nothing relieves the symptoms. She has tried nothing for the symptoms.    Past Medical History:  Diagnosis Date  . Anemia   . Bleeding in early pregnancy 12/19/2015  . Contraceptive management 09/26/2015  . Depression    post-partum depression  . Encounter for Nexplanon removal 09/26/2015  . Headache    migraine  . Miscarriage 12/21/2015  . Preeclampsia     Patient Active Problem List   Diagnosis Date Noted  . SVD (spontaneous vaginal delivery) 10/19/2017  . Severe preeclampsia 10/18/2017  . Preeclampsia, severe, third trimester 10/18/2017  . Supervision of normal pregnancy 04/16/2017    Past Surgical History:  Procedure Laterality Date  . NO PAST SURGERIES      OB History    Gravida Para Term Preterm AB Living   3 1 1   1 1    SAB TAB Ectopic Multiple Live Births   1     0 1       Home Medications    Prior to Admission  medications   Medication Sig Start Date End Date Taking? Authorizing Provider  acetaminophen (TYLENOL) 500 MG tablet Take 1,000 mg by mouth every 6 (six) hours as needed for mild pain.    [provider]  butalbital-acetaminophen-caffeine (FIORICET, ESGIC) [id number] MG tablet Take 1 tablet by mouth every 6 (six) hours as needed for headache. 10/17/17   Cresenzo-Dishmon, Scarlette Calico, CNM  cyclobenzaprine (FLEXERIL) 10 MG tablet Take 1 tablet (10 mg total) by mouth every 8 (eight) hours as needed for muscle spasms. 10/17/17   Cresenzo-Dishmon, Scarlette Calico, CNM  ferrous sulfate 325 (65 FE) MG tablet Take 1 tablet (325 mg total) by mouth 2 (two) times daily with a meal. 10/21/17   Constant, Peggy, MD  hydrochlorothiazide (HYDRODIURIL) 25 MG tablet Take 1 tablet (25 mg total) by mouth daily. 10/21/17   Constant, Peggy, MD  ibuprofen (ADVIL,MOTRIN) 600 MG tablet Take 1 tablet (600 mg total) by mouth every 6 (six) hours. 10/21/17   Constant, Peggy, MD  omeprazole (PRILOSEC) 20 MG capsule Take 1 capsule (20 mg total) by mouth 2 (two) times daily before a meal. 05/11/17   Lavera Guise, MD  Prenatal Vit-Fe Fumarate-FA (PRENATAL MULTIVITAMIN) TABS tablet Take 1 tablet by mouth daily at 12 noon.    [provider]    Family History Family History  Problem Relation Age of Onset  . Cancer Maternal Grandfather  skin  . Pyloric stenosis Son   . Irritable bowel syndrome Paternal Aunt   . Crohn's disease Paternal Aunt   . Heart disease Maternal Aunt   . Heart disease Maternal Uncle     Social History Social History   Tobacco Use  . Smoking status: Never Smoker  . Smokeless tobacco: Never Used  Substance Use Topics  . Alcohol use: No  . Drug use: No     Allergies   Patient has no known allergies.   Review of Systems Review of Systems  All other systems reviewed and are negative.    Physical Exam Updated Vital Signs BP (!) 156/92   Pulse 73   Temp 98.1 F (36.7 C) (Oral)    Resp 18   Ht 5\' 7"  (1.702 m)   LMP 01/30/2017 (Approximate)   SpO2 100%   BMI 33.36 kg/m   Physical Exam  Constitutional: She is oriented to person, place, and time. She appears well-developed and well-nourished. No distress.  HENT:  Head: Normocephalic and atraumatic.  Eyes: EOM are normal. Pupils are equal, round, and reactive to light.  Neck: Normal range of motion. Neck supple.  Cardiovascular: Normal rate and regular rhythm. Exam reveals no gallop and no friction rub.  No murmur heard. Pulmonary/Chest: Effort normal and breath sounds normal. No respiratory distress. She has no wheezes.  Abdominal: Soft. Bowel sounds are normal. She exhibits no distension. There is no tenderness.  Musculoskeletal: Normal range of motion. She exhibits edema.  There is 1+ edema of both lower extremities.  Neurological: She is alert and oriented to person, place, and time. No cranial nerve deficit. She exhibits normal muscle tone. Coordination normal.  Skin: Skin is warm and dry. She is not diaphoretic.  Nursing note and vitals reviewed.    ED Treatments / Results  Labs (all labs ordered are listed, but only abnormal results are displayed) Labs Reviewed  COMPREHENSIVE METABOLIC PANEL - Abnormal; Notable for the following components:      Result Value   CO2 20 (*)    Calcium 8.5 (*)    Albumin 2.7 (*)    AST 98 (*)    Alkaline Phosphatase 193 (*)    All other components within normal limits  CBC WITH DIFFERENTIAL/PLATELET - Abnormal; Notable for the following components:   WBC 15.7 (*)    RBC 3.56 (*)    Hemoglobin 8.6 (*)    HCT 28.4 (*)    MCH 24.2 (*)    RDW 16.5 (*)    All other components within normal limits  URINALYSIS, ROUTINE W REFLEX MICROSCOPIC - Abnormal; Notable for the following components:   Color, Urine STRAW (*)    Hgb urine dipstick LARGE (*)    Leukocytes, UA TRACE (*)    Squamous Epithelial / LPF 0-5 (*)    All other components within normal limits    EKG  EKG  Interpretation None       Radiology No results found.  Procedures Procedures (including critical care time)  Medications Ordered in ED Medications  labetalol (NORMODYNE) tablet 100 mg (100 mg Oral Given 10/22/17 0437)     Initial Impression / Assessment and Plan / ED Course  I have reviewed the triage vital signs and the nursing notes.  Pertinent labs & imaging results that were available during my care of the patient were reviewed by me and considered in my medical decision making (see chart for details).  Patient presents with headache and elevated blood pressure.  She is 2 days postpartum from pregnancy complicated by preeclampsia and induced delivery at 35 weeks.  Her blood pressure was initially 170/100.  She was given labetalol with good results.  It is now 130s over 80 and she states that her headache is gone.  Her laboratory studies reveal LFTs consistent with discharge, and improving WBC count from discharge, and urinalysis without proteinuria.  At this point, I feel as though she is appropriate for discharge.  Her laboratory studies and blood pressures are consistent with where they were upon discharge.  She is to fill the blood pressure medication she was prescribed to take as needed.  She has an appointment to follow-up tomorrow with OB.  Final Clinical Impressions(s) / ED Diagnoses   Final diagnoses:  None    ED Discharge Orders    None       Geoffery Lyonselo, Umair Rosiles, MD 10/22/17 [phone]

## 2017-10-22 NOTE — Discharge Instructions (Signed)
Continue your medications as prescribed at the time of your discharge.  Follow-up tomorrow as scheduled with OB, and return to the ER if your symptoms significantly worsen or change.

## 2017-10-23 ENCOUNTER — Inpatient Hospital Stay (HOSPITAL_COMMUNITY)
Admission: AD | Admit: 2017-10-23 | Discharge: 2017-10-25 | DRG: 776 | Disposition: A | Discharge status: Home / Self Care | Payer: 59 | Source: Ambulatory Visit | Attending: Obstetrics and Gynecology | Admitting: Obstetrics and Gynecology

## 2017-10-23 ENCOUNTER — Ambulatory Visit (INDEPENDENT_AMBULATORY_CARE_PROVIDER_SITE_OTHER): Payer: 59 | Admitting: Obstetrics and Gynecology

## 2017-10-23 ENCOUNTER — Other Ambulatory Visit: Payer: Self-pay

## 2017-10-23 ENCOUNTER — Encounter (HOSPITAL_COMMUNITY): Payer: Self-pay

## 2017-10-23 ENCOUNTER — Encounter: Payer: Self-pay | Admitting: *Deleted

## 2017-10-23 VITALS — BP 150/90 | HR 75 | Ht 68.0 in | Wt 195.5 lb

## 2017-10-23 DIAGNOSIS — R519 Headache, unspecified: Secondary | ICD-10-CM

## 2017-10-23 DIAGNOSIS — O9089 Other complications of the puerperium, not elsewhere classified: Secondary | ICD-10-CM

## 2017-10-23 DIAGNOSIS — Z79899 Other long term (current) drug therapy: Secondary | ICD-10-CM

## 2017-10-23 DIAGNOSIS — R51 Headache: Secondary | ICD-10-CM | POA: Diagnosis not present

## 2017-10-23 DIAGNOSIS — O1495 Unspecified pre-eclampsia, complicating the puerperium: Secondary | ICD-10-CM

## 2017-10-23 DIAGNOSIS — O1415 Severe pre-eclampsia, complicating the puerperium: Principal | ICD-10-CM | POA: Diagnosis present

## 2017-10-23 LAB — CBC
HCT: 29.5 % — ABNORMAL LOW (ref 36.0–46.0)
HEMOGLOBIN: 9.2 g/dL — AB (ref 12.0–15.0)
MCH: 24.6 pg — AB (ref 26.0–34.0)
MCHC: 31.2 g/dL (ref 30.0–36.0)
MCV: 78.9 fL (ref 78.0–100.0)
PLATELETS: 303 10*3/uL (ref 150–400)
RBC: 3.74 MIL/uL — AB (ref 3.87–5.11)
RDW: 17 % — ABNORMAL HIGH (ref 11.5–15.5)
WBC: 13 10*3/uL — ABNORMAL HIGH (ref 4.0–10.5)

## 2017-10-23 LAB — COMPREHENSIVE METABOLIC PANEL
ALBUMIN: 3 g/dL — AB (ref 3.5–5.0)
ALK PHOS: 203 U/L — AB (ref 38–126)
ALT: 81 U/L — AB (ref 14–54)
ANION GAP: 10 (ref 5–15)
AST: 110 U/L — ABNORMAL HIGH (ref 15–41)
BUN: 13 mg/dL (ref 6–20)
CHLORIDE: 101 mmol/L (ref 101–111)
CO2: 22 mmol/L (ref 22–32)
Calcium: 9.1 mg/dL (ref 8.9–10.3)
Creatinine, Ser: 0.89 mg/dL (ref 0.44–1.00)
GFR calc non Af Amer: >60 mL/min (ref >60)
GLUCOSE: 87 mg/dL (ref 65–99)
Potassium: 4.4 mmol/L (ref 3.5–5.1)
SODIUM: 133 mmol/L — AB (ref 135–145)
Total Bilirubin: 0.6 mg/dL (ref 0.3–1.2)
Total Protein: 6.8 g/dL (ref 6.5–8.1)

## 2017-10-23 LAB — PROTEIN / CREATININE RATIO, URINE
Creatinine, Urine: 83 mg/dL
PROTEIN CREATININE RATIO: 2.12 mg/mg{creat} — AB (ref 0.00–0.15)
TOTAL PROTEIN, URINE: 176 mg/dL

## 2017-10-23 MED ORDER — SENNOSIDES-DOCUSATE SODIUM 8.6-50 MG PO TABS
2.0000 | ORAL_TABLET | ORAL | Status: DC
  Filled 2017-10-23: qty 2

## 2017-10-23 MED ORDER — IBUPROFEN 600 MG PO TABS
600.0000 mg | ORAL_TABLET | Freq: Four times a day (QID) | ORAL | Status: DC
  Administered 2017-10-23 – 2017-10-25 (×6): 600 mg via ORAL
  Filled 2017-10-23 (×6): qty 1

## 2017-10-23 MED ORDER — BENZOCAINE-MENTHOL 20-0.5 % EX AERO: 1.0000 "application " | INHALATION_SPRAY | CUTANEOUS | Status: DC | PRN

## 2017-10-23 MED ORDER — AMLODIPINE BESYLATE 5 MG PO TABS
5.0000 mg | ORAL_TABLET | Freq: Every day | ORAL | Status: DC
  Administered 2017-10-23 – 2017-10-24 (×2): 5 mg via ORAL
  Filled 2017-10-23 (×2): qty 1

## 2017-10-23 MED ORDER — SIMETHICONE 80 MG PO CHEW: 80.0000 mg | CHEWABLE_TABLET | ORAL | Status: DC | PRN

## 2017-10-23 MED ORDER — OXYCODONE-ACETAMINOPHEN 5-325 MG PO TABS
1.0000 | ORAL_TABLET | Freq: Once | ORAL | Status: AC
  Administered 2017-10-23: 1 via ORAL
  Filled 2017-10-23: qty 1

## 2017-10-23 MED ORDER — PRENATAL MULTIVITAMIN CH
1.0000 | ORAL_TABLET | Freq: Every day | ORAL | Status: DC
  Administered 2017-10-24: 1 via ORAL
  Filled 2017-10-23: qty 1

## 2017-10-23 MED ORDER — CYCLOBENZAPRINE HCL 10 MG PO TABS
10.0000 mg | ORAL_TABLET | Freq: Once | ORAL | Status: AC
  Administered 2017-10-23: 10 mg via ORAL
  Filled 2017-10-23: qty 1

## 2017-10-23 MED ORDER — ACETAMINOPHEN 325 MG PO TABS: 650.0000 mg | ORAL_TABLET | ORAL | Status: DC | PRN

## 2017-10-23 MED ORDER — IBUPROFEN 600 MG PO TABS
600.0000 mg | ORAL_TABLET | Freq: Once | ORAL | Status: AC
  Administered 2017-10-23: 600 mg via ORAL
  Filled 2017-10-23: qty 1

## 2017-10-23 MED ORDER — COCONUT OIL OIL: 1.0000 "application " | TOPICAL_OIL | Status: DC | PRN

## 2017-10-23 MED ORDER — DIPHENHYDRAMINE HCL 25 MG PO CAPS: 25.0000 mg | ORAL_CAPSULE | Freq: Four times a day (QID) | ORAL | Status: DC | PRN

## 2017-10-23 MED ORDER — WITCH HAZEL-GLYCERIN EX PADS: 1.0000 "application " | MEDICATED_PAD | CUTANEOUS | Status: DC | PRN

## 2017-10-23 MED ORDER — ENOXAPARIN SODIUM 40 MG/0.4ML ~~LOC~~ SOLN
40.0000 mg | SUBCUTANEOUS | Status: DC
  Administered 2017-10-23 – 2017-10-24 (×2): 40 mg via SUBCUTANEOUS
  Filled 2017-10-23 (×2): qty 0.4

## 2017-10-23 MED ORDER — LABETALOL HCL 100 MG PO TABS
200.0000 mg | ORAL_TABLET | Freq: Once | ORAL | Status: AC
  Administered 2017-10-23: 200 mg via ORAL
  Filled 2017-10-23: qty 2

## 2017-10-23 MED ORDER — ONDANSETRON HCL 4 MG PO TABS: 4.0000 mg | ORAL_TABLET | ORAL | Status: DC | PRN

## 2017-10-23 MED ORDER — TETANUS-DIPHTH-ACELL PERTUSSIS 5-2.5-18.5 LF-MCG/0.5 IM SUSP: 0.5000 mL | Freq: Once | INTRAMUSCULAR | Status: DC

## 2017-10-23 MED ORDER — LABETALOL HCL 5 MG/ML IV SOLN: 20.0000 mg | INTRAVENOUS | Status: DC | PRN

## 2017-10-23 MED ORDER — LABETALOL HCL 200 MG PO TABS: 200.0000 mg | ORAL_TABLET | Freq: Two times a day (BID) | ORAL | 3 refills | Status: DC

## 2017-10-23 MED ORDER — ONDANSETRON HCL 4 MG/2ML IJ SOLN: 4.0000 mg | INTRAMUSCULAR | Status: DC | PRN

## 2017-10-23 MED ORDER — MAGNESIUM SULFATE 40 G IN LACTATED RINGERS - SIMPLE
2.0000 g/h | INTRAVENOUS | Status: AC
  Administered 2017-10-24: 2 g/h via INTRAVENOUS
  Filled 2017-10-23: qty 500
  Filled 2017-10-23: qty 40

## 2017-10-23 MED ORDER — DIBUCAINE 1 % RE OINT: 1.0000 "application " | TOPICAL_OINTMENT | RECTAL | Status: DC | PRN

## 2017-10-23 MED ORDER — HYDRALAZINE HCL 20 MG/ML IJ SOLN: 5.0000 mg | INTRAMUSCULAR | Status: DC | PRN

## 2017-10-23 MED ORDER — LACTATED RINGERS IV SOLN
INTRAVENOUS | Status: DC
  Administered 2017-10-23 – 2017-10-24 (×2): via INTRAVENOUS

## 2017-10-23 MED ORDER — MAGNESIUM SULFATE BOLUS VIA INFUSION
4.0000 g | Freq: Once | INTRAVENOUS | Status: AC
  Administered 2017-10-23: 4 g via INTRAVENOUS
  Filled 2017-10-23: qty 500

## 2017-10-23 NOTE — MAU Provider Note (Signed)
History     CSN: [id number]  Arrival date and time: 10/23/17 1304   None     Chief Complaint  Patient presents with  . Hypertension  . Headache   HPI   Melanie Price is a 26 y.o. female  [id number] status post vaginal delivery on 3/16 due to severe preeclampsia. She was on magnesium during her stay. She was sent home on HCTZ and has been checking her BP at home. She was seen at [facility] yesterday and was told to follow up with her OB today in the office. She went to her OB office today and was sent her for further evaluation. Says she has had this HA since she came home from the hospital. The HA is located in the back of her head and neck.She has tried ibuprofen and Fioricet whiteout relief. She is taking her BP as prescribed, her last dose was this morning.   OB History    Gravida Para Term Preterm AB Living   '3 2 1 1 1 2   ' SAB TAB Ectopic Multiple Live Births   1     0 2      Past Medical History:  Diagnosis Date  . Anemia   . Bleeding in early pregnancy 12/19/2015  . Contraceptive management 09/26/2015  . Depression    post-partum depression  . Encounter for Nexplanon removal 09/26/2015  . Headache    migraine  . Miscarriage 12/21/2015  . Preeclampsia   . Preeclampsia     Past Surgical History:  Procedure Laterality Date  . NO PAST SURGERIES      Family History  Problem Relation Age of Onset  . Cancer Maternal Grandfather        skin  . Pyloric stenosis Son   . Irritable bowel syndrome Paternal Aunt   . Crohn's disease Paternal Aunt   . Heart disease Maternal Aunt   . Heart disease Maternal Uncle     Social History   Tobacco Use  . Smoking status: Never Smoker  . Smokeless tobacco: Never Used  Substance Use Topics  . Alcohol use: No  . Drug use: No    Allergies: No Known Allergies  Medications Prior to Admission  Medication Sig Dispense Refill Last Dose  . acetaminophen (TYLENOL) 500 MG tablet Take 1,000 mg by mouth every 6 (six) hours as needed  for mild pain.   Taking  . butalbital-acetaminophen-caffeine (FIORICET, ESGIC) 50-325-40 MG tablet Take 1 tablet by mouth every 6 (six) hours as needed for headache. 20 tablet 0 Taking  . cyclobenzaprine (FLEXERIL) 10 MG tablet Take 1 tablet (10 mg total) by mouth every 8 (eight) hours as needed for muscle spasms. 30 tablet 1 Taking  . ferrous sulfate 325 (65 FE) MG tablet Take 1 tablet (325 mg total) by mouth 2 (two) times daily with a meal. 60 tablet 3 Taking  . hydrochlorothiazide (HYDRODIURIL) 25 MG tablet Take 1 tablet (25 mg total) by mouth daily. (Patient taking differently: Take 25 mg by mouth. Taking med BID-TID) 30 tablet 3 Taking  . ibuprofen (ADVIL,MOTRIN) 600 MG tablet Take 1 tablet (600 mg total) by mouth every 6 (six) hours. 30 tablet 0 Taking  . labetalol (NORMODYNE) 200 MG tablet Take 1 tablet (200 mg total) by mouth 2 (two) times daily. 30 tablet 3   . omeprazole (PRILOSEC) 20 MG capsule Take 1 capsule (20 mg total) by mouth 2 (two) times daily before a meal. 60 capsule 0 Taking  . Prenatal Vit-Fe  Fumarate-FA (PRENATAL MULTIVITAMIN) TABS tablet Take 1 tablet by mouth daily at 12 noon.   Taking   Results for orders placed or performed during the hospital encounter of 10/23/17 (from the past 48 hour(s))  Protein / creatinine ratio, urine     Status: None (Preliminary result)   Collection Time: 10/23/17  1:32 PM  Result Value Ref Range   Creatinine, Urine 83.00 mg/dL    Comment: Performed at [facility], [address], [place], [place] [address]   Total Protein, Urine PENDING mg/dL   Protein Creatinine Ratio PENDING 0.00 - 0.15 mg/mg[Cre]  CBC     Status: Abnormal   Collection Time: 10/23/17  1:37 PM  Result Value Ref Range   WBC 13.0 (H) 4.0 - 10.5 K/uL   RBC 3.74 (L) 3.87 - 5.11 MIL/uL   Hemoglobin 9.2 (L) 12.0 - 15.0 g/dL   HCT 29.5 (L) 36.0 - 46.0 %   MCV 78.9 78.0 - 100.0 fL   MCH 24.6 (L) 26.0 - 34.0 pg   MCHC 31.2 30.0 - 36.0 g/dL   RDW 17.0 (H) 11.5 - 15.5 %    Platelets 303 150 - 400 K/uL    Comment: Performed at [facility], [address], [place], [place] [address]  Comprehensive metabolic panel     Status: Abnormal   Collection Time: 10/23/17  1:37 PM  Result Value Ref Range   Sodium 133 (L) 135 - 145 mmol/L   Potassium 4.4 3.5 - 5.1 mmol/L   Chloride 101 101 - 111 mmol/L   CO2 22 22 - 32 mmol/L   Glucose, Bld 87 65 - 99 mg/dL   BUN 13 6 - 20 mg/dL   Creatinine, Ser 0.89 0.44 - 1.00 mg/dL   Calcium 9.1 8.9 - 10.3 mg/dL   Total Protein 6.8 6.5 - 8.1 g/dL   Albumin 3.0 (L) 3.5 - 5.0 g/dL   AST 110 (H) 15 - 41 U/L   ALT 81 (H) 14 - 54 U/L   Alkaline Phosphatase 203 (H) 38 - 126 U/L   Total Bilirubin 0.6 0.3 - 1.2 mg/dL   GFR calc non Af Amer >60 >60 mL/min   GFR calc Af Amer >60 >60 mL/min    Comment: (NOTE) The eGFR has been calculated using the CKD EPI equation. This calculation has not been validated in all clinical situations. eGFR's persistently <60 mL/min signify possible Chronic Kidney Disease.    Anion gap 10 5 - 15    Comment: Performed at [facility], [address], [place], [place] [address]    Review of Systems  Eyes: Positive for photophobia.  Gastrointestinal: Negative for abdominal pain.  Neurological: Positive for headaches.   Physical Exam   Blood pressure (!) 157/94, pulse 75, temperature 99.2 F (37.3 C), temperature source Oral, resp. rate 18, last menstrual period 01/30/2017, SpO2 99 %.  Patient Vitals for the past 24 hrs:  BP Temp Temp src Pulse Resp SpO2  10/23/17 1416 (!) 157/82 - - 69 - -  10/23/17 1401 (!) 158/90 - - 77 - -  10/23/17 1349 (!) 157/94 - - 75 - -  10/23/17 1345 (!) 157/94 - - 75 - -  10/23/17 1314 (!) 144/88 99.2 F (37.3 C) Oral 77 18 99 %    Physical Exam  Constitutional: She is oriented to person, place, and time. She appears well-developed and well-nourished. No distress.  HENT:  Head: Normocephalic.  Eyes: Pupils are equal, round, and reactive to light.   Musculoskeletal:  Normal range of motion. She exhibits edema.  Neurological: She is alert and oriented to person, place, and time. She has normal reflexes.  Skin: Skin is warm. She is not diaphoretic.  Psychiatric: Her behavior is normal.    MAU Course  Procedures  None   MDM  PIH labs pending Tylenol 1 gram given PO & Flexeril 10 mg PO  Discussed labs and BP with Dr. Elly Modena. Will admit for Magnesium    Assessment and Plan   A:  1. Pre-eclampsia, severe, postpartum condition   2. Pregnancy headache, postpartum    P:  Admit to the 3rd floor for magnesium  Nellie Pester, Artist Pais, NP 10/23/2017 2:46 PM

## 2017-10-23 NOTE — H&P (Signed)
History     CSN: [id number]  Arrival date and time: 10/23/17 1304   None     Chief Complaint  Patient presents with  . Hypertension  . Headache   HPI   [name] is a 27 y.o. female  [id number] status post vaginal delivery on 3/16 due to severe preeclampsia. She was on magnesium during her stay. She was sent home on HCTZ and has been checking her BP at home. She was seen at [facility] yesterday due to elevated BP readings at home and was told to follow up with her OB today in the office. She went to her OB office today and was sent her for further evaluation. Says she has had this HA since she came home from the hospital. The HA is located in the back of her head and neck.She has tried ibuprofen and Fioricet whiteout relief. She is taking her BP as prescribed, her last dose was this morning.   OB History    Gravida Para Term Preterm AB Living   '3 2 1 1 1 2   ' SAB TAB Ectopic Multiple Live Births   1     0 2      Past Medical History:  Diagnosis Date  . Anemia   . Bleeding in early pregnancy 12/19/2015  . Contraceptive management 09/26/2015  . Depression    post-partum depression  . Encounter for Nexplanon removal 09/26/2015  . Headache    migraine  . Miscarriage 12/21/2015  . Preeclampsia   . Preeclampsia     Past Surgical History:  Procedure Laterality Date  . NO PAST SURGERIES      Family History  Problem Relation Age of Onset  . Cancer Maternal Grandfather        skin  . Pyloric stenosis Son   . Irritable bowel syndrome Paternal Aunt   . Crohn's disease Paternal Aunt   . Heart disease Maternal Aunt   . Heart disease Maternal Uncle     Social History   Tobacco Use  . Smoking status: Never Smoker  . Smokeless tobacco: Never Used  Substance Use Topics  . Alcohol use: No  . Drug use: No    Allergies: No Known Allergies  Medications Prior to Admission  Medication Sig Dispense Refill Last Dose  . acetaminophen (TYLENOL) 500 MG tablet Take 1,000 mg by  mouth every 6 (six) hours as needed for mild pain.   Taking  . butalbital-acetaminophen-caffeine (FIORICET, ESGIC) 50-325-40 MG tablet Take 1 tablet by mouth every 6 (six) hours as needed for headache. 20 tablet 0 Taking  . cyclobenzaprine (FLEXERIL) 10 MG tablet Take 1 tablet (10 mg total) by mouth every 8 (eight) hours as needed for muscle spasms. 30 tablet 1 Taking  . ferrous sulfate 325 (65 FE) MG tablet Take 1 tablet (325 mg total) by mouth 2 (two) times daily with a meal. 60 tablet 3 Taking  . hydrochlorothiazide (HYDRODIURIL) 25 MG tablet Take 1 tablet (25 mg total) by mouth daily. (Patient taking differently: Take 25 mg by mouth. Taking med BID-TID) 30 tablet 3 Taking  . ibuprofen (ADVIL,MOTRIN) 600 MG tablet Take 1 tablet (600 mg total) by mouth every 6 (six) hours. 30 tablet 0 Taking  . labetalol (NORMODYNE) 200 MG tablet Take 1 tablet (200 mg total) by mouth 2 (two) times daily. 30 tablet 3   . omeprazole (PRILOSEC) 20 MG capsule Take 1 capsule (20 mg total) by mouth 2 (two) times daily before a meal.  60 capsule 0 Taking  . Prenatal Vit-Fe Fumarate-FA (PRENATAL MULTIVITAMIN) TABS tablet Take 1 tablet by mouth daily at 12 noon.   Taking   Results for orders placed or performed during the hospital encounter of 10/23/17 (from the past 48 hour(s))  Protein / creatinine ratio, urine     Status: None (Preliminary result)   Collection Time: 10/23/17  1:32 PM  Result Value Ref Range   Creatinine, Urine 83.00 mg/dL    Comment: Performed at [facility], [address] [place], [place] [address]   Total Protein, Urine PENDING mg/dL   Protein Creatinine Ratio PENDING 0.00 - 0.15 mg/mg[Cre]  CBC     Status: Abnormal   Collection Time: 10/23/17  1:37 PM  Result Value Ref Range   WBC 13.0 (H) 4.0 - 10.5 K/uL   RBC 3.74 (L) 3.87 - 5.11 MIL/uL   Hemoglobin 9.2 (L) 12.0 - 15.0 g/dL   HCT 29.5 (L) 36.0 - 46.0 %   MCV 78.9 78.0 - 100.0 fL   MCH 24.6 (L) 26.0 - 34.0 pg   MCHC 31.2 30.0 - 36.0  g/dL   RDW 17.0 (H) 11.5 - 15.5 %   Platelets 303 150 - 400 K/uL    Comment: Performed at [facility], [address], [place], [place] [address]  Comprehensive metabolic panel     Status: Abnormal   Collection Time: 10/23/17  1:37 PM  Result Value Ref Range   Sodium 133 (L) 135 - 145 mmol/L   Potassium 4.4 3.5 - 5.1 mmol/L   Chloride 101 101 - 111 mmol/L   CO2 22 22 - 32 mmol/L   Glucose, Bld 87 65 - 99 mg/dL   BUN 13 6 - 20 mg/dL   Creatinine, Ser 0.89 0.44 - 1.00 mg/dL   Calcium 9.1 8.9 - 10.3 mg/dL   Total Protein 6.8 6.5 - 8.1 g/dL   Albumin 3.0 (L) 3.5 - 5.0 g/dL   AST 110 (H) 15 - 41 U/L   ALT 81 (H) 14 - 54 U/L   Alkaline Phosphatase 203 (H) 38 - 126 U/L   Total Bilirubin 0.6 0.3 - 1.2 mg/dL   GFR calc non Af Amer >60 >60 mL/min   GFR calc Af Amer >60 >60 mL/min    Comment: (NOTE) The eGFR has been calculated using the CKD EPI equation. This calculation has not been validated in all clinical situations. eGFR's persistently <60 mL/min signify possible Chronic Kidney Disease.    Anion gap 10 5 - 15    Comment: Performed at [facility], [address] [place], [place] [address]    Review of Systems  Eyes: Positive for photophobia.  Gastrointestinal: Negative for abdominal pain.  Neurological: Positive for headaches.   Physical Exam   Blood pressure (!) 157/94, pulse 75, temperature 99.2 F (37.3 C), temperature source Oral, resp. rate 18, last menstrual period 01/30/2017, SpO2 99 %.  Patient Vitals for the past 24 hrs:  BP Temp Temp src Pulse Resp SpO2  10/23/17 1416 (!) 157/82 - - 69 - -  10/23/17 1401 (!) 158/90 - - 77 - -  10/23/17 1349 (!) 157/94 - - 75 - -  10/23/17 1345 (!) 157/94 - - 75 - -  10/23/17 1314 (!) 144/88 99.2 F (37.3 C) Oral 77 18 99 %    Physical Exam  Constitutional: She is oriented to person, place, and time. She appears well-developed and well-nourished. No distress.  HENT:  Head: Normocephalic.  Eyes: Pupils are equal,  round, and reactive to light.  Musculoskeletal: Normal range of motion. She exhibits edema.  Neurological: She is alert and oriented to person, place, and time. She has normal reflexes.  Skin: Skin is warm. She is not diaphoretic.  Psychiatric: Her behavior is normal.    MAU Course  Procedures  None   MDM  PIH labs pending Tylenol 1 gram given PO & Flexeril 10 mg PO  Discussed labs and BP with Dr. Elly Modena. Will admit for Magnesium    Assessment and Plan   A:  1. Pre-eclampsia, severe, postpartum condition   2. Pregnancy headache, postpartum    P:  Admit to the 3rd floor for magnesium  Jermiah Soderman, Artist Pais, NP 10/23/2017 2:46 PM

## 2017-10-23 NOTE — Progress Notes (Addendum)
Patient ID: Melanie Price, female   DOB: 11/23/1990, 26 y.o.   MRN: [id number]   [facility] Visit  @DATE@            Patient name: Melanie Price MRN [id number]  Date of birth: 02/16/1991  CC & HPI:  Melanie Price is a 26 y.o. female presenting today for a blood pressure check. She was seen at [facility] yesterday, 10/22/17 for same complaint. She was also [facility]. Associated symptoms include HA, visual changes (spots and blurred vision) and increased urinary output. She reports that her HA is worse at night and describes it as a sharp pain to the back of her head that radiates down her neck. The patient reports that her headache improved , actually went away for a few hours when she was given labetalol at [facility]. She is currently on HCTZ. She denies fever, chills or any other symptoms or complaints at this time.   ROS:  ROS  +HTN +HA +visual changes +urinary frequency  -fever -chills All systems are negative except as noted in the HPI and PMH.   Pertinent History Reviewed:   Reviewed: Significant for Pre-eclampsia Medical         Past Medical History:  Diagnosis Date  . Anemia   . Bleeding in early pregnancy 12/19/2015  . Contraceptive management 09/26/2015  . Depression    post-partum depression  . Encounter for Nexplanon removal 09/26/2015  . Headache    migraine  . Miscarriage 12/21/2015  . Preeclampsia   . Preeclampsia                               Surgical Hx:    Past Surgical History:  Procedure Laterality Date  . NO PAST SURGERIES     Medications: Reviewed & Updated - see associated section                       Current Outpatient Medications:  .  acetaminophen (TYLENOL) 500 MG tablet, Take 1,000 mg by mouth every 6 (six) hours as needed for mild pain., Disp: , Rfl:  .  butalbital-acetaminophen-caffeine (FIORICET, ESGIC) 50-325-40 MG tablet, Take 1 tablet by mouth every 6 (six) hours as needed for headache., Disp: 20  tablet, Rfl: 0 .  cyclobenzaprine (FLEXERIL) 10 MG tablet, Take 1 tablet (10 mg total) by mouth every 8 (eight) hours as needed for muscle spasms., Disp: 30 tablet, Rfl: 1 .  ferrous sulfate 325 (65 FE) MG tablet, Take 1 tablet (325 mg total) by mouth 2 (two) times daily with a meal., Disp: 60 tablet, Rfl: 3 .  hydrochlorothiazide (HYDRODIURIL) 25 MG tablet, Take 1 tablet (25 mg total) by mouth daily. (Patient taking differently: Take 25 mg by mouth. Taking med BID-TID), Disp: 30 tablet, Rfl: 3 .  ibuprofen (ADVIL,MOTRIN) 600 MG tablet, Take 1 tablet (600 mg total) by mouth every 6 (six) hours., Disp: 30 tablet, Rfl: 0 .  omeprazole (PRILOSEC) 20 MG capsule, Take 1 capsule (20 mg total) by mouth 2 (two) times daily before a meal., Disp: 60 capsule, Rfl: 0 .  Prenatal Vit-Fe Fumarate-FA (PRENATAL MULTIVITAMIN) TABS tablet, Take 1 tablet by mouth daily at 12 noon., Disp: , Rfl:    Social History: Reviewed -  reports that  has never smoked. she has never used smokeless tobacco.  Objective Findings:  Vitals: Blood pressure (!)   150/90, pulse 75, height 5' 8" (1.727 m), weight 195 lb 8 oz (88.7 kg), last menstrual period 01/30/2017, not currently breastfeeding.  PHYSICAL EXAMINATION General appearance - alert, well appearing, and in no distress, oriented to person, place, and time Pupils equal round reactive   and minimal edema the headache pain seems to wax and wane during her visit Mental status - alert, oriented to person, place, and time, affect appropriate to mood Extremities - 1+ reflexes without clonus   PELVIC DEFERRED  Assessment & Plan:   A:  1.  residual PP preeclampsia, with H/A.  2.   P:  1. Will send to [facility] MAU to Check urine Pr/Cr, check CMP 2. I have tenatively ordered labetalol 200 bid x 1 wk if she is deemed stable for outpt care 3. BP check 48 hr. Friday appt scheduled.   By signing my name below, I, Jennifer Gorman, attest that this documentation has been prepared  under the direction and in the presence of JVFerguson Electronically Signed: Jennifer Gorman, Medical Scribe. 10/23/17. 11:31 AM.  I personally performed the services described in this documentation, which was SCRIBED in my presence. The recorded information has been reviewed and considered accurate. It has been edited as necessary during review. John V Ferguson, MD   

## 2017-10-23 NOTE — MAU Note (Signed)
Sent in for Pre-e eval.  BP up, ongoing headaches, seeing spots, feet still swollen. Denies epigastric pain.  Vag del 3/16, induced because of BP.  Baby doing well, breast and bottle feeding.

## 2017-10-23 NOTE — Anesthesia Pain Management Evaluation Note (Signed)
   Pain Management Visit Note  Patient: Melanie Price, 27 y.o., female   Called to see patient readmitted to floor.  Pt is suffering from Occipital headache and rates it as a 6/10.  Headache predated childbirth  and  it started several days before admission on Saturday.   She was re-admitted today with elevated blood pressure and is currently being managed with Magnesium and antihypertensive medication along with pain medications.  Additionally she feels that relief is with standing and the headache is much worse when she reclines. Due to the fact that it predates epidural and is not positional in the classical PDPH sense, along with review of uncomplicated epidural placement ,  I feel it is safe to say its not related to Anesthesia/Epidural.     Dedra Matsuo  [phone] 10/23/2017

## 2017-10-23 NOTE — Progress Notes (Signed)
Patient ID: Melanie Price, female   DOB: 03/05/1991, 27 y.o.   MRN: [id number]   [facility]  @[facility] @            Patient name: Melanie Price MRN [id number]  Date of birth: 08/19/1990  CC & HPI:  Melanie Price is a 27 y.o. female presenting today for a blood pressure check. She was [facility], 10/22/17 for same complaint. [facility] Associated symptoms include HA, visual changes (spots and blurred vision) and increased urinary output. She reports that her HA is worse at night and describes it as a sharp pain to the back of her head that radiates down her neck. The patient reports that her headache improved , actually went away for a few hours when she was given labetalol at [facility]. She is currently on HCTZ. She denies fever, chills or any other symptoms or complaints at this time.   ROS:  ROS  +HTN +HA +visual changes +urinary frequency  -fever -chills All systems are negative except as noted in the HPI and PMH.   Pertinent History Reviewed:   Reviewed: Significant for Pre-eclampsia Medical         Past Medical History:  Diagnosis Date  . Anemia   . Bleeding in early pregnancy 12/19/2015  . Contraceptive management 09/26/2015  . Depression    post-partum depression  . Encounter for Nexplanon removal 09/26/2015  . Headache    migraine  . Miscarriage 12/21/2015  . Preeclampsia   . Preeclampsia                               Surgical Hx:    Past Surgical History:  Procedure Laterality Date  . NO PAST SURGERIES     Medications: Reviewed & Updated - see associated section                       Current Outpatient Medications:  .  acetaminophen (TYLENOL) 500 MG tablet, Take 1,000 mg by mouth every 6 (six) hours as needed for mild pain., Disp: , Rfl:  .  butalbital-acetaminophen-caffeine (FIORICET, ESGIC) 50-325-40 MG tablet, Take 1 tablet by mouth every 6 (six) hours as needed for headache., Disp: 20  tablet, Rfl: 0 .  cyclobenzaprine (FLEXERIL) 10 MG tablet, Take 1 tablet (10 mg total) by mouth every 8 (eight) hours as needed for muscle spasms., Disp: 30 tablet, Rfl: 1 .  ferrous sulfate 325 (65 FE) MG tablet, Take 1 tablet (325 mg total) by mouth 2 (two) times daily with a meal., Disp: 60 tablet, Rfl: 3 .  hydrochlorothiazide (HYDRODIURIL) 25 MG tablet, Take 1 tablet (25 mg total) by mouth daily. (Patient taking differently: Take 25 mg by mouth. Taking med BID-TID), Disp: 30 tablet, Rfl: 3 .  ibuprofen (ADVIL,MOTRIN) 600 MG tablet, Take 1 tablet (600 mg total) by mouth every 6 (six) hours., Disp: 30 tablet, Rfl: 0 .  omeprazole (PRILOSEC) 20 MG capsule, Take 1 capsule (20 mg total) by mouth 2 (two) times daily before a meal., Disp: 60 capsule, Rfl: 0 .  Prenatal Vit-Fe Fumarate-FA (PRENATAL MULTIVITAMIN) TABS tablet, Take 1 tablet by mouth daily at 12 noon., Disp: , Rfl:    Social History: Reviewed -  reports that  has never smoked. she has never used smokeless tobacco.  Objective Findings:  Vitals: Blood pressure Marland Kitchen(!)  150/90, pulse 75, height 5\' 8"  (1.727 m), weight 195 lb 8 oz (88.7 kg), last menstrual period 01/30/2017, not currently breastfeeding.  PHYSICAL EXAMINATION General appearance - alert, well appearing, and in no distress, oriented to person, place, and time Pupils equal round reactive   and minimal edema the headache pain seems to wax and wane during her visit Mental status - alert, oriented to person, place, and time, affect appropriate to mood Extremities - 1+ reflexes without clonus   PELVIC DEFERRED  Assessment & Plan:   A:  1.  residual PP preeclampsia, with H/A.  2.   P:  1. Will send to [facility] MAU to Check urine Pr/Cr, check CMP 2. I have tenatively ordered labetalol 200 bid x 1 wk if she is deemed stable for outpt care 3. BP check 48 hr. Friday appt scheduled.   By signing my name below, I, Diona Browner, attest that this documentation has been prepared  under the direction and in the presence of JVFerguson Electronically Signed: Diona Browner, Medical Scribe. 10/23/17. 11:31 AM.  I personally performed the services described in this documentation, which was SCRIBED in my presence. The recorded information has been reviewed and considered accurate. It has been edited as necessary during review. Tilda Burrow, MD

## 2017-10-24 LAB — CBC
HCT: 32.2 % — ABNORMAL LOW (ref 36.0–46.0)
HEMOGLOBIN: 10 g/dL — AB (ref 12.0–15.0)
MCH: 24.3 pg — ABNORMAL LOW (ref 26.0–34.0)
MCHC: 31.1 g/dL (ref 30.0–36.0)
MCV: 78.3 fL (ref 78.0–100.0)
PLATELETS: 361 10*3/uL (ref 150–400)
RBC: 4.11 MIL/uL (ref 3.87–5.11)
RDW: 17.2 % — AB (ref 11.5–15.5)
WBC: 10.5 10*3/uL (ref 4.0–10.5)

## 2017-10-24 LAB — COMPREHENSIVE METABOLIC PANEL
ALT: 68 U/L — ABNORMAL HIGH (ref 14–54)
ANION GAP: 11 (ref 5–15)
AST: 91 U/L — ABNORMAL HIGH (ref 15–41)
Albumin: 3 g/dL — ABNORMAL LOW (ref 3.5–5.0)
Alkaline Phosphatase: 200 U/L — ABNORMAL HIGH (ref 38–126)
BUN: 11 mg/dL (ref 6–20)
CHLORIDE: 100 mmol/L — AB (ref 101–111)
CO2: 25 mmol/L (ref 22–32)
Calcium: 7.1 mg/dL — ABNORMAL LOW (ref 8.9–10.3)
Creatinine, Ser: 0.79 mg/dL (ref 0.44–1.00)
GFR calc Af Amer: >60 mL/min (ref >60)
GFR calc non Af Amer: >60 mL/min (ref >60)
GLUCOSE: 92 mg/dL (ref 65–99)
POTASSIUM: 4.2 mmol/L (ref 3.5–5.1)
SODIUM: 136 mmol/L (ref 135–145)
Total Bilirubin: 0.5 mg/dL (ref 0.3–1.2)
Total Protein: 6.6 g/dL (ref 6.5–8.1)

## 2017-10-24 NOTE — Progress Notes (Signed)
Post Partum Day 5, readmit day 1 for preE with severe features (h/a, pressures) Subjective: feels much better, h/a resolved , more alert , animated  Objective: Blood pressure 109/75, pulse 93, temperature 97.8 F (36.6 C), temperature source Oral, resp. rate 16, SpO2 100 %. Pr/ Cr 2.12 much higher than when in hosp Physical Exam:  General: alert, cooperative and no distress Lochia: appropriate Uterine Fundus: nonpalpable Incision:  DVT Evaluation: No evidence of DVT seen on physical exam. Reflexes 2+ no clonus.  Recent Labs    10/23/17 1337 10/24/17 0618  HGB 9.2* 10.0*  HCT 29.5* 32.2*    Assessment/Plan: Plan for discharge tomorrow  LFT pending from this am Mg d/c today at 4 pm  LOS: 1 day   Melanie BurrowJohn V Burle Price 10/24/2017, 7:38 AM

## 2017-10-25 ENCOUNTER — Ambulatory Visit: Payer: 59 | Admitting: Obstetrics and Gynecology

## 2017-10-25 DIAGNOSIS — O1415 Severe pre-eclampsia, complicating the puerperium: Principal | ICD-10-CM

## 2017-10-25 DIAGNOSIS — R51 Headache: Secondary | ICD-10-CM

## 2017-10-25 DIAGNOSIS — O9089 Other complications of the puerperium, not elsewhere classified: Secondary | ICD-10-CM

## 2017-10-25 MED ORDER — AMLODIPINE BESYLATE 5 MG PO TABS: 5.0000 mg | ORAL_TABLET | Freq: Every day | ORAL | 0 refills | Status: DC

## 2017-10-25 NOTE — Discharge Summary (Signed)
OB Discharge Summary     Patient Name: Melanie Price DOB: 1991/03/18 MRN: [id number]  Date of admission: 10/23/2017 Date of discharge: 10/25/2017  Admitting diagnosis: PP,PRE E Intrauterine pregnancy: Unknown     Secondary diagnosis:  Active Problems:   Preeclampsia in postpartum period   Hospital course:  Patient readmitted on PPD#4 with postpartum preeclampsia. Patient underwent IOL at 35 weeks due to severe preeclampsia. She received magnesium sulfate for seizure prophylaxis and was discharge on HCTZ on PPD#2. She returned on PPD#4 with severe headaches and elevated BP. AST/ALT were also elevated. She received magnesium sulfate for 24 hours for seizure prophylaxis and was started on Norvasc 5 mg daily. She remained normotensive and asymptomatic. She was eager to be discharged home with plans to follow for BP check with [facility] as discussed with Dr. Emelda Fear  Physical exam  Vitals:   10/24/17 2113 10/25/17 0020 10/25/17 0435 10/25/17 0800  BP: 133/70 127/79 122/77 114/63  Pulse: 83 85 76 81  Resp: 16 17 18 18   Temp: 98.6 F (37 C) 98.7 F (37.1 C) 98.8 F (37.1 C) 98 F (36.7 C)  TempSrc: Oral Oral Oral Oral  SpO2: 100% 100% 99% 100%  Weight:      Height:       General: alert, cooperative and no distress Lochia: appropriate Uterine Fundus: firm DVT Evaluation: No evidence of DVT seen on physical exam. Labs: Lab Results  Component Value Date   WBC 10.5 10/24/2017   HGB 10.0 (L) 10/24/2017   HCT 32.2 (L) 10/24/2017   MCV 78.3 10/24/2017   PLT 361 10/24/2017   CMP Latest Ref Rng & Units 10/24/2017  Glucose 65 - 99 mg/dL 92  BUN 6 - 20 mg/dL 11  Creatinine 4.09 - 8.11 mg/dL 9.14  Sodium 782 - 956 mmol/L 136  Potassium 3.5 - 5.1 mmol/L 4.2  Chloride 101 - 111 mmol/L 100(L)  CO2 22 - 32 mmol/L 25  Calcium 8.9 - 10.3 mg/dL 7.1(L)  Total Protein 6.5 - 8.1 g/dL 6.6  Total Bilirubin 0.3 - 1.2 mg/dL 0.5  Alkaline Phos 38 - 126 U/L 200(H)  AST 15 - 41 U/L 91(H)   ALT 14 - 54 U/L 68(H)    Discharge instruction: per After Visit Summary and "Baby and Me Booklet".  After visit meds:  Allergies as of 10/25/2017   No Known Allergies     Medication List    STOP taking these medications   acetaminophen 500 MG tablet Commonly known as:  TYLENOL   hydrochlorothiazide 25 MG tablet Commonly known as:  HYDRODIURIL   labetalol 200 MG tablet Commonly known as:  NORMODYNE   omeprazole 20 MG capsule Commonly known as:  PRILOSEC     TAKE these medications   amLODipine 5 MG tablet Commonly known as:  NORVASC Take 1 tablet (5 mg total) by mouth daily.   butalbital-acetaminophen-caffeine 50-325-40 MG tablet Commonly known as:  FIORICET, ESGIC Take 1 tablet by mouth every 6 (six) hours as needed for headache.   cyclobenzaprine 10 MG tablet Commonly known as:  FLEXERIL Take 1 tablet (10 mg total) by mouth every 8 (eight) hours as needed for muscle spasms.   ferrous sulfate 325 (65 FE) MG tablet Take 1 tablet (325 mg total) by mouth 2 (two) times daily with a meal.   ibuprofen 600 MG tablet Commonly known as:  ADVIL,MOTRIN Take 1 tablet (600 mg total) by mouth every 6 (six) hours.   prenatal multivitamin Tabs tablet Take  1 tablet by mouth daily at 12 noon.       Diet: routine diet  Activity: Advance as tolerated. Pelvic rest for 6 weeks.   Outpatient follow up: Follow up Appt: Future Appointments  Date Time Provider Department Center  10/25/2017 10:00 AM Tilda BurrowFerguson, John V, MD [facility]  11/26/2017  1:45 PM Cresenzo-Dishmon, Scarlette CalicoFrances, CNM [facility]   Follow up Visit:No follow-ups on file.   10/25/2017 Catalina AntiguaPeggy Viriginia Amendola, MD

## 2017-10-28 ENCOUNTER — Encounter: Payer: Self-pay | Admitting: Obstetrics & Gynecology

## 2017-10-28 ENCOUNTER — Ambulatory Visit (INDEPENDENT_AMBULATORY_CARE_PROVIDER_SITE_OTHER): Payer: 59 | Admitting: Obstetrics & Gynecology

## 2017-10-28 VITALS — BP 108/64 | HR 69 | Ht 68.0 in | Wt 187.5 lb

## 2017-10-28 DIAGNOSIS — O1495 Unspecified pre-eclampsia, complicating the puerperium: Secondary | ICD-10-CM

## 2017-10-28 NOTE — Progress Notes (Signed)
Chief Complaint  Patient presents with  . Blood Pressure Check      27 y.o. [id number] No LMP recorded. The current method of family planning is none.  Outpatient Encounter Medications as of 10/28/2017  Medication Sig  . amLODipine (NORVASC) 5 MG tablet Take 1 tablet (5 mg total) by mouth daily.  . ferrous sulfate 325 (65 FE) MG tablet Take 1 tablet (325 mg total) by mouth 2 (two) times daily with a meal.  . ibuprofen (ADVIL,MOTRIN) 600 MG tablet Take 1 tablet (600 mg total) by mouth every 6 (six) hours.  . Prenatal Vit-Fe Fumarate-FA (PRENATAL MULTIVITAMIN) TABS tablet Take 1 tablet by mouth daily at 12 noon.  . [DISCONTINUED] butalbital-acetaminophen-caffeine (FIORICET, ESGIC) 50-325-40 MG tablet Take 1 tablet by mouth every 6 (six) hours as needed for headache.  . [DISCONTINUED] cyclobenzaprine (FLEXERIL) 10 MG tablet Take 1 tablet (10 mg total) by mouth every 8 (eight) hours as needed for muscle spasms. (Patient not taking: Reported on 10/23/2017)   No facility-administered encounter medications on file as of 10/28/2017.     Subjective Melanie Price  is in for BP check  She is on Norvasc 5 and HCTZ 25 She had to be readmitted for a 2nd course of magnseium sulfate prophylaxis and now she has no headaches vision issues or RUQ pain Past Medical History:  Diagnosis Date  . Anemia   . Bleeding in early pregnancy 12/19/2015  . Contraceptive management 09/26/2015  . Depression    post-partum depression  . Encounter for Nexplanon removal 09/26/2015  . Headache    migraine  . Miscarriage 12/21/2015  . Preeclampsia   . Preeclampsia     Past Surgical History:  Procedure Laterality Date  . NO PAST SURGERIES      OB History    Gravida  3   Para  2   Term  1   Preterm  1   AB  1   Living  2     SAB  1   TAB      Ectopic      Multiple  0   Live Births  2           No Known Allergies  Social History   Socioeconomic History  . Marital status: Married     Spouse name: Not on file  . Number of children: Not on file  . Years of education: Not on file  . Highest education level: Not on file  Occupational History  . Not on file  Social Needs  . Financial resource strain: Not on file  . Food insecurity:    Worry: Not on file    Inability: Not on file  . Transportation needs:    Medical: Not on file    Non-medical: Not on file  Tobacco Use  . Smoking status: Never Smoker  . Smokeless tobacco: Never Used  Substance and Sexual Activity  . Alcohol use: No  . Drug use: No  . Sexual activity: Not Currently    Birth control/protection: None  Lifestyle  . Physical activity:    Days per week: Not on file    Minutes per session: Not on file  . Stress: Not on file  Relationships  . Social connections:    Talks on phone: Not on file    Gets together: Not on file    Attends religious service: Not on file    Active member of club or organization: Not on file  Attends meetings of clubs or organizations: Not on file    Relationship status: Not on file  Other Topics Concern  . Not on file  Social History Narrative  . Not on file    Family History  Problem Relation Age of Onset  . Cancer Maternal Grandfather        skin  . Pyloric stenosis Son   . Irritable bowel syndrome Paternal Aunt   . Crohn's disease Paternal Aunt   . Heart disease Maternal Aunt   . Heart disease Maternal Uncle     Medications:       Current Outpatient Medications:  .  amLODipine (NORVASC) 5 MG tablet, Take 1 tablet (5 mg total) by mouth daily., Disp: 30 tablet, Rfl: 0 .  ferrous sulfate 325 (65 FE) MG tablet, Take 1 tablet (325 mg total) by mouth 2 (two) times daily with a meal., Disp: 60 tablet, Rfl: 3 .  ibuprofen (ADVIL,MOTRIN) 600 MG tablet, Take 1 tablet (600 mg total) by mouth every 6 (six) hours., Disp: 30 tablet, Rfl: 0 .  Prenatal Vit-Fe Fumarate-FA (PRENATAL MULTIVITAMIN) TABS tablet, Take 1 tablet by mouth daily at 12 noon., Disp: , Rfl:    Objective Blood pressure 108/64, pulse 69, height 5\' 8"  (1.727 m), weight 187 lb 8 oz (85 kg).  Gen WDWN NAD no headache no CNS symptoms or visual issues since her second dose of magnesium sulfate  Pertinent ROS Per HPI exam  Labs or studies No new    Impression Diagnoses this Encounter::   ICD-10-CM   1. Preeclampsia in postpartum period O14.95     Established relevant diagnosis(es):   Plan/Recommendations: No orders of the defined types were placed in this encounter.   Labs or Scans Ordered: No orders of the defined types were placed in this encounter.   Management:: BP today is excellent on meds Continue on Norvasc 5 and HCTZ 25, follow up in 2 weeks  Follow up Return in about 2 weeks (around 11/11/2017) for Follow up, with Dr Despina Hidden.        Face to face time:  10 minutes  Greater than 50% of the visit time was spent in counseling and coordination of care with the patient.  The summary and outline of the counseling and care coordination is summarized in the note above.   All questions were answered.

## 2017-10-29 ENCOUNTER — Telehealth (HOSPITAL_COMMUNITY): Payer: Self-pay | Admitting: Emergency Medicine

## 2017-10-29 NOTE — Telephone Encounter (Signed)
-----   Message from Tilda BurrowJohn Ferguson V, MD sent at 10/23/2017 10:56 PM EDT ----- Regarding: followup Pt had progression of her preeclampsia sx and was readmitted. Thought you'd want to know.  Her LFT continued to increase

## 2017-11-03 ENCOUNTER — Other Ambulatory Visit: Payer: Self-pay

## 2017-11-03 ENCOUNTER — Emergency Department (HOSPITAL_COMMUNITY)
Admission: EM | Admit: 2017-11-03 | Discharge: 2017-11-03 | Disposition: A | Discharge status: Home / Self Care | Payer: 59 | Attending: Emergency Medicine | Admitting: Emergency Medicine

## 2017-11-03 DIAGNOSIS — Z5321 Procedure and treatment not carried out due to patient leaving prior to being seen by health care provider: Secondary | ICD-10-CM | POA: Diagnosis not present

## 2017-11-03 DIAGNOSIS — N939 Abnormal uterine and vaginal bleeding, unspecified: Secondary | ICD-10-CM | POA: Diagnosis not present

## 2017-11-03 LAB — I-STAT BETA HCG BLOOD, ED (MC, WL, AP ONLY)

## 2017-11-03 NOTE — ED Triage Notes (Addendum)
The patient just gave birth two weeks ago and has been bleeding since.  She said it had eased off but now she is passing numerous blood clots,.  She is changing her pads 1 every hour.  She is only has severe pain with urination.

## 2017-11-03 NOTE — ED Notes (Signed)
Located patient on pediatric unit with child who has been admitted. Pt no longer wanting to be seen, doesn't want to leave her child. LWBS.

## 2017-11-03 NOTE — ED Notes (Signed)
No answer x1

## 2017-11-03 NOTE — ED Notes (Signed)
Pt called for vitals x1 

## 2017-11-06 NOTE — ED Notes (Signed)
11/06/2017, follow-up call completed.  Message left for a return call if needed.

## 2017-11-12 ENCOUNTER — Encounter: Payer: Self-pay | Admitting: Obstetrics & Gynecology

## 2017-11-12 ENCOUNTER — Ambulatory Visit (INDEPENDENT_AMBULATORY_CARE_PROVIDER_SITE_OTHER): Payer: 59 | Admitting: Obstetrics & Gynecology

## 2017-11-12 VITALS — BP 112/64 | HR 83 | Ht 68.0 in | Wt 187.0 lb

## 2017-11-12 DIAGNOSIS — Z0130 Encounter for examination of blood pressure without abnormal findings: Secondary | ICD-10-CM

## 2017-11-12 NOTE — Progress Notes (Signed)
Chief Complaint  Patient presents with  . Follow-up    on BP      27 y.o. [id number] No LMP recorded. The current method of family planning is none.  Outpatient Encounter Medications as of 11/12/2017  Medication Sig  . ferrous sulfate 325 (65 FE) MG tablet Take 1 tablet (325 mg total) by mouth 2 (two) times daily with a meal.  . Norethin-Eth Estrad-Fe Biphas (LO LOESTRIN FE PO) Take by mouth daily.  . [DISCONTINUED] ibuprofen (ADVIL,MOTRIN) 600 MG tablet Take 1 tablet (600 mg total) by mouth every 6 (six) hours. (Patient taking differently: Take 600 mg by mouth as needed. )  . [DISCONTINUED] amLODipine (NORVASC) 5 MG tablet Take 1 tablet (5 mg total) by mouth daily.  . [DISCONTINUED] Prenatal Vit-Fe Fumarate-FA (PRENATAL MULTIVITAMIN) TABS tablet Take 1 tablet by mouth daily at 12 noon.   No facility-administered encounter medications on file as of 11/12/2017.     Subjective Pp was on norvasc none now Here for BP check  Normal no meds needed Past Medical History:  Diagnosis Date  . Anemia   . Bleeding in early pregnancy 12/19/2015  . Contraceptive management 09/26/2015  . Depression    post-partum depression  . Encounter for Nexplanon removal 09/26/2015  . Headache    migraine  . Miscarriage 12/21/2015  . Preeclampsia   . Preeclampsia     Past Surgical History:  Procedure Laterality Date  . NO PAST SURGERIES      OB History    Gravida  3   Para  2   Term  1   Preterm  1   AB  1   Living  2     SAB  1   TAB      Ectopic      Multiple  0   Live Births  2           No Known Allergies  Social History   Socioeconomic History  . Marital status: Married    Spouse name: Not on file  . Number of children: Not on file  . Years of education: Not on file  . Highest education level: Not on file  Occupational History  . Not on file  Social Needs  . Financial resource strain: Not on file  . Food insecurity:    Worry: Not on file    Inability:  Not on file  . Transportation needs:    Medical: Not on file    Non-medical: Not on file  Tobacco Use  . Smoking status: Never Smoker  . Smokeless tobacco: Never Used  Substance and Sexual Activity  . Alcohol use: No  . Drug use: No  . Sexual activity: Not Currently    Birth control/protection: Implant  Lifestyle  . Physical activity:    Days per week: Not on file    Minutes per session: Not on file  . Stress: Not on file  Relationships  . Social connections:    Talks on phone: Not on file    Gets together: Not on file    Attends religious service: Not on file    Active member of club or organization: Not on file    Attends meetings of clubs or organizations: Not on file    Relationship status: Not on file  Other Topics Concern  . Not on file  Social History Narrative  . Not on file    Family History  Problem Relation Age of Onset  .  Cancer Maternal Grandfather        skin  . Pyloric stenosis Son   . Irritable bowel syndrome Paternal Aunt   . Crohn's disease Paternal Aunt   . Heart disease Maternal Aunt   . Heart disease Maternal Uncle     Medications:       Current Outpatient Medications:  .  ferrous sulfate 325 (65 FE) MG tablet, Take 1 tablet (325 mg total) by mouth 2 (two) times daily with a meal., Disp: 60 tablet, Rfl: 3 .  Norethin-Eth Estrad-Fe Biphas (LO LOESTRIN FE PO), Take by mouth daily., Disp: , Rfl:  .  acyclovir (ZOVIRAX) 400 MG tablet, Take 1 tablet (400 mg total) by mouth 5 (five) times daily., Disp: 35 tablet, Rfl: 0 .  doxycycline (VIBRAMYCIN) 100 MG capsule, Take 1 capsule (100 mg total) by mouth 2 (two) times daily., Disp: 20 capsule, Rfl: 0 .  escitalopram (LEXAPRO) 10 MG tablet, Take 1 tablet (10 mg total) by mouth daily. (Patient not taking: Reported on 12/10/2017), Disp: 30 tablet, Rfl: 6 .  ibuprofen (ADVIL,MOTRIN) 600 MG tablet, Take 1 tablet (600 mg total) by mouth 4 (four) times daily., Disp: 30 tablet, Rfl: 0  Objective Blood pressure  112/64, pulse 83, height 5\' 8"  (1.727 m), weight 187 lb (84.8 kg).    Pertinent ROS   Labs or studies     Impression Diagnoses this Encounter::   ICD-10-CM   1. BP check Z01.30     Established relevant diagnosis(es):   Plan/Recommendations: No orders of the defined types were placed in this encounter.   Labs or Scans Ordered: No orders of the defined types were placed in this encounter.   Management::   Follow up Return in about 3 weeks (around 12/03/2017) for post partum visit, order Nexplanon [facility].        Face to face time:  10 minutes  Greater than 50% of the visit time was spent in counseling and coordination of care with the patient.  The summary and outline of the counseling and care coordination is summarized in the note above.   All questions were answered.

## 2017-11-21 ENCOUNTER — Other Ambulatory Visit: Payer: Self-pay | Admitting: Obstetrics and Gynecology

## 2017-11-26 ENCOUNTER — Encounter: Payer: Self-pay | Admitting: Advanced Practice Midwife

## 2017-11-26 ENCOUNTER — Ambulatory Visit (INDEPENDENT_AMBULATORY_CARE_PROVIDER_SITE_OTHER): Payer: 59 | Admitting: Advanced Practice Midwife

## 2017-11-26 MED ORDER — ESCITALOPRAM OXALATE 10 MG PO TABS: 10.0000 mg | ORAL_TABLET | Freq: Every day | ORAL | 6 refills | Status: DC

## 2017-11-26 NOTE — Patient Instructions (Signed)
Post[place] Depression Support Groups in [place] 1. Feelings After Birth support group, every Tuesday in [place],  [place]10am-11am- Call Duffy RhodyLori Davenport, at [phone] for more information 2. [facility], First Wednesday of each month at noon (feel free to bring a bagged lunch), Third Tuesday of each month at 6:30pm. [facility], 5th Floor [facility] Class[facility], [address], [place], [place]               Contact [place], [place], [facility], [place] at [phone]  Postpartum Support International(PSI) [employer]armline at [phone] The PSI [employer]ie BunWarmline is a toll-free telephone number anyone can call to get basic information, support, and resources. Dial extension 1 for Spanish and extension 2 for English. The Warmline messages are returned every day of the week. You are welcome to leave a confidential message any time, and one of the warmline volunteers will return your call as soon as possible. If you are not able to talk when the volunteer calls you, you can arrange another time to connect. The volunteer will give you information, encouragement, and names of resources near you.The PSI Warmline is not a crisis hotline and does not handle emergencies. People in crisis should call their physicians, their local emergency number or the National Suicide Prevention Hotline at 1-800-273-TALK (908)646-1070(8255). Apoyo de PSI para las familias hispano parlantes ([phone], #1 Llame al nmero de telfono gratuito para obtener recursos, apoyo e informacin gratuita. Djenos un mensaje y un voluntario le devolver la llamada.   Suicide Prevention Hotline: 907-475-58781-979-596-5370

## 2017-11-26 NOTE — Progress Notes (Signed)
Melanie SkinnerKayla C Price is a 27 y.o. who presents for a postpartum visit. She is 5 .5 weeks postpartum following a spontaneous vaginal delivery. I have fully reviewed the prenatal and intrapartum course. The delivery was at 36.1 gestational weeks. IOL for severe preE.  Anesthesia: epidural. Postpartum course has been complicated by a readmission day 4 for residual preE w/worsening LFTs.  Home on Norvasc 5mg    Last norvasc was a few weeks ago. Baby's course has been uneventful. Baby is feeding by bottle. Bleeding: staining only. Bowel function is normal. Bladder function is normal. Patient is sexually active. Contraception method is OCP (estrogen/progesterone).Had an old pack of LoLoestrin and started it a few weeks ago (didn't consult anyone)   Postpartum depression screening: 14 (borderline). .Feels depressed, had PPD w/first baby. [name].  Offered counseling referral and has already started seeing Melanie Price at baby's primary care office.  Offered SSRI and accepted.   Current Outpatient Medications:  .  ferrous sulfate 325 (65 FE) MG tablet, Take 1 tablet (325 mg total) by mouth 2 (two) times daily with a meal., Disp: 60 tablet, Rfl: 3 .  ibuprofen (ADVIL,MOTRIN) 600 MG tablet, Take 1 tablet (600 mg total) by mouth every 6 (six) hours. (Patient taking differently: Take 600 mg by mouth as needed. ), Disp: 30 tablet, Rfl: 0 .  Norethin-Eth Estrad-Fe Biphas (LO LOESTRIN FE PO), Take by mouth daily., Disp: , Rfl:   Review of Systems   Constitutional: Negative for fever and chills Eyes: Negative for visual disturbances Respiratory: Negative for shortness of breath, dyspnea Cardiovascular: Negative for chest pain or palpitations Gastrointestinal: Negative for vomiting, diarrhea and constipation Genitourinary: Negative for dysuria and urgency Musculoskeletal: Negative for back pain, joint pain, myalgias  Neurological: Negative for dizziness and headaches    Objective:     Vitals:   11/26/17 1355  BP: 130/70   Pulse: 83   General:  alert, cooperative and no distress   Breasts:  negative  Lungs: Normal respiratory effort  Heart:  regular rate and rhythm  Abdomen: Soft, nontender   Vulva:  normal  Vagina: normal vagina  Cervix:  closed  Corpus: Well involuted     Rectal Exam: no hemorrhoids        Assessment:    normal postpartum exam.  Plan:   1. Contraception: Nexplanon 2. Follow up in: 4/30 as scheduled for Nexplanon.   3.  If pack of pills runs out prior to 4/27, will need refill to avoid [facility] (pt to count pills and let me know if she needs a refill) 4. Lexapro 10mg  daily. Will f/u 4 weeks and let me know how she is doing.

## 2017-11-28 ENCOUNTER — Encounter: Payer: 59 | Admitting: Advanced Practice Midwife

## 2017-12-03 ENCOUNTER — Encounter: Payer: 59 | Admitting: Women's Health

## 2017-12-10 ENCOUNTER — Ambulatory Visit (INDEPENDENT_AMBULATORY_CARE_PROVIDER_SITE_OTHER): Payer: 59 | Admitting: Advanced Practice Midwife

## 2017-12-10 ENCOUNTER — Other Ambulatory Visit: Payer: Self-pay

## 2017-12-10 ENCOUNTER — Encounter: Payer: Self-pay | Admitting: Advanced Practice Midwife

## 2017-12-10 VITALS — BP 112/68 | HR 75 | Ht 68.0 in | Wt 182.0 lb

## 2017-12-10 DIAGNOSIS — Z3046 Encounter for surveillance of implantable subdermal contraceptive: Secondary | ICD-10-CM | POA: Diagnosis not present

## 2017-12-10 DIAGNOSIS — Z3202 Encounter for pregnancy test, result negative: Secondary | ICD-10-CM

## 2017-12-10 DIAGNOSIS — Z30017 Encounter for initial prescription of implantable subdermal contraceptive: Secondary | ICD-10-CM | POA: Insufficient documentation

## 2017-12-10 LAB — POCT URINE PREGNANCY: Preg Test, Ur: NEGATIVE

## 2017-12-10 MED ORDER — ETONOGESTREL 68 MG ~~LOC~~ IMPL
68.0000 mg | DRUG_IMPLANT | Freq: Once | SUBCUTANEOUS | Status: AC
  Administered 2017-12-10: 68 mg via SUBCUTANEOUS

## 2017-12-10 NOTE — Progress Notes (Signed)
  HPI:  Melanie Price is a 27 y.o. year old Caucasian female here for Nexplanon insertion.  Her pregnancy test today was negative.  Risks/benefits/side effects of Nexplanon have been discussed and her questions have been answered.  Specifically, a failure rate of 08/998 has been reported, with an increased failure rate if pt takes St. John's Wort and/or antiseizure medicaitons.  Mickeal Skinner is aware of the common side effect of irregular bleeding, which the incidence of decreases over time. Vitals:   12/10/17 0842  BP: 112/68  Pulse: 75     Past Medical History: Past Medical History:  Diagnosis Date  . Anemia   . Bleeding in early pregnancy 12/19/2015  . Contraceptive management 09/26/2015  . Depression    post-partum depression  . Encounter for Nexplanon removal 09/26/2015  . Headache    migraine  . Miscarriage 12/21/2015  . Preeclampsia   . Preeclampsia     Past Surgical History: Past Surgical History:  Procedure Laterality Date  . NO PAST SURGERIES      Family History: Family History  Problem Relation Age of Onset  . Cancer Maternal Grandfather        skin  . Pyloric stenosis Son   . Irritable bowel syndrome Paternal Aunt   . Crohn's disease Paternal Aunt   . Heart disease Maternal Aunt   . Heart disease Maternal Uncle     Social History: Social History   Tobacco Use  . Smoking status: Never Smoker  . Smokeless tobacco: Never Used  Substance Use Topics  . Alcohol use: No  . Drug use: No    Allergies: No Known Allergies    Her left arm, approximatly 4 inches proximal from the elbow, was cleansed with alcohol and anesthetized with 2cc of 2% Lidocaine.  The area was cleansed again and the Nexplanon was inserted without difficulty.  A pressure bandage was applied.  Pt was instructed to remove pressure bandage in a few hours, and keep insertion site covered with a bandaid for 3 days.  Back up contraception was recommended for 2 weeks.  Follow-up scheduled  PRN problems  Jacklyn Shell 12/10/2017 9:07 AM

## 2017-12-18 ENCOUNTER — Telehealth: Payer: 59 | Admitting: Family

## 2017-12-18 ENCOUNTER — Telehealth: Payer: Self-pay | Admitting: Advanced Practice Midwife

## 2017-12-18 NOTE — Telephone Encounter (Signed)
Patient called stating that she would like to speak with a nurse because she had her Nexplanon inserted on 5/7 and she has a rash around the insertion spot. Please contact pt

## 2017-12-18 NOTE — Progress Notes (Signed)
Based on what you shared with me it looks like you have a serious condition that should be evaluated in a face to face office visit.  NOTE: If you entered your credit card information for this eVisit, you will not be charged. You may see a "hold" on your card for the $30 but that hold will drop off and you will not have a charge processed.  I am sorry we do not treat children through evisits. I recommend that she follow up with her PCP.    If you are having a true medical emergency please call 911.  If you need an urgent face to face visit, [place] has four urgent care centers for your convenience.  If you need care fast and have a high deductible or no insurance consider:   [email or web address] to reserve your spot online an avoid wait times  [facility] [address] [place], [place] [address] 8 am to 8 pm Monday-Friday 10 am to 4 pm Saturday-Sunday *Across the street from [employer]  [address] [place], [address] 8 am to 5 pm Monday-Friday * In the [facility] on the [facility]   The following sites will take your  insurance:  . [facility] [place] [facility]  [phone] Get Driving Directions Find a Provider at this Location  [address] [place], [place] [address] . 10 am to 8 pm Monday-Friday . 12 pm to 8 pm Saturday-Sunday   . [facility] at [facility]  [phone] Get Driving Directions Find a Provider at this Location  [address] [place], [place] [address] . 8 am to 8 pm Monday-Friday . 9 am to 6 pm Saturday . 11 am to 6 pm Sunday   . [facility] at [facility]  [phone] Get Driving Directions  [address] [place], [place] [address] . 8 am to 8 pm Monday-Friday . 8 am to 4 pm Saturday-Sunday   Your e-visit answers were reviewed by a board certified advanced clinical practitioner to complete your  personal care plan.  Thank you for using e-Visits.

## 2017-12-18 NOTE — Telephone Encounter (Signed)
Pt called stating that yesterday she developed a rash over her nexplanon site. The nexplanon had been put in around a week ago and she states that she removed the steri strips the day after insertion. She describes it as patchy red areas. Advised pt to take a bendaryl to see if that would clear the rash up and if it did not, to call us back tomorrow so she can be seen and evaluated by a provider. Pt verbalized understanding.

## 2017-12-19 ENCOUNTER — Telehealth: Payer: Self-pay | Admitting: *Deleted

## 2017-12-19 ENCOUNTER — Ambulatory Visit (INDEPENDENT_AMBULATORY_CARE_PROVIDER_SITE_OTHER): Payer: 59 | Admitting: Advanced Practice Midwife

## 2017-12-19 ENCOUNTER — Encounter: Payer: Self-pay | Admitting: Advanced Practice Midwife

## 2017-12-19 VITALS — BP 104/68 | HR 79 | Ht 68.0 in | Wt 182.0 lb

## 2017-12-19 DIAGNOSIS — L309 Dermatitis, unspecified: Secondary | ICD-10-CM | POA: Diagnosis not present

## 2017-12-19 NOTE — Telephone Encounter (Signed)
Informed patient she needed to be seen so to come at 10:45. Patient verbalized understanding.

## 2017-12-19 NOTE — Progress Notes (Signed)
[facility] Visit  Patient name: MARISEL TOSTENSON MRN [id number]  Date of birth: [birth year]  CC & HPI:  Melanie Price is a 27 y.o.  female presenting today for  A rash that developed around her Nexplanon site .She had it placed on 5/7 and noticed a rash developing 5/14. It comes and goes on it's own, is itchy and also burns some at times.  She has taken benadryl a few times, seems to respond, but still comes back  Pertinent History Reviewed:  Medical & Surgical Hx:   Past Medical History:  Diagnosis Date  . Anemia   . Bleeding in early pregnancy 12/19/2015  . Contraceptive management 09/26/2015  . Depression    post-partum depression  . Encounter for Nexplanon removal 09/26/2015  . Headache    migraine  . Miscarriage 12/21/2015  . Preeclampsia   . Preeclampsia    Past Surgical History:  Procedure Laterality Date  . NO PAST SURGERIES     Family History  Problem Relation Age of Onset  . Cancer Maternal Grandfather        skin  . Pyloric stenosis Son   . Irritable bowel syndrome Paternal Aunt   . Crohn's disease Paternal Aunt   . Heart disease Maternal Aunt   . Heart disease Maternal Uncle     Current Outpatient Medications:  .  escitalopram (LEXAPRO) 10 MG tablet, Take 1 tablet (10 mg total) by mouth daily. (Patient not taking: Reported on 12/10/2017), Disp: 30 tablet, Rfl: 6 .  ferrous sulfate 325 (65 FE) MG tablet, Take 1 tablet (325 mg total) by mouth 2 (two) times daily with a meal., Disp: 60 tablet, Rfl: 3 .  ibuprofen (ADVIL,MOTRIN) 600 MG tablet, Take 1 tablet (600 mg total) by mouth every 6 (six) hours. (Patient taking differently: Take 600 mg by mouth as needed. ), Disp: 30 tablet, Rfl: 0 .  Norethin-Eth Estrad-Fe Biphas (LO LOESTRIN FE PO), Take by mouth daily., Disp: , Rfl:  Social History: Reviewed -  reports that she has never smoked. She has never used smokeless tobacco.  Review of Systems:   Constitutional: Negative for fever and chills Eyes: Negative  for visual disturbances Respiratory: Negative for shortness of breath, dyspnea Cardiovascular: Negative for chest pain or palpitations  Gastrointestinal: Negative for vomiting, diarrhea and constipation; no abdominal pain Genitourinary: Negative for dysuria and urgency, vaginal irritation or itching Musculoskeletal: Negative for back pain, joint pain, myalgias  Neurological: Negative for dizziness and headaches    Objective Findings:    Physical Examination: Vitals:   12/19/17 1052  BP: 104/68  Pulse: 79   General appearance - well appearing, and in no distress Mental status - alert, oriented to person, place, and time Chest:  Normal respiratory effort Heart - normal rate and regular rhythm Left Arm: right now there is an area of erythema distal to insertion site, 1cm X 5 cm (patchy), not raised. However, a photo of her arm shows raised plaques that are more red.   Pelvic: deferred Musculoskeletal:  Normal range of motion without pain Extremities:  No edema    No results found for this or any previous visit (from the past 24 hour(s)).    Assessment & Plan:  A:   ??histamine d/t ?? l P:  Discussed w/L[facility]; just treat symptomatically, hydrocortisone/benadryl for now   Return for If you have any problems.  Jacklyn Shell CNM 12/19/2017 11:32 AM

## 2017-12-23 ENCOUNTER — Other Ambulatory Visit: Payer: Self-pay | Admitting: Obstetrics and Gynecology

## 2018-01-08 ENCOUNTER — Emergency Department (HOSPITAL_COMMUNITY)
Admission: EM | Admit: 2018-01-08 | Discharge: 2018-01-09 | Disposition: A | Discharge status: Home / Self Care | Payer: 59 | Attending: Emergency Medicine | Admitting: Emergency Medicine

## 2018-01-08 ENCOUNTER — Encounter (HOSPITAL_COMMUNITY): Payer: Self-pay | Admitting: Emergency Medicine

## 2018-01-08 ENCOUNTER — Other Ambulatory Visit: Payer: Self-pay

## 2018-01-08 DIAGNOSIS — B09 Unspecified viral infection characterized by skin and mucous membrane lesions: Secondary | ICD-10-CM

## 2018-01-08 DIAGNOSIS — B349 Viral infection, unspecified: Secondary | ICD-10-CM | POA: Diagnosis not present

## 2018-01-08 DIAGNOSIS — R21 Rash and other nonspecific skin eruption: Secondary | ICD-10-CM | POA: Diagnosis present

## 2018-01-08 NOTE — ED Triage Notes (Signed)
Pt c/o random bumps popping up all over her body x 1.5weeks. Pt states her lip is painful.

## 2018-01-09 ENCOUNTER — Other Ambulatory Visit: Payer: Self-pay

## 2018-01-09 ENCOUNTER — Emergency Department (HOSPITAL_COMMUNITY)
Admission: EM | Admit: 2018-01-09 | Discharge: 2018-01-09 | Disposition: A | Discharge status: Home / Self Care | Payer: 59 | Attending: Emergency Medicine | Admitting: Emergency Medicine

## 2018-01-09 ENCOUNTER — Encounter (HOSPITAL_COMMUNITY): Payer: Self-pay | Admitting: Emergency Medicine

## 2018-01-09 DIAGNOSIS — R21 Rash and other nonspecific skin eruption: Secondary | ICD-10-CM | POA: Diagnosis not present

## 2018-01-09 DIAGNOSIS — B001 Herpesviral vesicular dermatitis: Secondary | ICD-10-CM | POA: Insufficient documentation

## 2018-01-09 DIAGNOSIS — L089 Local infection of the skin and subcutaneous tissue, unspecified: Secondary | ICD-10-CM

## 2018-01-09 MED ORDER — IBUPROFEN 800 MG PO TABS
800.0000 mg | ORAL_TABLET | Freq: Once | ORAL | Status: AC
  Administered 2018-01-09: 800 mg via ORAL
  Filled 2018-01-09: qty 1

## 2018-01-09 MED ORDER — DOXYCYCLINE HYCLATE 100 MG PO CAPS: 100.0000 mg | ORAL_CAPSULE | Freq: Two times a day (BID) | ORAL | 0 refills | Status: DC

## 2018-01-09 MED ORDER — ACYCLOVIR 400 MG PO TABS: 400.0000 mg | ORAL_TABLET | Freq: Every day | ORAL | 0 refills | Status: DC

## 2018-01-09 MED ORDER — ONDANSETRON HCL 4 MG PO TABS
4.0000 mg | ORAL_TABLET | Freq: Once | ORAL | Status: AC
  Administered 2018-01-09: 4 mg via ORAL
  Filled 2018-01-09: qty 1

## 2018-01-09 MED ORDER — ACYCLOVIR 200 MG PO CAPS
400.0000 mg | ORAL_CAPSULE | Freq: Once | ORAL | Status: AC
  Administered 2018-01-09: 400 mg via ORAL
  Filled 2018-01-09 (×2): qty 2

## 2018-01-09 MED ORDER — IBUPROFEN 600 MG PO TABS: 600.0000 mg | ORAL_TABLET | Freq: Four times a day (QID) | ORAL | 0 refills | Status: DC

## 2018-01-09 NOTE — ED Triage Notes (Signed)
C/o abscess to face over one week ago and now she states they are generalized over body. None draining per pt. Mild swelling noted to left side of nose.

## 2018-01-09 NOTE — Discharge Instructions (Signed)
Your examination is consistent with cold sores, most likely related to a virus.  These are contagious.  Please wash hands frequently.  Please keep your distance from others.  Please use acyclovir every 4 hours, or 5 times daily.  Use ibuprofen for soreness with each meal and at bedtime.  Please see your primary physician for additional evaluation and follow-up.

## 2018-01-09 NOTE — ED Provider Notes (Signed)
[facility] EMERGENCY DEPARTMENT Provider Note   CSN: [id number] Arrival date & time: 01/08/18  2331     History   Chief Complaint Chief Complaint  Patient presents with  . Rash    HPI Melanie Price is a 27 y.o. female.  The history is provided by the patient.  Rash   This is a new problem. The current episode started more than 1 week ago. The problem has been gradually worsening. Associated with: fever, nausea and sorethroat. The maximum temperature recorded prior to her arrival was 100 to 100.9 F. The rash is present on the right upper leg and left upper leg (nose and mouth). The pain is moderate. The pain has been worsening since onset. Pertinent negatives include no blisters. Associated symptoms comments: Burning sensation. She has tried nothing for the symptoms.    Past Medical History:  Diagnosis Date  . Anemia   . Bleeding in early pregnancy 12/19/2015  . Contraceptive management 09/26/2015  . Depression    post-partum depression  . Encounter for Nexplanon removal 09/26/2015  . Headache    migraine  . Miscarriage 12/21/2015  . Preeclampsia   . Preeclampsia     Patient Active Problem List   Diagnosis Date Noted  . Nexplanon insertion 12/10/2017  . Preeclampsia in postpartum period 10/23/2017  . Severe preeclampsia 10/18/2017  . Preeclampsia, severe, third trimester 10/18/2017    Past Surgical History:  Procedure Laterality Date  . NO PAST SURGERIES       OB History    Gravida  3   Para  2   Term  1   Preterm  1   AB  1   Living  2     SAB  1   TAB      Ectopic      Multiple  0   Live Births  2            Home Medications    Prior to Admission medications   Medication Sig Start Date End Date Taking? Authorizing Provider  escitalopram (LEXAPRO) 10 MG tablet Take 1 tablet (10 mg total) by mouth daily. Patient not taking: Reported on 12/10/2017 11/26/17   Cresenzo-Dishmon, Scarlette Calico, CNM  ferrous sulfate 325 (65 FE) MG tablet Take 1  tablet (325 mg total) by mouth 2 (two) times daily with a meal. 10/21/17   Constant, Peggy, MD  ibuprofen (ADVIL,MOTRIN) 600 MG tablet Take 1 tablet (600 mg total) by mouth every 6 (six) hours. Patient taking differently: Take 600 mg by mouth as needed.  10/21/17   Constant, Peggy, MD  Norethin-Eth Estrad-Fe Biphas (LO LOESTRIN FE PO) Take by mouth daily.    [provider]    Family History Family History  Problem Relation Age of Onset  . Cancer Maternal Grandfather        skin  . Pyloric stenosis Son   . Irritable bowel syndrome Paternal Aunt   . Crohn's disease Paternal Aunt   . Heart disease Maternal Aunt   . Heart disease Maternal Uncle     Social History Social History   Tobacco Use  . Smoking status: Never Smoker  . Smokeless tobacco: Never Used  Substance Use Topics  . Alcohol use: No  . Drug use: No     Allergies   Patient has no known allergies.   Review of Systems Review of Systems  Constitutional: Positive for fever. Negative for activity change.       All ROS Neg except as  noted in HPI  HENT: Positive for congestion. Negative for nosebleeds.   Eyes: Negative for photophobia and discharge.  Respiratory: Negative for cough, shortness of breath and wheezing.   Cardiovascular: Negative for chest pain and palpitations.  Gastrointestinal: Negative for abdominal pain and blood in stool.  Genitourinary: Negative for dysuria, frequency and hematuria.  Musculoskeletal: Negative for arthralgias, back pain and neck pain.  Skin: Positive for rash.  Neurological: Positive for headaches. Negative for dizziness, seizures and speech difficulty.  Psychiatric/Behavioral: Negative for confusion and hallucinations.     Physical Exam Updated Vital Signs BP (!) 110/52 (BP Location: Left Arm)   Pulse 97   Temp 98 F (36.7 C) (Oral)   Ht 5\' 8"  (1.727 m)   Wt 82.6 kg (182 lb)   LMP 01/03/2018   SpO2 100%   BMI 27.67 kg/m   Physical Exam  Constitutional: She  is oriented to person, place, and time. She appears well-developed and well-nourished.  Non-toxic appearance.  HENT:  Head: Normocephalic.  Right Ear: Tympanic membrane and external ear normal.  Left Ear: Tympanic membrane and external ear normal.  There are 3 bumps on the upper lip just under the nares.  There is one bump in the left nares.  There is a small bump on the right corner of the lower lip.  There are no visible lesions in the mouth or on the tongue.  Eyes: Pupils are equal, round, and reactive to light. EOM and lids are normal.  No lesions noted about the eyes.  Neck: Normal range of motion. Neck supple. Carotid bruit is not present.  Cardiovascular: Normal rate, regular rhythm, normal heart sounds, intact distal pulses and normal pulses.  Pulmonary/Chest: Breath sounds normal. No respiratory distress.  Abdominal: Soft. Bowel sounds are normal. There is no tenderness. There is no guarding.  Musculoskeletal: Normal range of motion.  Lymphadenopathy:       Head (right side): No submandibular adenopathy present.       Head (left side): No submandibular adenopathy present.    She has no cervical adenopathy.  Neurological: She is alert and oriented to person, place, and time. She has normal strength. No cranial nerve deficit or sensory deficit.  Skin: Skin is warm and dry.  Psychiatric: She has a normal mood and affect. Her speech is normal.  Nursing note and vitals reviewed.    ED Treatments / Results  Labs (all labs ordered are listed, but only abnormal results are displayed) Labs Reviewed - No data to display  EKG None  Radiology No results found.  Procedures Procedures (including critical care time)  Medications Ordered in ED Medications - No data to display   Initial Impression / Assessment and Plan / ED Course  I have reviewed the triage vital signs and the nursing notes.  Pertinent labs & imaging results that were available during my care of the patient  were reviewed by me and considered in my medical decision making (see chart for details).       Final Clinical Impressions(s) / ED Diagnoses MDM  Patient has a rash about the mouth and nose that appears to be herpetic.  This is following a viral illness.  The areas are sore to touch and seem to be spreading according to the patient.  There is no evidence of drainage at this time.  There is no evidence of a secondary infection.  There is a lesion on the outer portion of the lip, but no visible lesions in the mouth.  The airway is patent.  And there is no swelling of the tongue.  I have instructed the patient that this could be a viral illness, but was most likely related to a herpetic rash similar to a fever blister or fever sore.  Patient is prescribed acyclovir and ibuprofen.  Patient is to follow-up with her primary physician or return to the emergency department if any changes, problems, or concerns.   Final diagnoses:  Viral rash    ED Discharge Orders        Ordered    acyclovir (ZOVIRAX) 400 MG tablet  5 times daily     01/09/18 0029    ibuprofen (ADVIL,MOTRIN) 600 MG tablet  4 times daily     01/09/18 0029       Ivery QualeBryant, Aymara Sassi, PA-C 01/10/18 1817    Devoria AlbeKnapp, Iva, MD 01/12/18 [phone]

## 2018-01-10 NOTE — ED Provider Notes (Signed)
[facility] EMERGENCY DEPARTMENT Provider Note   CSN: [id number] Arrival date & time: 01/09/18  1030     History   Chief Complaint Chief Complaint  Patient presents with  . Abscess    HPI Melanie SkinnerKayla C Price is a 27 y.o. female.  The history is provided by the patient. No language interpreter was used.  Abscess  Location:  Face Facial abscess location:  Face Abscess quality: draining and redness   Red streaking: yes   Progression:  Worsening Chronicity:  New Relieved by:  Nothing Worsened by:  Nothing Ineffective treatments:  None tried Associated symptoms: no fever and no nausea   Pt has oral ulcers.   Past Medical History:  Diagnosis Date  . Anemia   . Bleeding in early pregnancy 12/19/2015  . Contraceptive management 09/26/2015  . Depression    post-partum depression  . Encounter for Nexplanon removal 09/26/2015  . Headache    migraine  . Miscarriage 12/21/2015  . Preeclampsia   . Preeclampsia     Patient Active Problem List   Diagnosis Date Noted  . Nexplanon insertion 12/10/2017  . Preeclampsia in postpartum period 10/23/2017  . Severe preeclampsia 10/18/2017  . Preeclampsia, severe, third trimester 10/18/2017    Past Surgical History:  Procedure Laterality Date  . NO PAST SURGERIES       OB History    Gravida  3   Para  2   Term  1   Preterm  1   AB  1   Living  2     SAB  1   TAB      Ectopic      Multiple  0   Live Births  2            Home Medications    Prior to Admission medications   Medication Sig Start Date End Date Taking? Authorizing Provider  acyclovir (ZOVIRAX) 400 MG tablet Take 1 tablet (400 mg total) by mouth 5 (five) times daily. 01/09/18   Ivery QualeBryant, Hobson, PA-C  doxycycline (VIBRAMYCIN) 100 MG capsule Take 1 capsule (100 mg total) by mouth 2 (two) times daily. 01/09/18   Elson AreasSofia, Leslie K, PA-C  escitalopram (LEXAPRO) 10 MG tablet Take 1 tablet (10 mg total) by mouth daily. Patient not taking: Reported on 12/10/2017  11/26/17   Cresenzo-Dishmon, Scarlette CalicoFrances, CNM  ferrous sulfate 325 (65 FE) MG tablet Take 1 tablet (325 mg total) by mouth 2 (two) times daily with a meal. 10/21/17   Constant, Peggy, MD  ibuprofen (ADVIL,MOTRIN) 600 MG tablet Take 1 tablet (600 mg total) by mouth 4 (four) times daily. 01/09/18   Ivery QualeBryant, Hobson, PA-C  Norethin-Eth Estrad-Fe Biphas (LO LOESTRIN FE PO) Take by mouth daily.    [provider]    Family History Family History  Problem Relation Age of Onset  . Cancer Maternal Grandfather        skin  . Pyloric stenosis Son   . Irritable bowel syndrome Paternal Aunt   . Crohn's disease Paternal Aunt   . Heart disease Maternal Aunt   . Heart disease Maternal Uncle     Social History Social History   Tobacco Use  . Smoking status: Never Smoker  . Smokeless tobacco: Never Used  Substance Use Topics  . Alcohol use: No  . Drug use: No     Allergies   Patient has no known allergies.   Review of Systems Review of Systems  Constitutional: Negative for fever.  Gastrointestinal: Negative for nausea.  All other systems reviewed and are negative.    Physical Exam Updated Vital Signs BP 119/71 (BP Location: Right Arm)   Pulse 71   Temp 98.3 F (36.8 C) (Temporal)   Resp 19   LMP 01/03/2018   SpO2 100%   Physical Exam  Constitutional: She is oriented to person, place, and time. She appears well-developed and well-nourished.  HENT:  Head: Normocephalic.  Swollen red area left nostril and left upper lip,  Looks herpetic  Eyes: EOM are normal.  Neck: Normal range of motion.  Pulmonary/Chest: Effort normal.  Abdominal: She exhibits no distension.  Musculoskeletal: Normal range of motion.  Neurological: She is alert and oriented to person, place, and time.  Psychiatric: She has a normal mood and affect.  Nursing note and vitals reviewed.    ED Treatments / Results  Labs (all labs ordered are listed, but only abnormal results are displayed) Labs Reviewed  - No data to display  EKG None  Radiology No results found.  Procedures Procedures (including critical care time)  Medications Ordered in ED Medications - No data to display   Initial Impression / Assessment and Plan / ED Course  I have reviewed the triage vital signs and the nursing notes.  Pertinent labs & imaging results that were available during my care of the patient were reviewed by me and considered in my medical decision making (see chart for details).     Pt may be getting a seondary infection.  Pt given rx for Doxycycline.  She is advised to continue acyclovir.  Final Clinical Impressions(s) / ED Diagnoses   Final diagnoses:  Herpes labialis  Skin infection    ED Discharge Orders        Ordered    doxycycline (VIBRAMYCIN) 100 MG capsule  2 times daily     01/09/18 1138    An After Visit Summary was printed and given to the patient.    Elson Areas, PA-C 01/10/18 1027    Samuel Jester, DO 01/13/18 2257

## 2018-03-27 ENCOUNTER — Other Ambulatory Visit: Payer: Self-pay | Admitting: Obstetrics and Gynecology

## 2018-05-01 ENCOUNTER — Encounter (HOSPITAL_COMMUNITY): Payer: Self-pay | Admitting: *Deleted

## 2018-05-01 ENCOUNTER — Other Ambulatory Visit: Payer: Self-pay

## 2018-05-01 ENCOUNTER — Emergency Department (HOSPITAL_COMMUNITY)
Admission: EM | Admit: 2018-05-01 | Discharge: 2018-05-02 | Disposition: A | Discharge status: Home / Self Care | Payer: 59 | Attending: Emergency Medicine | Admitting: Emergency Medicine

## 2018-05-01 ENCOUNTER — Emergency Department (HOSPITAL_COMMUNITY): Payer: 59

## 2018-05-01 DIAGNOSIS — R1084 Generalized abdominal pain: Secondary | ICD-10-CM | POA: Diagnosis present

## 2018-05-01 DIAGNOSIS — Z79899 Other long term (current) drug therapy: Secondary | ICD-10-CM | POA: Insufficient documentation

## 2018-05-01 DIAGNOSIS — R109 Unspecified abdominal pain: Secondary | ICD-10-CM

## 2018-05-01 DIAGNOSIS — N390 Urinary tract infection, site not specified: Secondary | ICD-10-CM | POA: Insufficient documentation

## 2018-05-01 DIAGNOSIS — F329 Major depressive disorder, single episode, unspecified: Secondary | ICD-10-CM | POA: Diagnosis not present

## 2018-05-01 DIAGNOSIS — R102 Pelvic and perineal pain: Secondary | ICD-10-CM

## 2018-05-01 LAB — URINALYSIS, ROUTINE W REFLEX MICROSCOPIC
BILIRUBIN URINE: NEGATIVE
Glucose, UA: NEGATIVE mg/dL
Ketones, ur: NEGATIVE mg/dL
Nitrite: NEGATIVE
PROTEIN: NEGATIVE mg/dL
RBC / HPF: >50 RBC/hpf — ABNORMAL HIGH (ref 0–5)
Specific Gravity, Urine: 1.021 (ref 1.005–1.030)
pH: 5 (ref 5.0–8.0)

## 2018-05-01 LAB — COMPREHENSIVE METABOLIC PANEL
ALBUMIN: 4.3 g/dL (ref 3.5–5.0)
ALT: 48 U/L — ABNORMAL HIGH (ref 0–44)
ANION GAP: 9 (ref 5–15)
AST: 66 U/L — ABNORMAL HIGH (ref 15–41)
Alkaline Phosphatase: 139 U/L — ABNORMAL HIGH (ref 38–126)
BILIRUBIN TOTAL: 0.3 mg/dL (ref 0.3–1.2)
BUN: 17 mg/dL (ref 6–20)
CHLORIDE: 106 mmol/L (ref 98–111)
CO2: 23 mmol/L (ref 22–32)
Calcium: 9.3 mg/dL (ref 8.9–10.3)
Creatinine, Ser: 0.78 mg/dL (ref 0.44–1.00)
GFR calc Af Amer: >60 mL/min (ref >60)
GFR calc non Af Amer: >60 mL/min (ref >60)
GLUCOSE: 104 mg/dL — AB (ref 70–99)
POTASSIUM: 3.9 mmol/L (ref 3.5–5.1)
SODIUM: 138 mmol/L (ref 135–145)
Total Protein: 8.1 g/dL (ref 6.5–8.1)

## 2018-05-01 LAB — WET PREP, GENITAL
Clue Cells Wet Prep HPF POC: NONE SEEN
Sperm: NONE SEEN
Trich, Wet Prep: NONE SEEN
YEAST WET PREP: NONE SEEN

## 2018-05-01 LAB — CBC
HEMATOCRIT: 38 % (ref 36.0–46.0)
HEMOGLOBIN: 11.7 g/dL — AB (ref 12.0–15.0)
MCH: 24.1 pg — ABNORMAL LOW (ref 26.0–34.0)
MCHC: 30.8 g/dL (ref 30.0–36.0)
MCV: 78.4 fL (ref 78.0–100.0)
Platelets: 447 10*3/uL — ABNORMAL HIGH (ref 150–400)
RBC: 4.85 MIL/uL (ref 3.87–5.11)
RDW: 15.7 % — ABNORMAL HIGH (ref 11.5–15.5)
WBC: 14.3 10*3/uL — ABNORMAL HIGH (ref 4.0–10.5)

## 2018-05-01 LAB — DIFFERENTIAL
BASOS PCT: 0 %
Basophils Absolute: 0.1 10*3/uL (ref 0.0–0.1)
EOS ABS: 0.3 10*3/uL (ref 0.0–0.7)
EOS PCT: 2 %
Lymphocytes Relative: 20 %
Lymphs Abs: 2.9 10*3/uL (ref 0.7–4.0)
Monocytes Absolute: 0.7 10*3/uL (ref 0.1–1.0)
Monocytes Relative: 5 %
Neutro Abs: 10.6 10*3/uL — ABNORMAL HIGH (ref 1.7–7.7)
Neutrophils Relative %: 73 %

## 2018-05-01 LAB — PREGNANCY, URINE: PREG TEST UR: NEGATIVE

## 2018-05-01 LAB — LIPASE, BLOOD: LIPASE: 38 U/L (ref 11–51)

## 2018-05-01 MED ORDER — ONDANSETRON HCL 4 MG/2ML IJ SOLN
4.0000 mg | INTRAMUSCULAR | Status: DC | PRN
  Administered 2018-05-01: 4 mg via INTRAVENOUS
  Filled 2018-05-01: qty 2

## 2018-05-01 MED ORDER — OXYCODONE-ACETAMINOPHEN 5-325 MG PO TABS
1.0000 | ORAL_TABLET | Freq: Once | ORAL | Status: AC
  Administered 2018-05-01: 1 via ORAL
  Filled 2018-05-01: qty 1

## 2018-05-01 MED ORDER — KETOROLAC TROMETHAMINE 30 MG/ML IJ SOLN
30.0000 mg | Freq: Once | INTRAMUSCULAR | Status: AC
  Administered 2018-05-01: 30 mg via INTRAVENOUS
  Filled 2018-05-01: qty 1

## 2018-05-01 MED ORDER — IOPAMIDOL (ISOVUE-300) INJECTION 61%
100.0000 mL | Freq: Once | INTRAVENOUS | Status: AC | PRN
  Administered 2018-05-01: 100 mL via INTRAVENOUS

## 2018-05-01 NOTE — ED Provider Notes (Signed)
[facility] EMERGENCY DEPARTMENT Provider Note   CSN: [id number] Arrival date & time: 05/01/18  1324     History   Chief Complaint Chief Complaint  Patient presents with  . Abdominal Pain    HPI Melanie Price is a 27 y.o. female.   Abdominal Pain      Pt was seen at 2025.  Per pt, c/o gradual onset and persistence of constant generalized abd "pain" since approximately 1700 today PTA.  Has been associated with nausea.  Describes the abd pain as "sharp all over."  Pt has not taken any medications to treat her symptoms. Denies vomiting/diarrhea, no fevers, no back pain, no rash, no CP/SOB, no black or blood in stools, no dysuria/hematuria, no vaginal bleeding/discharge.     Past Medical History:  Diagnosis Date  . Anemia   . Bleeding in early pregnancy 12/19/2015  . Contraceptive management 09/26/2015  . Depression    post-partum depression  . Encounter for Nexplanon removal 09/26/2015  . Headache    migraine  . Miscarriage 12/21/2015  . Preeclampsia   . Preeclampsia     Patient Active Problem List   Diagnosis Date Noted  . Nexplanon insertion 12/10/2017  . Preeclampsia in postpartum period 10/23/2017  . Severe preeclampsia 10/18/2017  . Preeclampsia, severe, third trimester 10/18/2017    Past Surgical History:  Procedure Laterality Date  . NO PAST SURGERIES       OB History    Gravida  3   Para  2   Term  1   Preterm  1   AB  1   Living  2     SAB  1   TAB      Ectopic      Multiple  0   Live Births  2            Home Medications    Prior to Admission medications   Medication Sig Start Date End Date Taking? Authorizing Provider  acyclovir (ZOVIRAX) 400 MG tablet Take 1 tablet (400 mg total) by mouth 5 (five) times daily. 01/09/18   Ivery Quale, PA-C  doxycycline (VIBRAMYCIN) 100 MG capsule Take 1 capsule (100 mg total) by mouth 2 (two) times daily. 01/09/18   Elson Areas, PA-C  escitalopram (LEXAPRO) 10 MG tablet Take 1 tablet  (10 mg total) by mouth daily. Patient not taking: Reported on 12/10/2017 11/26/17   Cresenzo-Dishmon, Scarlette Calico, CNM  ferrous sulfate 325 (65 FE) MG tablet Take 1 tablet (325 mg total) by mouth 2 (two) times daily with a meal. 10/21/17   Constant, Peggy, MD  ibuprofen (ADVIL,MOTRIN) 600 MG tablet Take 1 tablet (600 mg total) by mouth 4 (four) times daily. 01/09/18   Ivery Quale, PA-C  Norethin-Eth Estrad-Fe Biphas (LO LOESTRIN FE PO) Take by mouth daily.    [provider]    Family History Family History  Problem Relation Age of Onset  . Cancer Maternal Grandfather        skin  . Pyloric stenosis Son   . Irritable bowel syndrome Paternal Aunt   . Crohn's disease Paternal Aunt   . Heart disease Maternal Aunt   . Heart disease Maternal Uncle     Social History Social History   Tobacco Use  . Smoking status: Never Smoker  . Smokeless tobacco: Never Used  Substance Use Topics  . Alcohol use: No  . Drug use: No     Allergies   Patient has no known allergies.   Review  of Systems Review of Systems  Gastrointestinal: Positive for abdominal pain.  ROS: Statement: All systems negative except as marked or noted in the HPI; Constitutional: Negative for fever and chills. ; ; Eyes: Negative for eye pain, redness and discharge. ; ; ENMT: Negative for ear pain, hoarseness, nasal congestion, sinus pressure and sore throat. ; ; Cardiovascular: Negative for chest pain, palpitations, diaphoresis, dyspnea and peripheral edema. ; ; Respiratory: Negative for cough, wheezing and stridor. ; ; Gastrointestinal: +nausea, abd pain. Negative for vomiting, diarrhea, blood in stool, hematemesis, jaundice and rectal bleeding. . ; ; Genitourinary: Negative for dysuria, flank pain and hematuria. ; ; GYN:  No vaginal bleeding, no vaginal discharge, no vulvar pain. ;; Musculoskeletal: Negative for back pain and neck pain. Negative for swelling and trauma.; ; Skin: Negative for pruritus, rash, abrasions,  blisters, bruising and skin lesion.; ; Neuro: Negative for headache, lightheadedness and neck stiffness. Negative for weakness, altered level of consciousness, altered mental status, extremity weakness, paresthesias, involuntary movement, seizure and syncope.      Physical Exam Updated Vital Signs BP 131/63 (BP Location: Right Arm)   Pulse 83   Temp 97.9 F (36.6 C) (Temporal)   Resp 16   Ht 5\' 7"  (1.702 m)   Wt 79.4 kg   SpO2 99%   BMI 27.41 kg/m   Physical Exam 2030: Physical examination:  Nursing notes reviewed; Vital signs and O2 SAT reviewed;  Constitutional: Well developed, Well nourished, Well hydrated, In no acute distress; Head:  Normocephalic, atraumatic; Eyes: EOMI, PERRL, No scleral icterus; ENMT: Mouth and pharynx normal, Mucous membranes moist; Neck: Supple, Full range of motion, No lymphadenopathy; Cardiovascular: Regular rate and rhythm, No gallop; Respiratory: Breath sounds clear & equal bilaterally, No wheezes.  Speaking full sentences with ease, Normal respiratory effort/excursion; Chest: Nontender, Movement normal; Abdomen: Soft, +periumbilical tenderness to palp. No rebound or guarding. Nondistended, Normal bowel sounds; Genitourinary: No CVA tenderness. Pelvic exam performed with permission of pt and female ED RN assist during exam.  External genitalia w/o lesions. Vaginal vault with white discharge.  Cervix w/o lesions, not friable, GC/chlam and wet prep obtained and sent to lab.  Bimanual exam w/o CMT, +uterine and bilateral adnexal tenderness.;;; Extremities: Peripheral pulses normal, No tenderness, No edema, No calf edema or asymmetry.; Neuro: AA&Ox3, Major CN grossly intact.  Speech clear. No gross focal motor or sensory deficits in extremities.; Skin: Color normal, Warm, Dry.   ED Treatments / Results  Labs (all labs ordered are listed, but only abnormal results are displayed)   EKG None  Radiology   Procedures Procedures (including critical care  time)  Medications Ordered in ED Medications  ketorolac (TORADOL) 30 MG/ML injection 30 mg (has no administration in time range)  ondansetron (ZOFRAN) injection 4 mg (has no administration in time range)     Initial Impression / Assessment and Plan / ED Course  I have reviewed the triage vital signs and the nursing notes.  Pertinent labs & imaging results that were available during my care of the patient were reviewed by me and considered in my medical decision making (see chart for details).  MDM Reviewed: previous chart, nursing note and vitals Reviewed previous: labs Interpretation: labs and CT scan   Results for orders placed or performed during the hospital encounter of 05/01/18  Wet prep, genital  Result Value Ref Range   Yeast Wet Prep HPF POC NONE SEEN NONE SEEN   Trich, Wet Prep NONE SEEN NONE SEEN   Clue Cells Wet  Prep HPF POC NONE SEEN NONE SEEN   WBC, Wet Prep HPF POC MANY (A) NONE SEEN   Sperm NONE SEEN   Lipase, blood  Result Value Ref Range   Lipase 38 11 - 51 U/L  Comprehensive metabolic panel  Result Value Ref Range   Sodium 138 135 - 145 mmol/L   Potassium 3.9 3.5 - 5.1 mmol/L   Chloride 106 98 - 111 mmol/L   CO2 23 22 - 32 mmol/L   Glucose, Bld 104 (H) 70 - 99 mg/dL   BUN 17 6 - 20 mg/dL   Creatinine, Ser 8.11 0.44 - 1.00 mg/dL   Calcium 9.3 8.9 - 91.4 mg/dL   Total Protein 8.1 6.5 - 8.1 g/dL   Albumin 4.3 3.5 - 5.0 g/dL   AST 66 (H) 15 - 41 U/L   ALT 48 (H) 0 - 44 U/L   Alkaline Phosphatase 139 (H) 38 - 126 U/L   Total Bilirubin 0.3 0.3 - 1.2 mg/dL   GFR calc non Af Amer >60 >60 mL/min   GFR calc Af Amer >60 >60 mL/min   Anion gap 9 5 - 15  CBC  Result Value Ref Range   WBC 14.3 (H) 4.0 - 10.5 K/uL   RBC 4.85 3.87 - 5.11 MIL/uL   Hemoglobin 11.7 (L) 12.0 - 15.0 g/dL   HCT 78.2 95.6 - 21.3 %   MCV 78.4 78.0 - 100.0 fL   MCH 24.1 (L) 26.0 - 34.0 pg   MCHC 30.8 30.0 - 36.0 g/dL   RDW 08.6 (H) 57.8 - 46.9 %   Platelets 447 (H) 150 - 400 K/uL   Urinalysis, Routine w reflex microscopic  Result Value Ref Range   Color, Urine AMBER (A) YELLOW   APPearance CLOUDY (A) CLEAR   Specific Gravity, Urine 1.021 1.005 - 1.030   pH 5.0 5.0 - 8.0   Glucose, UA NEGATIVE NEGATIVE mg/dL   Hgb urine dipstick LARGE (A) NEGATIVE   Bilirubin Urine NEGATIVE NEGATIVE   Ketones, ur NEGATIVE NEGATIVE mg/dL   Protein, ur NEGATIVE NEGATIVE mg/dL   Nitrite NEGATIVE NEGATIVE   Leukocytes, UA LARGE (A) NEGATIVE   RBC / HPF >50 (H) 0 - 5 RBC/hpf   WBC, UA >50 (H) 0 - 5 WBC/hpf   Bacteria, UA RARE (A) NONE SEEN   Squamous Epithelial / LPF 0-5 0 - 5   WBC Clumps PRESENT   Pregnancy, urine  Result Value Ref Range   Preg Test, Ur NEGATIVE NEGATIVE  Differential  Result Value Ref Range   Neutrophils Relative % 73 %   Neutro Abs 10.6 (H) 1.7 - 7.7 K/uL   Lymphocytes Relative 20 %   Lymphs Abs 2.9 0.7 - 4.0 K/uL   Monocytes Relative 5 %   Monocytes Absolute 0.7 0.1 - 1.0 K/uL   Eosinophils Relative 2 %   Eosinophils Absolute 0.3 0.0 - 0.7 K/uL   Basophils Relative 0 %   Basophils Absolute 0.1 0.0 - 0.1 K/uL   Ct Abdomen Pelvis W Contrast Result Date: 05/01/2018 CLINICAL DATA:  Severe generalized abdominal pain EXAM: CT ABDOMEN AND PELVIS WITH CONTRAST TECHNIQUE: Multidetector CT imaging of the abdomen and pelvis was performed using the standard protocol following bolus administration of intravenous contrast. CONTRAST:  ISOVUE-300 IOPAMIDOL (ISOVUE-300) INJECTION 61% COMPARISON:  None. FINDINGS: Lower chest: No acute abnormality. Hepatobiliary: No focal liver abnormality is seen. No gallstones, gallbladder wall thickening, or biliary dilatation. Pancreas: Unremarkable. No pancreatic ductal dilatation or surrounding inflammatory changes.  Spleen: Choose Adrenals/Urinary Tract: Normal bilateral adrenal glands. Nonobstructing calculus in the upper pole the right kidney measuring approximately 2-3 mm. Punctate calculi in the interpolar left kidney are also  identified. Small extrarenal pelves are visualized. No obstructive uropathy is noted. Urinary bladder is nondistended and unremarkable for the degree of distention. Stomach/Bowel: Stomach is within normal limits. Appendix appears normal. No evidence of bowel wall thickening, distention, or inflammatory changes. Vascular/Lymphatic: No significant vascular findings are present. No enlarged abdominal or pelvic lymph nodes. Reproductive: Uterus and bilateral adnexa are unremarkable. Other: Small periumbilical fat containing hernia. Musculoskeletal: Small central disc-osteophyte complex L5-S1 with mild to moderate disc flattening. No acute osseous abnormality. IMPRESSION: No acute solid nor hollow visceral organ abnormality. Nonobstructing bilateral renal calculi. Electronically Signed   By: Tollie Eth M.D.   On: 05/01/2018 21:46   US Pelvis Complete Result Date: 05/02/2018 CLINICAL DATA:  Initial evaluation for acute pelvic pain. EXAM: TRANSABDOMINAL AND TRANSVAGINAL ULTRASOUND OF PELVIS DOPPLER ULTRASOUND OF OVARIES TECHNIQUE: Both transabdominal and transvaginal ultrasound examinations of the pelvis were performed. Transabdominal technique was performed for global imaging of the pelvis including uterus, ovaries, adnexal regions, and pelvic cul-de-sac. It was necessary to proceed with endovaginal exam following the transabdominal exam to visualize the uterus, endometrium, and ovaries. Color and duplex Doppler ultrasound was utilized to evaluate blood flow to the ovaries. COMPARISON:  Prior CT from earlier the same day. FINDINGS: Uterus Measurements: 7.7 x 3.6 x 4.4 cm. No fibroids or other mass visualized. Endometrium Thickness: 2.9 cm.  No focal abnormality visualized. Right ovary Measurements: 2.7 x 2.6 x 2.1 cm. Normal appearance/no adnexal mass. Left ovary Measurements: 2.4 x 1.7 x 2.0 cm. Normal appearance/no adnexal mass. Pulsed Doppler evaluation of the left ovary demonstrates normal low-resistance arterial  and venous waveforms. Normal venous waveform seen within the right ovary. Arterial Doppler waveform difficult to obtain within the right ovary, however, the grayscale appearance of the right ovary is normal, with no secondary signs to suggest torsion identified. Other findings No abnormal free fluid. IMPRESSION: Negative pelvic ultrasound.  No acute abnormality identified. Electronically Signed   By: Rise Mu M.D.   On: 05/02/2018 00:05    Results for TEALE, GOODGAME (MRN [id number]) as of 05/01/2018 22:05  Ref. Range 10/19/2017 16:48 10/22/2017 04:42 10/23/2017 13:37 10/24/2017 06:18 05/01/2018 19:39  AST Latest Ref Range: 15 - 41 U/L 65 (H) 98 (H) 110 (H) 91 (H) 66 (H)  ALT Latest Ref Range: 0 - 44 U/L 28 53 81 (H) 68 (H) 48 (H)     0020:  LFT's per baseline. Will tx for UTI while GC/chlam pending. Pt has tol PO well while in the ED without N/V. Pt ready to go home now.  Dx and testing d/w pt and family.  Questions answered.  Verb understanding, agreeable to d/c home with outpt f/u.    Final Clinical Impressions(s) / ED Diagnoses   Final diagnoses:  None    ED Discharge Orders    None       Samuel Jester, DO 05/02/18 2142

## 2018-05-01 NOTE — ED Triage Notes (Signed)
Pt c/o severe generalized abdominal pain that started today suddenly; pt states she has nausea

## 2018-05-02 MED ORDER — CEPHALEXIN 500 MG PO CAPS: 500.0000 mg | ORAL_CAPSULE | Freq: Four times a day (QID) | ORAL | 0 refills | Status: DC

## 2018-05-02 NOTE — Discharge Instructions (Addendum)
Take the prescription as directed. Take over the counter tylenol and ibuprofen, as directed on packaging, as needed for discomfort.  Apply moist heat to the area(s) of discomfort, for 15 minutes at a time, several times per day for the next few days.  Do not fall asleep on a heating pack.  Call your regular OB/GYN doctor tomorrow to schedule a follow up appointment within the next week.  Return to the Emergency Department immediately if worsening.

## 2018-05-05 ENCOUNTER — Other Ambulatory Visit: Payer: Self-pay

## 2018-05-05 ENCOUNTER — Ambulatory Visit (INDEPENDENT_AMBULATORY_CARE_PROVIDER_SITE_OTHER): Payer: 59 | Admitting: Adult Health

## 2018-05-05 ENCOUNTER — Encounter: Payer: Self-pay | Admitting: Adult Health

## 2018-05-05 VITALS — BP 114/67 | HR 73 | Ht 68.0 in | Wt 184.0 lb

## 2018-05-05 DIAGNOSIS — R748 Abnormal levels of other serum enzymes: Secondary | ICD-10-CM

## 2018-05-05 DIAGNOSIS — R102 Pelvic and perineal pain: Secondary | ICD-10-CM

## 2018-05-05 DIAGNOSIS — N200 Calculus of kidney: Secondary | ICD-10-CM

## 2018-05-05 LAB — POCT URINALYSIS DIPSTICK OB
Glucose, UA: NEGATIVE
KETONES UA: NEGATIVE
Nitrite, UA: NEGATIVE
POC,PROTEIN,UA: NEGATIVE
RBC UA: NEGATIVE

## 2018-05-05 LAB — GC/CHLAMYDIA PROBE AMP (~~LOC~~) NOT AT ARMC
Chlamydia: NEGATIVE
NEISSERIA GONORRHEA: NEGATIVE

## 2018-05-05 MED ORDER — PROMETHAZINE HCL 25 MG PO TABS: 25.0000 mg | ORAL_TABLET | Freq: Four times a day (QID) | ORAL | 1 refills | Status: DC | PRN

## 2018-05-05 MED ORDER — KETOROLAC TROMETHAMINE 10 MG PO TABS: 10.0000 mg | ORAL_TABLET | Freq: Four times a day (QID) | ORAL | 0 refills | Status: DC | PRN

## 2018-05-05 NOTE — Patient Instructions (Signed)

## 2018-05-05 NOTE — Progress Notes (Signed)
  Subjective:     Patient ID: Melanie Price, female   DOB: 1991-04-01, 27 y.o.   MRN: [id number]  HPI Melanie Price is a 27 year old white female, in for ER F/U, was seen 9/26 at [facility] and was treated for UTI. Labs showed elevated WBC and platelets and liver enzymes and CT showed small non obstructing bilateral kidney stones.   Review of Systems +cramping at end of urine stream +nausea Denies fever or vomiting Reviewed past medical,surgical, social and family history. Reviewed medications and allergies.     Objective:   Physical Exam BP 114/67 (BP Location: Right Arm, Patient Position: Sitting, Cuff Size: Normal)   Pulse 73   Ht 5\' 8"  (1.727 m)   Wt 184 lb (83.5 kg)   BMI 27.98 kg/m urine +leuks.Skin warm and dry. Lungs: clear to ausculation bilaterally. Cardiovascular: regular rate and rhythm.Abdomen is soft, RUQ tenderness and tender over bladder area.No CVAT.    Assessment:     1. Pelvic pain   2. Kidney stones   3. Elevated liver enzymes       Plan:     UA C&S  Check CBC and CMP Continue Keflex Push water and apple or cranberry juice or lemonade  Meds ordered this encounter  Medications  . promethazine (PHENERGAN) 25 MG tablet    Sig: Take 1 tablet (25 mg total) by mouth every 6 (six) hours as needed for nausea or vomiting.    Dispense:  30 tablet    Refill:  1    Order Specific Question:   Supervising Provider    Answer:   Despina Hidden, LUTHER H [[id number]]  . ketorolac (TORADOL) 10 MG tablet    Sig: Take 1 tablet (10 mg total) by mouth every 6 (six) hours as needed.    Dispense:  20 tablet    Refill:  0    Order Specific Question:   Supervising Provider    Answer:   Despina Hidden, LUTHER H [[id number]]  F/U in 4 days

## 2018-05-06 ENCOUNTER — Telehealth: Payer: Self-pay | Admitting: Adult Health

## 2018-05-06 LAB — COMPREHENSIVE METABOLIC PANEL
ALK PHOS: 169 IU/L — AB (ref 39–117)
ALT: 61 IU/L — ABNORMAL HIGH (ref 0–32)
AST: 81 IU/L — AB (ref 0–40)
Albumin/Globulin Ratio: 1.7 (ref 1.2–2.2)
Albumin: 4.7 g/dL (ref 3.5–5.5)
BILIRUBIN TOTAL: 0.3 mg/dL (ref 0.0–1.2)
BUN/Creatinine Ratio: 16 (ref 9–23)
BUN: 13 mg/dL (ref 6–20)
CHLORIDE: 102 mmol/L (ref 96–106)
CO2: 22 mmol/L (ref 20–29)
CREATININE: 0.79 mg/dL (ref 0.57–1.00)
Calcium: 9.8 mg/dL (ref 8.7–10.2)
GFR calc Af Amer: 119 mL/min/{1.73_m2} (ref >59)
GFR calc non Af Amer: 104 mL/min/{1.73_m2} (ref >59)
GLUCOSE: 85 mg/dL (ref 65–99)
Globulin, Total: 2.8 g/dL (ref 1.5–4.5)
Potassium: 4.4 mmol/L (ref 3.5–5.2)
SODIUM: 139 mmol/L (ref 134–144)
Total Protein: 7.5 g/dL (ref 6.0–8.5)

## 2018-05-06 LAB — URINALYSIS, ROUTINE W REFLEX MICROSCOPIC
BILIRUBIN UA: NEGATIVE
GLUCOSE, UA: NEGATIVE
KETONES UA: NEGATIVE
Nitrite, UA: POSITIVE — AB
PROTEIN UA: NEGATIVE
RBC, UA: NEGATIVE
SPEC GRAV UA: 1.02 (ref 1.005–1.030)
Urobilinogen, Ur: 0.2 mg/dL (ref 0.2–1.0)
pH, UA: 5.5 (ref 5.0–7.5)

## 2018-05-06 LAB — CBC
HEMATOCRIT: 35.9 % (ref 34.0–46.6)
Hemoglobin: 10.8 g/dL — ABNORMAL LOW (ref 11.1–15.9)
MCH: 23.2 pg — ABNORMAL LOW (ref 26.6–33.0)
MCHC: 30.1 g/dL — AB (ref 31.5–35.7)
MCV: 77 fL — ABNORMAL LOW (ref 79–97)
Platelets: 498 10*3/uL — ABNORMAL HIGH (ref 150–450)
RBC: 4.65 x10E6/uL (ref 3.77–5.28)
RDW: 15.5 % — AB (ref 12.3–15.4)
WBC: 11 10*3/uL — ABNORMAL HIGH (ref 3.4–10.8)

## 2018-05-06 LAB — MICROSCOPIC EXAMINATION
Casts: NONE SEEN /lpf
Epithelial Cells (non renal): >10 /hpf — AB (ref 0–10)

## 2018-05-06 NOTE — Telephone Encounter (Signed)
Left message to call about labs 

## 2018-05-06 NOTE — Telephone Encounter (Signed)
Pt says she feels a little better, and is aware of labs, will see her Friday 10/4

## 2018-05-07 LAB — URINE CULTURE

## 2018-05-09 ENCOUNTER — Ambulatory Visit (INDEPENDENT_AMBULATORY_CARE_PROVIDER_SITE_OTHER): Payer: 59 | Admitting: Adult Health

## 2018-05-09 ENCOUNTER — Encounter: Payer: Self-pay | Admitting: Adult Health

## 2018-05-09 VITALS — BP 100/60 | HR 79 | Ht 68.0 in | Wt 185.0 lb

## 2018-05-09 DIAGNOSIS — N200 Calculus of kidney: Secondary | ICD-10-CM

## 2018-05-09 DIAGNOSIS — R1011 Right upper quadrant pain: Secondary | ICD-10-CM | POA: Diagnosis not present

## 2018-05-09 DIAGNOSIS — R748 Abnormal levels of other serum enzymes: Secondary | ICD-10-CM

## 2018-05-09 NOTE — Progress Notes (Signed)
  Subjective:     Patient ID: Melanie Price, female   DOB: 20-Oct-1990, 27 y.o.   MRN: [id number]  HPI Melanie Price is a 27 year old white female, back in follow of being treated for UTI and having RUQ and feels better, but still with RUQ and now into back. Her liver enzymes are elevated.  Review of Systems Feels better but has pain RUQ and into back Reviewed past medical,surgical, social and family history. Reviewed medications and allergies.     Objective:   Physical Exam BP 100/60 (BP Location: Left Arm, Patient Position: Sitting, Cuff Size: Normal)   Pulse 79   Ht 5\' 8"  (1.727 m)   Wt 185 lb (83.9 kg)   BMI 28.13 kg/m Skin warm and dry abdomen is soft and has tenderness/pain RUQ and some right CVAT. Will recheck CBC,CMP and hepatitis panel, and get [place] to assess liver and GB.     Assessment:     1. RUQ pain   2. Elevated liver enzymes   3. Kidney stones       Plan:    Finish Keflex  Check CBC, CMP and hepatis panel  Abdomen complete US 10/9 at 7:30 at [facility] Will talk when results back Push fluids F/U prn Has Toradol and phenergan if needed

## 2018-05-10 LAB — COMPREHENSIVE METABOLIC PANEL
A/G RATIO: 1.8 (ref 1.2–2.2)
ALK PHOS: 156 IU/L — AB (ref 39–117)
ALT: 49 IU/L — AB (ref 0–32)
AST: 59 IU/L — AB (ref 0–40)
Albumin: 4.8 g/dL (ref 3.5–5.5)
BUN/Creatinine Ratio: 12 (ref 9–23)
BUN: 10 mg/dL (ref 6–20)
Bilirubin Total: 0.2 mg/dL (ref 0.0–1.2)
CHLORIDE: 102 mmol/L (ref 96–106)
CO2: 22 mmol/L (ref 20–29)
Calcium: 9.8 mg/dL (ref 8.7–10.2)
Creatinine, Ser: 0.82 mg/dL (ref 0.57–1.00)
GFR calc Af Amer: 114 mL/min/{1.73_m2} (ref >59)
GFR calc non Af Amer: 99 mL/min/{1.73_m2} (ref >59)
Globulin, Total: 2.7 g/dL (ref 1.5–4.5)
Glucose: 86 mg/dL (ref 65–99)
POTASSIUM: 4.5 mmol/L (ref 3.5–5.2)
Sodium: 139 mmol/L (ref 134–144)
Total Protein: 7.5 g/dL (ref 6.0–8.5)

## 2018-05-10 LAB — HEPATITIS PANEL, ACUTE
HEP A IGM: NEGATIVE
HEP B C IGM: NEGATIVE
HEP B S AG: NEGATIVE
Hep C Virus Ab: <0.1 s/co ratio (ref 0.0–0.9)

## 2018-05-10 LAB — CBC
Hematocrit: 36.2 % (ref 34.0–46.6)
Hemoglobin: 11.4 g/dL (ref 11.1–15.9)
MCH: 23.8 pg — ABNORMAL LOW (ref 26.6–33.0)
MCHC: 31.5 g/dL (ref 31.5–35.7)
MCV: 76 fL — AB (ref 79–97)
PLATELETS: 495 10*3/uL — AB (ref 150–450)
RBC: 4.78 x10E6/uL (ref 3.77–5.28)
RDW: 15.5 % — AB (ref 12.3–15.4)
WBC: 9.2 10*3/uL (ref 3.4–10.8)

## 2018-05-14 ENCOUNTER — Ambulatory Visit (HOSPITAL_COMMUNITY)
Admission: RE | Admit: 2018-05-14 | Discharge: 2018-05-14 | Disposition: A | Discharge status: Home / Self Care | Payer: 59 | Source: Ambulatory Visit | Attending: Adult Health | Admitting: Adult Health

## 2018-05-14 ENCOUNTER — Telehealth: Payer: Self-pay | Admitting: *Deleted

## 2018-05-14 DIAGNOSIS — R1011 Right upper quadrant pain: Secondary | ICD-10-CM

## 2018-05-14 DIAGNOSIS — K760 Fatty (change of) liver, not elsewhere classified: Secondary | ICD-10-CM

## 2018-05-14 DIAGNOSIS — R748 Abnormal levels of other serum enzymes: Secondary | ICD-10-CM

## 2018-05-15 ENCOUNTER — Encounter: Payer: Self-pay | Admitting: *Deleted

## 2018-05-15 DIAGNOSIS — K760 Fatty (change of) liver, not elsewhere classified: Secondary | ICD-10-CM

## 2018-05-15 HISTORY — DX: Fatty (change of) liver, not elsewhere classified: K76.0

## 2018-05-15 NOTE — Telephone Encounter (Signed)
Pt aware of [place] results with mild fatty liver, feels better but some mild RUQ pain still, will refer to GI

## 2018-05-22 ENCOUNTER — Encounter: Payer: Self-pay | Admitting: Internal Medicine

## 2018-06-24 ENCOUNTER — Encounter: Payer: Self-pay | Admitting: Nurse Practitioner

## 2018-06-24 ENCOUNTER — Ambulatory Visit (INDEPENDENT_AMBULATORY_CARE_PROVIDER_SITE_OTHER): Payer: 59 | Admitting: Nurse Practitioner

## 2018-06-24 VITALS — BP 121/64 | HR 80 | Temp 97.2°F | Ht 68.0 in | Wt 190.8 lb

## 2018-06-24 DIAGNOSIS — K760 Fatty (change of) liver, not elsewhere classified: Secondary | ICD-10-CM | POA: Diagnosis not present

## 2018-06-24 DIAGNOSIS — R748 Abnormal levels of other serum enzymes: Secondary | ICD-10-CM | POA: Diagnosis not present

## 2018-06-24 DIAGNOSIS — R1011 Right upper quadrant pain: Secondary | ICD-10-CM

## 2018-06-24 LAB — COMPREHENSIVE METABOLIC PANEL
AG Ratio: 1.5 (calc) (ref 1.0–2.5)
ALKALINE PHOSPHATASE (APISO): 119 U/L — AB (ref 33–115)
ALT: 33 U/L — ABNORMAL HIGH (ref 6–29)
AST: 48 U/L — AB (ref 10–30)
Albumin: 4.2 g/dL (ref 3.6–5.1)
BILIRUBIN TOTAL: 0.3 mg/dL (ref 0.2–1.2)
BUN: 13 mg/dL (ref 7–25)
CHLORIDE: 106 mmol/L (ref 98–110)
CO2: 27 mmol/L (ref 20–32)
Calcium: 9.4 mg/dL (ref 8.6–10.2)
Creat: 0.76 mg/dL (ref 0.50–1.10)
GLUCOSE: 84 mg/dL (ref 65–99)
Globulin: 2.8 g/dL (calc) (ref 1.9–3.7)
Potassium: 4.5 mmol/L (ref 3.5–5.3)
Sodium: 139 mmol/L (ref 135–146)
TOTAL PROTEIN: 7 g/dL (ref 6.1–8.1)

## 2018-06-24 LAB — GAMMA GT: GGT: 46 U/L — AB (ref 3–40)

## 2018-06-24 LAB — LIPID PANEL
CHOLESTEROL: 171 mg/dL (ref <200)
HDL: 35 mg/dL — ABNORMAL LOW (ref >50)
LDL CHOLESTEROL (CALC): 109 mg/dL — AB
Non-HDL Cholesterol (Calc): 136 mg/dL (calc) — ABNORMAL HIGH (ref <130)
TRIGLYCERIDES: 152 mg/dL — AB (ref <150)
Total CHOL/HDL Ratio: 4.9 (calc) (ref <5.0)

## 2018-06-24 NOTE — Patient Instructions (Addendum)
1. Have your labs drawn when you are able to. 2. As we discussed, diet and exercise are the best treatments for fatty liver disease. 3. Your long-term weight goal is between 160 and 170 pounds. 4. We will refer you to a dietitian for diet assistance. 5. We will give you a list of area primary care providers to help you find and establish with a primary care. 6. Return for follow-up in 6 months. 7. Call us if you have any questions or concerns.  At [facility] we value your feedback. You may receive a survey about your visit today. Please share your experience as we strive to create trusting relationships with our patients to provide genuine, compassionate, quality care.  We appreciate your understanding and patience as we review any laboratory studies, imaging, and other diagnostic tests that are ordered as we care for you. Our office policy is 5 business days for review of these results, and any emergent or urgent results are addressed in a timely manner for your best interest. If you do not hear from our office in 1 week, please contact us.   We also encourage the use of MyChart, which contains your medical information for your review as well. If you are not enrolled in this feature, an access code is on this after visit summary for your convenience. Thank you for allowing [place] to be involved in your care.  It was great to meet you today!  I hope you have a Happy Thanksgiving!!      Fatty Liver Fatty liver, also called hepatic steatosis or steatohepatitis, is a condition in which too much fat has built up in your liver cells. The liver removes harmful substances from your bloodstream. It produces fluids your body needs. It also helps your body use and store energy from the food you eat. In many cases, fatty liver does not cause symptoms or problems. It is often diagnosed when tests are being done for other reasons. However, over time, fatty liver can cause inflammation that may  lead to more serious liver problems, such as scarring of the liver (cirrhosis). What are the causes? Causes of fatty liver may include:  Drinking too much alcohol.  Poor nutrition.  Obesity.  Cushing syndrome.  Diabetes.  Hyperlipidemia.  Pregnancy.  Certain drugs.  Poisons.  Some viral infections.  What increases the risk? You may be more likely to develop fatty liver if you:  Abuse alcohol.  Are pregnant.  Are overweight.  Have diabetes.  Have hepatitis.  Have a high triglyceride level.  What are the signs or symptoms? Fatty liver often does not cause any symptoms. In cases where symptoms develop, they can include:  Fatigue.  Weakness.  Weight loss.  Confusion.  Abdominal pain.  Yellowing of your skin and the white parts of your eyes (jaundice).  Nausea and vomiting.  How is this diagnosed? Fatty liver may be diagnosed by:  Physical exam and medical history.  Blood tests.  Imaging tests, such as an ultrasound, CT scan, or MRI.  Liver biopsy. A small sample of liver tissue is removed using a needle. The sample is then looked at under a microscope.  How is this treated? Fatty liver is often caused by other health conditions. Treatment for fatty liver may involve medicines and lifestyle changes to manage conditions such as:  Alcoholism.  High cholesterol.  Diabetes.  Being overweight or obese.  Follow these instructions at home:  Eat a healthy diet as directed by your health  care provider.  Exercise regularly. This can help you lose weight and control your cholesterol and diabetes. Talk to your health care provider about an exercise plan and which activities are best for you.  Do not drink alcohol.  Take medicines only as directed by your health care provider. Contact a health care provider if: You have difficulty controlling your:  Blood sugar.  Cholesterol.  Alcohol consumption.  Get help right away if:  You have  abdominal pain.  You have jaundice.  You have nausea and vomiting. This information is not intended to replace advice given to you by your health care provider. Make sure you discuss any questions you have with your health care provider. Document Released: 09/07/2005 Document Revised: 12/29/2015 Document Reviewed: 12/02/2013 Elsevier Interactive Patient Education  [employer]2018 [employer]

## 2018-06-24 NOTE — Progress Notes (Signed)
Primary Care Physician:  Patient, No Pcp Per Primary Gastroenterologist:  Dr. Jena Gaussourk   Chief Complaint  Patient presents with  . Abdominal Pain    ruq, none now  . Heartburn    sometimes has chest pain    HPI:   Melanie Price is a 27 y.o. female who presents on referral from primary care for fatty liver and right upper quadrant pain.  Reviewed information provided with a referral including office visit dated 05/09/2018 for right upper quadrant pain, elevated liver enzymes.  Notes persistent right upper quadrant pain, elevated LFTs.  Recommend to check CBC, CMP, hepatitis panel as well as abdominal ultrasound.  Labs completed 05/09/2018 which found no leukocytosis or anemia.  CMP found elevated AST/ALT of 59/49 which is improved compared to previous values.  Alkaline phosphatase elevated at 156, also improved compared to previous.  Hepatitis panel negative.  Abdominal ultrasound completed 05/14/2018 found mild fatty infiltration of the liver, otherwise negative.  No explanation for pain.  Recommended referral to GI.  Today she states she's doing ok overall. RUQ pain started 3 months ago and was described as crampy. Pain has resolved at this point. Denies history of diabetes in herself or family. Does have family history of liver disease due to drug abuse/ETOH abuse. Denies abdominal pain. Has "tight, gripping" chest pain intermittently which varies from a couple times a week to once a year. Denies other GERD symptoms. Has rare heartburn symptoms, which are different from her chest pain. Hasn't told her OBGYN about her chest pain; doesn't have a PCP. Denies N/V, hematochezia, melena, fever, chills, unintentional weight loss. Denies chest pain, dyspnea, dizziness, lightheadedness, syncope, near syncope. Denies any other upper or lower GI symptoms. Denies jaundice and darkened urine. Has previously been told she has IBS. Has "stomach issues" but isn't able to elaborate specific symptoms. Denies  dyspnea, dizziness, lightheadedness, syncope, near syncope. Denies any other upper or lower GI symptoms.  Past Medical History:  Diagnosis Date  . Anemia   . Bleeding in early pregnancy 12/19/2015  . Contraceptive management 09/26/2015  . Depression    post-partum depression  . Encounter for Nexplanon removal 09/26/2015  . Fatty liver 05/15/2018   Refer to GI   . Headache    migraine  . Miscarriage 12/21/2015  . Preeclampsia   . Preeclampsia     Past Surgical History:  Procedure Laterality Date  . NO PAST SURGERIES      Current Outpatient Medications  Medication Sig Dispense Refill  . escitalopram (LEXAPRO) 10 MG tablet Take 1 tablet (10 mg total) by mouth daily. 30 tablet 6  . etonogestrel (NEXPLANON) 68 MG IMPL implant 1 each by Subdermal route once.    . ferrous sulfate 325 (65 FE) MG tablet Take 1 tablet (325 mg total) by mouth 2 (two) times daily with a meal. (Patient taking differently: Take 325 mg by mouth daily with breakfast. ) 60 tablet 3  . ibuprofen (ADVIL,MOTRIN) 600 MG tablet Take 1 tablet (600 mg total) by mouth 4 (four) times daily. (Patient taking differently: Take 600 mg by mouth daily as needed (for migraine pain). ) 30 tablet 0   No current facility-administered medications for this visit.     Allergies as of 06/24/2018  . (No Known Allergies)    Family History  Problem Relation Age of Onset  . Cancer Maternal Grandfather        skin  . Pyloric stenosis Son   . Irritable bowel syndrome Paternal Aunt   .  Crohn's disease Paternal Aunt   . Heart disease Maternal Aunt   . Heart disease Maternal Uncle   . Hepatitis Maternal Uncle        viral    Social History   Socioeconomic History  . Marital status: Married    Spouse name: Not on file  . Number of children: Not on file  . Years of education: Not on file  . Highest education level: Not on file  Occupational History  . Not on file  Social Needs  . Financial resource strain: Not on file  .  Food insecurity:    Worry: Not on file    Inability: Not on file  . Transportation needs:    Medical: Not on file    Non-medical: Not on file  Tobacco Use  . Smoking status: Never Smoker  . Smokeless tobacco: Never Used  Substance and Sexual Activity  . Alcohol use: No  . Drug use: No  . Sexual activity: Not Currently    Birth control/protection: Implant  Lifestyle  . Physical activity:    Days per week: Not on file    Minutes per session: Not on file  . Stress: Not on file  Relationships  . Social connections:    Talks on phone: Not on file    Gets together: Not on file    Attends religious service: Not on file    Active member of club or organization: Not on file    Attends meetings of clubs or organizations: Not on file    Relationship status: Not on file  . Intimate partner violence:    Fear of current or ex partner: Not on file    Emotionally abused: Not on file    Physically abused: Not on file    Forced sexual activity: Not on file  Other Topics Concern  . Not on file  Social History Narrative  . Not on file    Review of Systems: General: Negative for anorexia, weight loss, fever, chills, fatigue, weakness. ENT: Negative for hoarseness, difficulty swallowing. CV: Negative for chest pain, angina, palpitations, peripheral edema.  Respiratory: Negative for dyspnea at rest, cough, sputum, wheezing.  GI: See history of present illness. MS: Negative for joint pain, low back pain.  Derm: Negative for rash or itching.  Endo: Negative for unusual weight change.  Heme: Negative for bruising or bleeding. Allergy: Negative for rash or hives.    Physical Exam: BP 121/64   Pulse 80   Temp (!) 97.2 F (36.2 C) (Oral)   Ht 5\' 8"  (1.727 m)   Wt 190 lb 12.8 oz (86.5 kg)   LMP 06/03/2018 (Approximate)   BMI 29.01 kg/m  General:   Alert and oriented. Pleasant and cooperative. Well-nourished and well-developed.  Eyes:  Without icterus, sclera clear and conjunctiva  pink.  Ears:  Normal auditory acuity. Cardiovascular:  S1, S2 present without murmurs appreciated. Extremities without clubbing or edema. Respiratory:  Clear to auscultation bilaterally. No wheezes, rales, or rhonchi. No distress.  Gastrointestinal:  +BS, soft, and non-distended. Mild RUQ TTP. No HSM noted. No guarding or rebound. No masses appreciated.  Rectal:  Deferred  Musculoskalatal:  Symmetrical without gross deformities. Neurologic:  Alert and oriented x4;  grossly normal neurologically. Psych:  Alert and cooperative. Normal mood and affect. Heme/Lymph/Immune: No excessive bruising noted.    06/24/2018 10:47 AM   Disclaimer: This note was dictated with voice recognition software. Similar sounding words can inadvertently be transcribed and may not be corrected upon  review.

## 2018-06-27 NOTE — Assessment & Plan Note (Signed)
Increased LFTs and U/S showing fatty liver disease. Very mild elevation in AST/ALT. She is borderline obese. Recommended diet, exercise, long-term weight goal of 160-170 lbs, refer to dietician. Check labs including CMP, GGT, Lipid panel. Follow-up in 6 months and consider other serologic testing.

## 2018-06-27 NOTE — Assessment & Plan Note (Signed)
Intermittent right upper quadrant pain which is currently abated.  Likely related to fatty liver disease.  Treatment of fatty liver disease focusing on diet and exercise.  We offered a dietitian referral and the patient accepted.  Fasting labs, refer to primary care provider to establish care.  Follow-up in 6 months.

## 2018-06-27 NOTE — Assessment & Plan Note (Signed)
Mild elevation of AST/ALT at 59/49 which is improved compared to previous values.  Also with elevated alkaline phosphatase 156, also improved.  Hepatitis panel is negative.  We will recheck fasting CMP, GGT, lipid panel.  Likely fatty liver disease as per imaging.  When she follows up we can consider further serologic testing for autoimmune diseases and iron overload, serial plasmin.  Follow-up in 6 months.  We will refer her to local primary care providers to establish care.

## 2018-06-30 NOTE — Progress Notes (Signed)
No pcp per patient 

## 2018-08-25 ENCOUNTER — Ambulatory Visit (INDEPENDENT_AMBULATORY_CARE_PROVIDER_SITE_OTHER): Payer: 59 | Admitting: Women's Health

## 2018-08-25 ENCOUNTER — Encounter: Payer: Self-pay | Admitting: Women's Health

## 2018-08-25 VITALS — BP 116/60 | HR 81 | Ht 68.0 in | Wt 190.5 lb

## 2018-08-25 DIAGNOSIS — R350 Frequency of micturition: Secondary | ICD-10-CM | POA: Diagnosis not present

## 2018-08-25 LAB — POCT URINALYSIS DIPSTICK
Blood, UA: NEGATIVE
GLUCOSE UA: NEGATIVE
KETONES UA: NEGATIVE
Nitrite, UA: NEGATIVE
Protein, UA: NEGATIVE

## 2018-08-25 LAB — GLUCOSE, POCT (MANUAL RESULT ENTRY): POC Glucose: 85 mg/dl (ref 70–99)

## 2018-08-25 NOTE — Progress Notes (Signed)
   GYN VISIT Patient name: Melanie Price MRN [id number]  Date of birth: 10/12/1990 Chief Complaint:   having frequent urination (pain in low back)  History of Present Illness:   Melanie SkinnerKayla C Zecca is a 28 y.o. [id number] Caucasian female being seen today for report of frequent urination x 1 month, getting worse, sometimes voids q 15mins, normal amounts- not just dribble. No dysuria, hematuria, etc. Has had uti's in past- doesn't feel like that. Doesn't drink caffeine. No smoking/etoh. No family h/o DM. No change in sex partners. Denies abnormal discharge, itching/odor/irritation.       No LMP recorded. Patient has had an implant. The current method of family planning is nexplanon. Last pap 11/20/16. Results were:  normal Review of Systems:   Pertinent items are noted in HPI Denies fever/chills, dizziness, headaches, visual disturbances, fatigue, shortness of breath, chest pain, abdominal pain, vomiting, abnormal vaginal discharge/itching/odor/irritation, problems with periods, bowel movements, urination, or intercourse unless otherwise stated above.  Pertinent History Reviewed:  Reviewed past medical,surgical, social, obstetrical and family history.  Reviewed problem list, medications and allergies. Physical Assessment:   Vitals:   08/25/18 1108  BP: 116/60  Pulse: 81  Weight: 190 lb 8 oz (86.4 kg)  Height: 5\' 8"  (1.727 m)  Body mass index is 28.97 kg/m.       Physical Examination:   General appearance: alert, well appearing, and in no distress  Mental status: alert, oriented to person, place, and time  Skin: warm & dry   Cardiovascular: normal heart rate noted  Respiratory: normal respiratory effort, no distress  Abdomen: soft, non-tender   Pelvic: examination not indicated  Extremities: no edema   Results for orders placed or performed in visit on 08/25/18 (from the past 24 hour(s))  POCT Urinalysis Dipstick   Collection Time: 08/25/18 11:12 AM  Result Value Ref Range   Color, UA      Clarity, UA     Glucose, UA Negative Negative   Bilirubin, UA     Ketones, UA neg    Spec Grav, UA     Blood, UA neg    pH, UA     Protein, UA Negative Negative   Urobilinogen, UA     Nitrite, UA neg    Leukocytes, UA Trace (A) Negative   Appearance     Odor    POCT glucose (manual entry)   Collection Time: 08/25/18 12:10 PM  Result Value Ref Range   POC Glucose 85 70 - 99 mg/dl    Assessment & Plan:  1) Urinary frequency> dipstick + for tr leuks only, will send cx. No blood- low suspicion for stone. Fasting CBG 85, so not likely DM. Will also send gc/ct. If both neg, discussed possibility of IC, will call pt w/ results and POC  Meds: No orders of the defined types were placed in this encounter.   Orders Placed This Encounter  Procedures  . Urine Culture  . GC/Chlamydia Probe Amp  . POCT Urinalysis Dipstick  . POCT glucose (manual entry)    Return for will call pt.  Cheral MarkerKimberly R Veona Bittman CNM, [facility]NP-BC 08/25/2018 12:19 PM

## 2018-08-27 LAB — URINE CULTURE

## 2018-08-28 LAB — GC/CHLAMYDIA PROBE AMP
Chlamydia trachomatis, NAA: NEGATIVE
Neisseria gonorrhoeae by PCR: NEGATIVE

## 2018-08-29 ENCOUNTER — Other Ambulatory Visit: Payer: Self-pay | Admitting: Women's Health

## 2018-08-29 MED ORDER — SOLIFENACIN SUCCINATE 10 MG PO TABS: 10.0000 mg | ORAL_TABLET | Freq: Every day | ORAL | 0 refills | Status: DC

## 2018-09-01 ENCOUNTER — Telehealth: Payer: Self-pay | Admitting: Women's Health

## 2018-09-01 MED ORDER — MEGESTROL ACETATE 40 MG PO TABS: ORAL_TABLET | ORAL | 1 refills | Status: DC

## 2018-09-01 NOTE — Telephone Encounter (Signed)
Spoke with pt. Pt is on Nexplanon. Pt has been prescribed Megace in the past for bleeding. Pt is requesting more Megace. Pt states it has probably been a year since she was on Megace. Please advise. Thanks!! JSY

## 2018-09-01 NOTE — Telephone Encounter (Signed)
Pt requesting to get something called in to stop the bleeding on nexplanon/ she said we done before/ pt uses [facility] in [place]. Please advise pt

## 2018-09-12 ENCOUNTER — Other Ambulatory Visit: Payer: Self-pay | Admitting: Women's Health

## 2018-09-24 ENCOUNTER — Other Ambulatory Visit: Payer: Self-pay | Admitting: Women's Health

## 2018-09-25 ENCOUNTER — Ambulatory Visit: Payer: 59 | Admitting: Women's Health

## 2018-09-30 ENCOUNTER — Encounter: Payer: Self-pay | Admitting: Adult Health

## 2018-09-30 ENCOUNTER — Ambulatory Visit (INDEPENDENT_AMBULATORY_CARE_PROVIDER_SITE_OTHER): Payer: 59 | Admitting: Adult Health

## 2018-09-30 VITALS — BP 116/50 | HR 75 | Ht 68.0 in | Wt 194.0 lb

## 2018-09-30 DIAGNOSIS — R350 Frequency of micturition: Secondary | ICD-10-CM

## 2018-09-30 LAB — POCT URINALYSIS DIPSTICK
Blood, UA: NEGATIVE
Glucose, UA: NEGATIVE
KETONES UA: NEGATIVE
NITRITE UA: NEGATIVE
Protein, UA: NEGATIVE

## 2018-09-30 MED ORDER — MIRABEGRON ER 25 MG PO TB24: 25.0000 mg | ORAL_TABLET | Freq: Every day | ORAL | 0 refills | Status: DC

## 2018-09-30 NOTE — Progress Notes (Signed)
Patient ID: Melanie Price, female   DOB: 1990/11/18, 28 y.o.   MRN: [id number] History of Present Illness: Melanie Price is a 28 year old white female, back in follow up on urinary frequency,still peeing about every 30 minutes, normal amount, no pain or burning and did not get vesicare was too expensive.    Current Medications, Allergies, Past Medical History, Past Surgical History, Family History and Social History were reviewed in [name] record.     Review of Systems: +urinary frequency, pees like every 30 minutes, normal amount Denies any pain or burning  Did not get vesicare was $45     Physical Exam:BP (!) 116/50 (BP Location: Left Arm, Patient Position: Sitting, Cuff Size: Normal)   Pulse 75   Ht 5\' 8"  (1.727 m)   Wt 194 lb (88 kg)   BMI 29.50 kg/m Urine dipstick, 1+ leuks. General:  Well developed, well nourished, no acute distress Skin:  Warm and dry. Bladder is non tender. Psych:  No mood changes, alert and cooperative,seems happy Will try Myrbetriq, sounds like OAB, gave samples of 25 mg tablets to try.   Impression: 1. Urinary frequency       Plan: Meds ordered this encounter  Medications  . mirabegron ER (MYRBETRIQ) 25 MG TB24 tablet    Sig: Take 1 tablet (25 mg total) by mouth daily.    Dispense:  35 tablet    Refill:  0    Order Specific Question:   Supervising Provider    Answer:   Despina Hidden, LUTHER H [[id number]]  F/U in 3 weeks with me

## 2018-10-01 ENCOUNTER — Other Ambulatory Visit: Payer: Self-pay | Admitting: Women's Health

## 2018-10-10 ENCOUNTER — Other Ambulatory Visit: Payer: Self-pay | Admitting: Women's Health

## 2018-10-10 MED ORDER — MEGESTROL ACETATE 40 MG PO TABS: ORAL_TABLET | ORAL | 3 refills | Status: DC

## 2018-10-21 ENCOUNTER — Ambulatory Visit: Payer: Self-pay | Admitting: Adult Health

## 2018-10-27 ENCOUNTER — Telehealth: Payer: Self-pay | Admitting: *Deleted

## 2018-10-27 NOTE — Telephone Encounter (Signed)
Patient informed no visitors are allowed back during her visit. Screening questions asked.   At this time, have come in contact with someone in the last month that has been confirmed or suspected of having Covid-19?no   Are you experiencing a fever, cough, SOB, muscle pain, diarrhea, rash, vomiting, abdominal pain, red eye, weakness, bruising or bleeding, joint pain or severe headache?no 

## 2018-10-28 ENCOUNTER — Encounter: Payer: Self-pay | Admitting: Adult Health

## 2018-10-28 ENCOUNTER — Other Ambulatory Visit: Payer: Self-pay

## 2018-10-28 ENCOUNTER — Ambulatory Visit (INDEPENDENT_AMBULATORY_CARE_PROVIDER_SITE_OTHER): Payer: 59 | Admitting: Adult Health

## 2018-10-28 VITALS — BP 112/67 | HR 78 | Ht 69.0 in | Wt 191.0 lb

## 2018-10-28 DIAGNOSIS — R350 Frequency of micturition: Secondary | ICD-10-CM

## 2018-10-28 MED ORDER — MIRABEGRON ER 25 MG PO TB24: 25.0000 mg | ORAL_TABLET | Freq: Every day | ORAL | 6 refills | Status: DC

## 2018-10-28 NOTE — Progress Notes (Signed)
Patient ID: Melanie Price, female   DOB: 10/24/1990, 28 y.o.   MRN: [id number] History of Present Illness: Melanie Price is a 28 year old white female, back in follow up on taking myrbetriq for urinary frequency and it is much better.    Current Medications, Allergies, Past Medical History, Past Surgical History, Family History and Social History were reviewed in [name] record.     Review of Systems: Urinary frequency has lessened, goes about every 2 hours instead of every 30 minutes Is about every 3 hours at night now which is much better     Physical Exam:BP 112/67 (BP Location: Left Arm, Patient Position: Sitting, Cuff Size: Normal)   Pulse 78   Ht 5\' 9"  (1.753 m)   Wt 191 lb (86.6 kg)   BMI 28.21 kg/m  General:  Well developed, well nourished, no acute distress Skin:  Warm and dry Lungs; Clear to auscultation bilaterally Cardiovascular: Regular rate and rhythm Psych:  No mood changes, alert and cooperative,seems happy Can increase dose if needed, but will continue 25 mg for now  Impression:  1. Urinary frequency      Plan:  Meds ordered this encounter  Medications  . mirabegron ER (MYRBETRIQ) 25 MG TB24 tablet    Sig: Take 1 tablet (25 mg total) by mouth daily.    Dispense:  30 tablet    Refill:  6    Order Specific Question:   Supervising Provider    Answer:   Duane Lope H [[id number]]  F/U prn

## 2018-12-09 ENCOUNTER — Emergency Department (HOSPITAL_COMMUNITY)
Admission: EM | Admit: 2018-12-09 | Discharge: 2018-12-09 | Disposition: A | Discharge status: Home / Self Care | Payer: 59 | Source: Home / Self Care | Attending: Emergency Medicine | Admitting: Emergency Medicine

## 2018-12-09 ENCOUNTER — Encounter (HOSPITAL_COMMUNITY): Payer: Self-pay

## 2018-12-09 ENCOUNTER — Encounter (HOSPITAL_COMMUNITY): Payer: Self-pay | Admitting: Emergency Medicine

## 2018-12-09 ENCOUNTER — Other Ambulatory Visit: Payer: Self-pay

## 2018-12-09 ENCOUNTER — Emergency Department (HOSPITAL_COMMUNITY)
Admission: EM | Admit: 2018-12-09 | Discharge: 2018-12-09 | Disposition: A | Discharge status: Home / Self Care | Payer: 59 | Attending: Emergency Medicine | Admitting: Emergency Medicine

## 2018-12-09 DIAGNOSIS — L03115 Cellulitis of right lower limb: Secondary | ICD-10-CM | POA: Insufficient documentation

## 2018-12-09 DIAGNOSIS — Z79899 Other long term (current) drug therapy: Secondary | ICD-10-CM | POA: Diagnosis not present

## 2018-12-09 DIAGNOSIS — L02415 Cutaneous abscess of right lower limb: Secondary | ICD-10-CM | POA: Insufficient documentation

## 2018-12-09 DIAGNOSIS — L0291 Cutaneous abscess, unspecified: Secondary | ICD-10-CM

## 2018-12-09 LAB — CBC WITH DIFFERENTIAL/PLATELET
Abs Immature Granulocytes: 0.02 10*3/uL (ref 0.00–0.07)
Basophils Absolute: 0 10*3/uL (ref 0.0–0.1)
Basophils Relative: 0 %
Eosinophils Absolute: 0.1 10*3/uL (ref 0.0–0.5)
Eosinophils Relative: 1 %
HCT: 36.5 % (ref 36.0–46.0)
Hemoglobin: 11 g/dL — ABNORMAL LOW (ref 12.0–15.0)
Immature Granulocytes: 0 %
Lymphocytes Relative: 15 %
Lymphs Abs: 1.4 10*3/uL (ref 0.7–4.0)
MCH: 23 pg — ABNORMAL LOW (ref 26.0–34.0)
MCHC: 30.1 g/dL (ref 30.0–36.0)
MCV: 76.4 fL — ABNORMAL LOW (ref 80.0–100.0)
Monocytes Absolute: 0.8 10*3/uL (ref 0.1–1.0)
Monocytes Relative: 9 %
Neutro Abs: 6.7 10*3/uL (ref 1.7–7.7)
Neutrophils Relative %: 75 %
Platelets: 402 10*3/uL — ABNORMAL HIGH (ref 150–400)
RBC: 4.78 MIL/uL (ref 3.87–5.11)
RDW: 16.4 % — ABNORMAL HIGH (ref 11.5–15.5)
WBC: 9.1 10*3/uL (ref 4.0–10.5)
nRBC: 0 % (ref 0.0–0.2)

## 2018-12-09 LAB — POC URINE PREG, ED: Preg Test, Ur: NEGATIVE

## 2018-12-09 LAB — BASIC METABOLIC PANEL
Anion gap: 10 (ref 5–15)
BUN: 13 mg/dL (ref 6–20)
CO2: 23 mmol/L (ref 22–32)
Calcium: 9 mg/dL (ref 8.9–10.3)
Chloride: 105 mmol/L (ref 98–111)
Creatinine, Ser: 0.93 mg/dL (ref 0.44–1.00)
GFR calc Af Amer: >60 mL/min (ref >60)
GFR calc non Af Amer: >60 mL/min (ref >60)
Glucose, Bld: 91 mg/dL (ref 70–99)
Potassium: 3.5 mmol/L (ref 3.5–5.1)
Sodium: 138 mmol/L (ref 135–145)

## 2018-12-09 MED ORDER — SULFAMETHOXAZOLE-TRIMETHOPRIM 800-160 MG PO TABS
1.0000 | ORAL_TABLET | Freq: Once | ORAL | Status: AC
  Administered 2018-12-09: 01:00:00 1 via ORAL
  Filled 2018-12-09: qty 1

## 2018-12-09 MED ORDER — SULFAMETHOXAZOLE-TRIMETHOPRIM 800-160 MG PO TABS: 1.0000 | ORAL_TABLET | Freq: Two times a day (BID) | ORAL | 0 refills | Status: AC

## 2018-12-09 MED ORDER — DIPHENHYDRAMINE HCL 50 MG/ML IJ SOLN
12.5000 mg | Freq: Once | INTRAMUSCULAR | Status: AC
  Administered 2018-12-09: 12.5 mg via INTRAVENOUS

## 2018-12-09 MED ORDER — IBUPROFEN 600 MG PO TABS: 600.0000 mg | ORAL_TABLET | Freq: Four times a day (QID) | ORAL | 0 refills | Status: DC | PRN

## 2018-12-09 MED ORDER — VANCOMYCIN HCL IN DEXTROSE 1-5 GM/200ML-% IV SOLN
1000.0000 mg | Freq: Once | INTRAVENOUS | Status: AC
  Administered 2018-12-09: 20:00:00 1000 mg via INTRAVENOUS
  Filled 2018-12-09: qty 200

## 2018-12-09 MED ORDER — POVIDONE-IODINE 10 % EX SOLN
CUTANEOUS | Status: AC
  Filled 2018-12-09: qty 15

## 2018-12-09 MED ORDER — LIDOCAINE-EPINEPHRINE (PF) 2 %-1:200000 IJ SOLN
INTRAMUSCULAR | Status: AC
  Filled 2018-12-09: qty 10

## 2018-12-09 MED ORDER — KETOROLAC TROMETHAMINE 30 MG/ML IJ SOLN
30.0000 mg | Freq: Once | INTRAMUSCULAR | Status: AC
  Administered 2018-12-09: 30 mg via INTRAVENOUS
  Filled 2018-12-09: qty 1

## 2018-12-09 MED ORDER — DIPHENHYDRAMINE HCL 50 MG/ML IJ SOLN
INTRAMUSCULAR | Status: AC
  Administered 2018-12-09: 12.5 mg via INTRAVENOUS
  Filled 2018-12-09: qty 1

## 2018-12-09 MED ORDER — TRAMADOL HCL 50 MG PO TABS
50.0000 mg | ORAL_TABLET | Freq: Once | ORAL | Status: AC
  Administered 2018-12-09: 50 mg via ORAL
  Filled 2018-12-09: qty 1

## 2018-12-09 MED ORDER — TRAMADOL HCL 50 MG PO TABS: 50.0000 mg | ORAL_TABLET | Freq: Four times a day (QID) | ORAL | 0 refills | Status: DC | PRN

## 2018-12-09 NOTE — Discharge Instructions (Signed)
Continue taking the bactrim you were prescribed last night.  Elevation and a heating pad applied to the site (15 minutes) 2-3 times daily may be helpful.  Get rechecked if your symptoms are not starting to improve over the next 1-2 days as discussed.  You may take the tramadol prescribed for pain relief.  This will make you drowsy - do not drive within 4 hours of taking this medication.

## 2018-12-09 NOTE — ED Notes (Signed)
Called to pts room and pt stating that she is beginning to itch in her scalp and behind her left ear is swelling. Vancomycin stopped. Dr. Particia Nearing and Raynelle Fanning, [place] notified. Pt in NAD at this time. Lung sounds clear, no facial or lip swelling present, no stridor noted.

## 2018-12-09 NOTE — ED Provider Notes (Signed)
[facility] EMERGENCY DEPARTMENT Provider Note   CSN: [id number] Arrival date & time: 12/09/18  0032    History   Chief Complaint Chief Complaint  Patient presents with  . Abscess    HPI Melanie Price is a 28 y.o. female.     Patient complains of swelling tenderness to right thigh  The history is provided by the patient.  Abscess  Abscess location: Right thigh. Size:  1 cm Abscess quality: not draining   Red streaking: yes   Progression:  Worsening Chronicity:  New Context: diabetes   Relieved by:  Nothing Worsened by:  Nothing Associated symptoms: no fatigue and no headaches     Past Medical History:  Diagnosis Date  . Anemia   . Bleeding in early pregnancy 12/19/2015  . Contraceptive management 09/26/2015  . Depression    post-partum depression  . Encounter for Nexplanon removal 09/26/2015  . Fatty liver 05/15/2018   Refer to GI   . Headache    migraine  . Miscarriage 12/21/2015  . Preeclampsia   . Preeclampsia     Patient Active Problem List   Diagnosis Date Noted  . Fatty liver 05/15/2018  . Kidney stones 05/09/2018  . Elevated liver enzymes 05/09/2018  . RUQ pain 05/09/2018  . Nexplanon insertion 12/10/2017  . Preeclampsia in postpartum period 10/23/2017  . Severe preeclampsia 10/18/2017  . Preeclampsia, severe, third trimester 10/18/2017    Past Surgical History:  Procedure Laterality Date  . NO PAST SURGERIES       OB History    Gravida  3   Para  2   Term  1   Preterm  1   AB  1   Living  2     SAB  1   TAB      Ectopic      Multiple  0   Live Births  2            Home Medications    Prior to Admission medications   Medication Sig Start Date End Date Taking? Authorizing Provider  escitalopram (LEXAPRO) 10 MG tablet Take 1 tablet (10 mg total) by mouth daily. 11/26/17   Cresenzo-Dishmon, Scarlette Calico, CNM  etonogestrel (NEXPLANON) 68 MG IMPL implant 1 each by Subdermal route once.    [provider]   ferrous sulfate 325 (65 FE) MG tablet Take 1 tablet (325 mg total) by mouth 2 (two) times daily with a meal. Patient taking differently: Take 325 mg by mouth daily with breakfast.  10/21/17   Constant, Peggy, MD  ibuprofen (ADVIL,MOTRIN) 600 MG tablet Take 1 tablet (600 mg total) by mouth 4 (four) times daily. Patient taking differently: Take 600 mg by mouth daily as needed (for migraine pain).  01/09/18   Ivery Quale, PA-C  megestrol (MEGACE) 40 MG tablet TAKE 3 TABLETS BY MOUTH DAILY X 5 DAYS, THEN 2 TABS X 5 DAYS, THEN 1 TAB DAILY UNTIL BLEEDING STOPS Patient not taking: Reported on 10/28/2018 10/10/18   Cheral Marker, CNM  mirabegron ER (MYRBETRIQ) 25 MG TB24 tablet Take 1 tablet (25 mg total) by mouth daily. 10/28/18   Adline Potter, NP  sulfamethoxazole-trimethoprim (BACTRIM DS) 800-160 MG tablet Take 1 tablet by mouth 2 (two) times daily for 7 days. 12/09/18 12/16/18  Bethann Berkshire, MD    Family History Family History  Problem Relation Age of Onset  . Cancer Maternal Grandfather        skin  . Pyloric stenosis Son   .  Irritable bowel syndrome Paternal Aunt   . Crohn's disease Paternal Aunt   . Heart disease Maternal Aunt   . Heart disease Maternal Uncle   . Hepatitis Maternal Uncle        viral    Social History Social History   Tobacco Use  . Smoking status: Never Smoker  . Smokeless tobacco: Never Used  Substance Use Topics  . Alcohol use: No  . Drug use: No     Allergies   Patient has no known allergies.   Review of Systems Review of Systems  Constitutional: Negative for appetite change and fatigue.  HENT: Negative for congestion, ear discharge and sinus pressure.   Eyes: Negative for discharge.  Respiratory: Negative for cough.   Cardiovascular: Negative for chest pain.  Gastrointestinal: Negative for abdominal pain and diarrhea.  Genitourinary: Negative for frequency and hematuria.  Musculoskeletal: Negative for back pain.  Skin: Positive for rash.   Neurological: Negative for seizures and headaches.  Psychiatric/Behavioral: Negative for hallucinations.     Physical Exam Updated Vital Signs BP 114/60 (BP Location: Left Arm)   Pulse 87   Temp 98.1 F (36.7 C) (Oral)   Resp 15   SpO2 100%   Physical Exam Vitals signs reviewed.  Constitutional:      Appearance: She is well-developed.  HENT:     Head: Normocephalic.     Nose: Nose normal.  Eyes:     General: No scleral icterus.    Conjunctiva/sclera: Conjunctivae normal.  Neck:     Musculoskeletal: Neck supple.     Thyroid: No thyromegaly.  Cardiovascular:     Rate and Rhythm: Normal rate and regular rhythm.     Heart sounds: No murmur. No friction rub. No gallop.   Pulmonary:     Breath sounds: No stridor. No wheezing or rales.  Chest:     Chest wall: No tenderness.  Abdominal:     General: There is no distension.     Tenderness: There is no abdominal tenderness. There is no rebound.  Musculoskeletal: Normal range of motion.  Lymphadenopathy:     Cervical: No cervical adenopathy.  Skin:    Findings: No erythema or rash.  Neurological:     Mental Status: She is oriented to person, place, and time.     Motor: No abnormal muscle tone.     Coordination: Coordination normal.  Psychiatric:        Behavior: Behavior normal.      ED Treatments / Results  Labs (all labs ordered are listed, but only abnormal results are displayed) Labs Reviewed - No data to display  EKG None  Radiology No results found.  Procedures .Marland Kitchen.Incision and Drainage Date/Time: 12/09/2018 1:04 AM Performed by: Bethann BerkshireZammit, Havish Petties, MD Authorized by: Bethann BerkshireZammit, Queenie Aufiero, MD   Consent:    Consent obtained:  Verbal   Consent given by:  Patient   Risks discussed:  Bleeding   Alternatives discussed:  No treatment Location:    Type:  Abscess   Location: leg. Pre-procedure details:    Skin preparation:  Antiseptic wash and Betadine Sedation:    Sedation type:  Anxiolysis Anesthesia (see MAR  for exact dosages):    Anesthesia method:  Local infiltration   Local anesthetic:  Lidocaine 1% WITH epi Comments:     Patient had a abscess to her right thigh.  Area was cleaned thoroughly with 1% lidocaine with epi.  A #11 blade was used to open the abscess.  Minimal pus was removed.  Patient  tolerated the procedure well.  No packing was done   (including critical care time)  Medications Ordered in ED Medications  sulfamethoxazole-trimethoprim (BACTRIM DS) 800-160 MG per tablet 1 tablet (has no administration in time range)  lidocaine-EPINEPHrine (XYLOCAINE W/EPI) 2 %-1:200000 (PF) injection (has no administration in time range)  povidone-iodine (BETADINE) 10 % external solution (has no administration in time range)     Initial Impression / Assessment and Plan / ED Course  I have reviewed the triage vital signs and the nursing notes.  Pertinent labs & imaging results that were available during my care of the patient were reviewed by me and considered in my medical decision making (see chart for details).    Patient with small abscess and cellulitis to right thigh.  She is placed on Bactrim and will follow-up in 2 days      Final Clinical Impressions(s) / ED Diagnoses   Final diagnoses:  Abscess    ED Discharge Orders         Ordered    sulfamethoxazole-trimethoprim (BACTRIM DS) 800-160 MG tablet  2 times daily     12/09/18 0055           Bethann Berkshire, MD 12/09/18 0105

## 2018-12-09 NOTE — ED Triage Notes (Signed)
Pt presents to ED with abscess to right thigh. Pt had area lanced last night and noticed more redness this morning. Pt unsure of fever.

## 2018-12-09 NOTE — ED Notes (Signed)
Pt ambulatory to waiting room. Pt verbalized understanding of discharge instructions.   

## 2018-12-09 NOTE — Discharge Instructions (Addendum)
Clean area twice a day with soap and water and follow-up with your doctor in 2 days for recheck return sooner if problems

## 2018-12-09 NOTE — ED Provider Notes (Addendum)
[facility]ENCY [facility]PARTMENT Provider Note   CSN: [id number] Arrival date & time: 12/09/18  1716    History   Chief Complaint Chief Complaint  Patient presents with  . Cellulitis    HPI Melanie Price is a 28 y.o. female with a history of Elita Booneash, presenting for re evaluation of an abscess she was seen here last night, underwent I&D and has had 3 doses of her antibiotic bactrim reports significantly worsened pain and expanding redness.  She also endorses generalized myalgia/body aches which has been worsening today and reports subjective fever.  She took 2 ibuprofen this am without relief of symptoms.     HPI  Past Medical History:  Diagnosis Date  . Anemia   . Bleeding in early pregnancy 12/19/2015  . Contraceptive management 09/26/2015  . Depression    post-partum depression  . Encounter for Nexplanon removal 09/26/2015  . Fatty liver 05/15/2018   Refer to GI   . Headache    migraine  . Miscarriage 12/21/2015  . Preeclampsia   . Preeclampsia     Patient Active Problem List   Diagnosis Date Noted  . Fatty liver 05/15/2018  . Kidney stones 05/09/2018  . Elevated liver enzymes 05/09/2018  . RUQ pain 05/09/2018  . Nexplanon insertion 12/10/2017  . Preeclampsia in postpartum period 10/23/2017  . Severe preeclampsia 10/18/2017  . Preeclampsia, severe, third trimester 10/18/2017    Past Surgical History:  Procedure Laterality Date  . NO PAST SURGERIES       OB History    Gravida  3   Para  2   Term  1   Preterm  1   AB  1   Living  2     SAB  1   TAB      Ectopic      Multiple  0   Live Births  2            Home Medications    Prior to Admission medications   Medication Sig Start Date End Date Taking? Authorizing Provider  escitalopram (LEXAPRO) 10 MG tablet Take 1 tablet (10 mg total) by mouth daily. 11/26/17  Yes Cresenzo-Dishmon, Scarlette CalicoFrances, CNM  etonogestrel (NEXPLANON) 68 MG IMPL implant 1 each by Subdermal route once.   Yes [provider]  ferrous sulfate 325 (65 FE) MG tablet Take 1 tablet (325 mg total) by mouth 2 (two) times daily with a meal. Patient taking differently: Take 325 mg by mouth daily with breakfast.  10/21/17  Yes Constant, Peggy, MD  mirabegron ER (MYRBETRIQ) 25 MG TB24 tablet Take 1 tablet (25 mg total) by mouth daily. 10/28/18  Yes Cyril MourningGriffin, Jennifer A, NP  sulfamethoxazole-trimethoprim (BACTRIM DS) 800-160 MG tablet Take 1 tablet by mouth 2 (two) times daily for 7 days. 12/09/18 12/16/18 Yes Bethann BerkshireZammit, Joseph, MD  ibuprofen (ADVIL) 600 MG tablet Take 1 tablet (600 mg total) by mouth every 6 (six) hours as needed. 12/09/18   Burgess AmorIdol, Nixie Laube, PA-C  megestrol (MEGACE) 40 MG tablet TAKE 3 TABLETS BY MOUTH DAILY X 5 DAYS, THEN 2 TABS X 5 DAYS, THEN 1 TAB DAILY UNTIL BLEEDING STOPS Patient not taking: Reported on 10/28/2018 10/10/18   Cheral MarkerBooker, Kimberly R, CNM  traMADol (ULTRAM) 50 MG tablet Take 1 tablet (50 mg total) by mouth every 6 (six) hours as needed. 12/09/18   Burgess AmorIdol, Karlye Ihrig, PA-C    Family History Family History  Problem Relation Age of Onset  . Cancer Maternal Grandfather  skin  . Pyloric stenosis Son   . Irritable bowel syndrome Paternal Aunt   . Crohn's disease Paternal Aunt   . Heart disease Maternal Aunt   . Heart disease Maternal Uncle   . Hepatitis Maternal Uncle        viral    Social History Social History   Tobacco Use  . Smoking status: Never Smoker  . Smokeless tobacco: Never Used  Substance Use Topics  . Alcohol use: No  . Drug use: No     Allergies   Patient has no known allergies.   Review of Systems Review of Systems   Physical Exam Updated Vital Signs BP (!) 113/53   Pulse 82   Temp 99.3 F (37.4 C) (Oral)   Resp 18   Ht  (1.727 m)   Wt 81.6 kg   SpO2 100%   BMI 27.37 kg/m   Physical Exam Constitutional:      General: She is not in acute distress.    Appearance: She is well-developed.  HENT:     Head: Normocephalic.  Neck:     Musculoskeletal:  Neck supple.  Cardiovascular:     Rate and Rhythm: Normal rate.     Comments: Borderline tachy. Pulmonary:     Effort: Pulmonary effort is normal.     Breath sounds: No wheezing.  Musculoskeletal: Normal range of motion.  Skin:    Findings: Erythema present.     Comments: Cellulitis right mid anterior thigh measuring 23 x 12 cm with small central I&D site.  Prior marking measures 4x4 cm immediately surrounding the incised infection.  No drainage, mild central induration without fluctuance.      ED Treatments / Results  Labs (all labs ordered are listed, but only abnormal results are displayed) Labs Reviewed  CBC WITH DIFFERENTIAL/PLATELET - Abnormal; Notable for the following components:      Result Value   Hemoglobin 11.0 (*)    MCV 76.4 (*)    MCH 23.0 (*)    RDW 16.4 (*)    Platelets 402 (*)    All other components within normal limits  BASIC METABOLIC PANEL  POC URINE PREG, ED     EKG None  Radiology No results found.  Procedures Procedures (including critical care time)  Medications Ordered in ED Medications  vancomycin (VANCOCIN) IVPB 1000 mg/200 mL premix (1,000 mg Intravenous New Bag/Given 12/09/18 1932)  traMADol (ULTRAM) tablet 50 mg (has no administration in time range)  ketorolac (TORADOL) 30 MG/ML injection 30 mg (30 mg Intravenous Given 12/09/18 1827)     Initial Impression / Assessment and Plan / ED Course  I have reviewed the triage vital signs and the nursing notes.  Pertinent labs & imaging results that were available during my care of the patient were reviewed by me and considered in my medical decision making (see chart for details).        No further expansion of cellulitis during ed visit. She was given a dose of IV vancomycin while here.  She has only had 3 doses of bactrim prior to this visit, will have her continue this medicine.  Discussed elevation, heat tx.  Discussed return precautions if area is not starting to recede over the next  24-48 hours, sooner for any substantial increase.  New area outlined with marker.    8:31 PM Pt received 500 cc of vancomycin, then started developing redness of her posterior neck and left ear along with scalp itching.  Abx dc'd, benadryl  given.  Pt's itching has resolved, still with mild erythema of neck and left ear without progression. No sob, no wheeze, stridor, no urticaria or facial/mouth swelling. Pt observed in dept with no progression of sx.      Final Clinical Impressions(s) / ED Diagnoses   Final diagnoses:  Cellulitis of right lower extremity    ED Discharge Orders         Ordered    traMADol (ULTRAM) 50 MG tablet  Every 6 hours PRN     12/09/18 1943    ibuprofen (ADVIL) 600 MG tablet  Every 6 hours PRN     12/09/18 1943           Victoriano Lain 12/09/18 1946    Jacalyn Lefevre, MD 12/09/18 Rushie Goltz    Burgess Amor, PA-C 12/09/18 2049    Jacalyn Lefevre, MD 12/09/18 2102

## 2018-12-09 NOTE — ED Triage Notes (Signed)
Pt C/o abscess to the right thigh that began this morning. Pt denies fevers at home.

## 2019-01-07 ENCOUNTER — Ambulatory Visit: Payer: 59 | Admitting: Nurse Practitioner

## 2019-01-07 ENCOUNTER — Encounter: Payer: Self-pay | Admitting: Nurse Practitioner

## 2019-01-07 ENCOUNTER — Other Ambulatory Visit: Payer: Self-pay

## 2019-01-07 ENCOUNTER — Telehealth: Payer: Self-pay | Admitting: *Deleted

## 2019-01-07 NOTE — Progress Notes (Signed)
Patient was a no-show for virtual visit.    

## 2019-01-07 NOTE — Telephone Encounter (Signed)
EG contacted patient for virtual visit today at 1:30pm. Patient was driving in the car and needs to r/s this appt.

## 2019-01-09 ENCOUNTER — Ambulatory Visit: Payer: 59 | Admitting: Gastroenterology

## 2019-01-24 ENCOUNTER — Other Ambulatory Visit: Payer: Self-pay | Admitting: Women's Health

## 2019-02-11 ENCOUNTER — Other Ambulatory Visit: Payer: Self-pay

## 2019-02-11 ENCOUNTER — Emergency Department (HOSPITAL_COMMUNITY): Payer: 59

## 2019-02-11 ENCOUNTER — Encounter (HOSPITAL_COMMUNITY): Payer: Self-pay

## 2019-02-11 ENCOUNTER — Emergency Department (HOSPITAL_COMMUNITY)
Admission: EM | Admit: 2019-02-11 | Discharge: 2019-02-12 | Disposition: A | Discharge status: Home / Self Care | Payer: 59 | Attending: Emergency Medicine | Admitting: Emergency Medicine

## 2019-02-11 DIAGNOSIS — Z79899 Other long term (current) drug therapy: Secondary | ICD-10-CM | POA: Diagnosis not present

## 2019-02-11 DIAGNOSIS — R1031 Right lower quadrant pain: Secondary | ICD-10-CM | POA: Diagnosis present

## 2019-02-11 DIAGNOSIS — N132 Hydronephrosis with renal and ureteral calculous obstruction: Secondary | ICD-10-CM | POA: Diagnosis not present

## 2019-02-11 DIAGNOSIS — E876 Hypokalemia: Secondary | ICD-10-CM | POA: Diagnosis not present

## 2019-02-11 DIAGNOSIS — N201 Calculus of ureter: Secondary | ICD-10-CM

## 2019-02-11 LAB — URINALYSIS, ROUTINE W REFLEX MICROSCOPIC
Bacteria, UA: NONE SEEN
Bilirubin Urine: NEGATIVE
Glucose, UA: NEGATIVE mg/dL
Ketones, ur: NEGATIVE mg/dL
Leukocytes,Ua: NEGATIVE
Nitrite: NEGATIVE
Protein, ur: NEGATIVE mg/dL
Specific Gravity, Urine: 1.021 (ref 1.005–1.030)
pH: 5 (ref 5.0–8.0)

## 2019-02-11 LAB — COMPREHENSIVE METABOLIC PANEL
ALT: 20 U/L (ref 0–44)
AST: 43 U/L — ABNORMAL HIGH (ref 15–41)
Albumin: 4.3 g/dL (ref 3.5–5.0)
Alkaline Phosphatase: 121 U/L (ref 38–126)
Anion gap: 11 (ref 5–15)
BUN: 15 mg/dL (ref 6–20)
CO2: 20 mmol/L — ABNORMAL LOW (ref 22–32)
Calcium: 8.9 mg/dL (ref 8.9–10.3)
Chloride: 107 mmol/L (ref 98–111)
Creatinine, Ser: 0.91 mg/dL (ref 0.44–1.00)
GFR calc Af Amer: >60 mL/min (ref >60)
GFR calc non Af Amer: >60 mL/min (ref >60)
Glucose, Bld: 149 mg/dL — ABNORMAL HIGH (ref 70–99)
Potassium: 2.9 mmol/L — ABNORMAL LOW (ref 3.5–5.1)
Sodium: 138 mmol/L (ref 135–145)
Total Bilirubin: 0.3 mg/dL (ref 0.3–1.2)
Total Protein: 8.1 g/dL (ref 6.5–8.1)

## 2019-02-11 LAB — CBC
HCT: 36.2 % (ref 36.0–46.0)
Hemoglobin: 10.9 g/dL — ABNORMAL LOW (ref 12.0–15.0)
MCH: 22.9 pg — ABNORMAL LOW (ref 26.0–34.0)
MCHC: 30.1 g/dL (ref 30.0–36.0)
MCV: 75.9 fL — ABNORMAL LOW (ref 80.0–100.0)
Platelets: 479 10*3/uL — ABNORMAL HIGH (ref 150–400)
RBC: 4.77 MIL/uL (ref 3.87–5.11)
RDW: 16.5 % — ABNORMAL HIGH (ref 11.5–15.5)
WBC: 13.1 10*3/uL — ABNORMAL HIGH (ref 4.0–10.5)
nRBC: 0 % (ref 0.0–0.2)

## 2019-02-11 LAB — HCG, SERUM, QUALITATIVE: Preg, Serum: NEGATIVE

## 2019-02-11 MED ORDER — KETOROLAC TROMETHAMINE 30 MG/ML IJ SOLN
15.0000 mg | Freq: Once | INTRAMUSCULAR | Status: AC
  Administered 2019-02-12: 15 mg via INTRAVENOUS
  Filled 2019-02-11: qty 1

## 2019-02-11 MED ORDER — SODIUM CHLORIDE 0.9 % IV BOLUS
1000.0000 mL | Freq: Once | INTRAVENOUS | Status: AC
  Administered 2019-02-11: 1000 mL via INTRAVENOUS

## 2019-02-11 MED ORDER — OXYCODONE-ACETAMINOPHEN 5-325 MG PO TABS: 1.0000 | ORAL_TABLET | ORAL | 0 refills | Status: DC | PRN

## 2019-02-11 MED ORDER — POTASSIUM CHLORIDE CRYS ER 20 MEQ PO TBCR
40.0000 meq | EXTENDED_RELEASE_TABLET | Freq: Once | ORAL | Status: AC
  Administered 2019-02-11: 40 meq via ORAL
  Filled 2019-02-11: qty 2

## 2019-02-11 MED ORDER — HYDROMORPHONE HCL 1 MG/ML IJ SOLN
1.0000 mg | Freq: Once | INTRAMUSCULAR | Status: AC
  Administered 2019-02-11: 1 mg via INTRAVENOUS
  Filled 2019-02-11: qty 1

## 2019-02-11 MED ORDER — MORPHINE SULFATE (PF) 4 MG/ML IV SOLN
4.0000 mg | Freq: Once | INTRAVENOUS | Status: AC
  Administered 2019-02-11: 4 mg via INTRAVENOUS
  Filled 2019-02-11: qty 1

## 2019-02-11 MED ORDER — OXYCODONE-ACETAMINOPHEN 5-325 MG PO TABS
1.0000 | ORAL_TABLET | Freq: Once | ORAL | Status: AC
  Administered 2019-02-12: 1 via ORAL
  Filled 2019-02-11: qty 1

## 2019-02-11 MED ORDER — ONDANSETRON HCL 4 MG/2ML IJ SOLN
4.0000 mg | Freq: Once | INTRAMUSCULAR | Status: AC
  Administered 2019-02-11: 4 mg via INTRAVENOUS
  Filled 2019-02-11: qty 2

## 2019-02-11 MED ORDER — POTASSIUM CHLORIDE CRYS ER 20 MEQ PO TBCR: 20.0000 meq | EXTENDED_RELEASE_TABLET | Freq: Two times a day (BID) | ORAL | 0 refills | Status: DC

## 2019-02-11 NOTE — Discharge Instructions (Addendum)
As discussed, you have a 3 mm kidney stone which is passing through your right ureter.  It is small enough that it should pass on its own, however will be painful intermittently until it does.  Call Dr. Diona Fanti listed above for further management of this kidney stone.  In the interim get rechecked immediately for any fevers, vomiting or pain that is not relieved by the medication prescribed.  Do not drive within 4 hours of taking oxycodone as this medication will make you drowsy.

## 2019-02-11 NOTE — ED Notes (Signed)
Pt sleeping. 

## 2019-02-11 NOTE — ED Provider Notes (Signed)
[facility] EMERGENCY DEPARTMENT Provider Note   CSN: [id number] Arrival date & time: 02/11/19  1952     History   Chief Complaint Chief Complaint  Patient presents with  . Flank Pain    HPI Melanie Price is a 28 y.o. female with a hx of anemia, depression, prior pre-eclampsia, & elevated LFTs who presents to the ED with complaints of R flank pain that began fairly suddenly about 1 hour PTA. Pain located in R flank, radiates to RLQ, is severe, & is w/o alleviating/aggravating factors. Reports associated nausea without vomiting. Denies fever, chills, urinary sxs, vaginal bleeding/discharge, chest pain, or dyspnea. Currently utilizes birth control, unknown last period, sexually active w/ 1 partner w/o concern for infection. She states she has not had symptoms like this before.     HPI  Past Medical History:  Diagnosis Date  . Anemia   . Bleeding in early pregnancy 12/19/2015  . Contraceptive management 09/26/2015  . Depression    post-partum depression  . Encounter for Nexplanon removal 09/26/2015  . Fatty liver 05/15/2018   Refer to GI   . Headache    migraine  . Miscarriage 12/21/2015  . Preeclampsia   . Preeclampsia     Patient Active Problem List   Diagnosis Date Noted  . Fatty liver 05/15/2018  . Kidney stones 05/09/2018  . Elevated liver enzymes 05/09/2018  . RUQ pain 05/09/2018  . Nexplanon insertion 12/10/2017  . Preeclampsia in postpartum period 10/23/2017  . Severe preeclampsia 10/18/2017  . Preeclampsia, severe, third trimester 10/18/2017    Past Surgical History:  Procedure Laterality Date  . NO PAST SURGERIES       OB History    Gravida  3   Para  2   Term  1   Preterm  1   AB  1   Living  2     SAB  1   TAB      Ectopic      Multiple  0   Live Births  2            Home Medications    Prior to Admission medications   Medication Sig Start Date End Date Taking? Authorizing Provider  etonogestrel (NEXPLANON) 68 MG IMPL  implant 1 each by Subdermal route once.    [provider]  megestrol (MEGACE) 40 MG tablet TAKE 3 TABLETS BY MOUTH DAILY X 5 DAYS, THEN 2 TABS X 5 DAYS, THEN 1 TAB DAILY UNTIL BLEEDING STOPS 01/26/19   Roma Schanz, CNM    Family History Family History  Problem Relation Age of Onset  . Cancer Maternal Grandfather        skin  . Pyloric stenosis Son   . Irritable bowel syndrome Paternal Aunt   . Crohn's disease Paternal Aunt   . Heart disease Maternal Aunt   . Heart disease Maternal Uncle   . Hepatitis Maternal Uncle        viral    Social History Social History   Tobacco Use  . Smoking status: Never Smoker  . Smokeless tobacco: Never Used  Substance Use Topics  . Alcohol use: No  . Drug use: No     Allergies   Vancomycin   Review of Systems Review of Systems  Constitutional: Negative for chills and fever.  Respiratory: Negative for shortness of breath.   Cardiovascular: Negative for chest pain.  Gastrointestinal: Positive for abdominal pain and nausea. Negative for blood in stool, constipation, diarrhea and vomiting.  Genitourinary: Positive for flank pain. Negative for decreased urine volume, dysuria, hematuria, urgency, vaginal bleeding and vaginal discharge.  All other systems reviewed and are negative.  Physical Exam Updated Vital Signs BP (!) 147/119 (BP Location: Right Arm)   Pulse 63   Temp 98.1 F (36.7 C) (Oral)   Resp 17   Ht 5\' 9"  (1.753 m)   Wt 77.1 kg   SpO2 100%   BMI 25.10 kg/m   Physical Exam Vitals signs and nursing note reviewed.  Constitutional:      General: She is not in acute distress.    Appearance: She is well-developed. She is not toxic-appearing.  HENT:     Head: Normocephalic and atraumatic.  Eyes:     General:        Right eye: No discharge.        Left eye: No discharge.     Conjunctiva/sclera: Conjunctivae normal.  Neck:     Musculoskeletal: Neck supple.  Cardiovascular:     Rate and Rhythm: Normal  rate and regular rhythm.  Pulmonary:     Effort: Pulmonary effort is normal. No respiratory distress.     Breath sounds: Normal breath sounds. No wheezing, rhonchi or rales.  Abdominal:     General: There is no distension.     Palpations: Abdomen is soft.     Tenderness: There is abdominal tenderness (R mid abdomen). There is right CVA tenderness. There is no left CVA tenderness, guarding or rebound. Negative signs include Murphy's sign and McBurney's sign.  Skin:    General: Skin is warm and dry.     Findings: No rash.  Neurological:     Mental Status: She is alert.     Comments: Clear speech.   Psychiatric:        Behavior: Behavior normal.    ED Treatments / Results  Labs (all labs ordered are listed, but only abnormal results are displayed) Labs Reviewed  CBC - Abnormal; Notable for the following components:      Result Value   WBC 13.1 (*)    Hemoglobin 10.9 (*)    MCV 75.9 (*)    MCH 22.9 (*)    RDW 16.5 (*)    Platelets 479 (*)    All other components within normal limits  COMPREHENSIVE METABOLIC PANEL - Abnormal; Notable for the following components:   Potassium 2.9 (*)    CO2 20 (*)    Glucose, Bld 149 (*)    AST 43 (*)    All other components within normal limits  HCG, SERUM, QUALITATIVE  URINALYSIS, ROUTINE W REFLEX MICROSCOPIC   EKG None  Radiology No results found.  Procedures Procedures (including critical care time)  Medications Ordered in ED Medications  sodium chloride 0.9 % bolus 1,000 mL (has no administration in time range)  potassium chloride SA (K-DUR) CR tablet 40 mEq (has no administration in time range)  ondansetron (ZOFRAN) injection 4 mg (4 mg Intravenous Given 02/11/19 2022)  sodium chloride 0.9 % bolus 1,000 mL (1,000 mLs Intravenous New Bag/Given 02/11/19 2022)  morphine 4 MG/ML injection 4 mg (4 mg Intravenous Given 02/11/19 2022)  HYDROmorphone (DILAUDID) injection 1 mg (1 mg Intravenous Given 02/11/19 2130)     Initial Impression /  Assessment and Plan / ED Course  I have reviewed the triage vital signs and the nursing notes.  Pertinent labs & imaging results that were available during my care of the patient were reviewed by me and considered in my medical  decision making (see chart for details).    Patient presents to the ED with complaints of flank/abdominal pain. Patient nontoxic appearing, vitals without significant abnormalities- initial BP somewhat elevated, doubt HTN emergency. On exam patient is tender to R CVA area & R mid abdomen, no peritoneal signs, negative mcburneys/murphys. DDX: nephrolithiasis, pyelonephritis/UTI, cholecystitis, appendicitis, musculoskeletal pain, ectopic pregnancy, feel nephrolithiasis is most likely at this time will evaluate with labs and CT renal study, analgesics, anti-emetics, and fluids ordered.   CBC: Mild leukocytosis @ 13.1. Anemia similar to prior on chart review CMP: Hypokalemia @ 2.9- plan for oral replacement. Hyperglycemia @ 149- PCP recheck. AST mildly up similar to prior. BUN/creatinine WNL. Pregnancy test: Negative- doubt ectopic.   22:00: Patient care signed out to Burgess AmorJulie Idol PA-C at change of shift pending UA, CT renal study, re-eval, & disposition.   Findings and plan of care discussed with supervising physician Dr. Adriana Simasook who has evaluated patient & is in agreement.   Final Clinical Impressions(s) / ED Diagnoses   Final diagnoses:  None    ED Discharge Orders    None       Desmond Lopeetrucelli, Admir Candelas R, PA-C 02/11/19 2152    Donnetta Hutchingook, Brian, MD 02/12/19 1523

## 2019-02-11 NOTE — ED Triage Notes (Signed)
Pt reports right sided flank pain that started about an hour ago. Pt reports nausea. Pt denies urinary symptoms. Pt reports having kidney stone about a year ago.

## 2019-02-12 NOTE — ED Provider Notes (Signed)
Patient signed out by Harvie HeckSamantha Petrucelli, PA-C.  Ct imaging per below, significant for a right 3 mm ureteral stone and moderate hydronephrosis.  Urine is negative for UTI.  She had no nausea or vomiting while here, she did have mild persistent right flank pain.  She was given a dose of Toradol and also a Percocet tablet prior to discharge home.  Additional pain medicine prescribed.  Referral to Dr. Retta Dionesahlstedt for definitive treatment and ongoing care of her history kidney stone disease.  She was given strict return precautions including fevers, vomiting or uncontrolled pain.  Results for orders placed or performed during the hospital encounter of 02/11/19  Urinalysis, Routine w reflex microscopic- may I&O cath if menses  Result Value Ref Range   Color, Urine AMBER (A) YELLOW   APPearance HAZY (A) CLEAR   Specific Gravity, Urine 1.021 1.005 - 1.030   pH 5.0 5.0 - 8.0   Glucose, UA NEGATIVE NEGATIVE mg/dL   Hgb urine dipstick MODERATE (A) NEGATIVE   Bilirubin Urine NEGATIVE NEGATIVE   Ketones, ur NEGATIVE NEGATIVE mg/dL   Protein, ur NEGATIVE NEGATIVE mg/dL   Nitrite NEGATIVE NEGATIVE   Leukocytes,Ua NEGATIVE NEGATIVE   RBC / HPF 21-50 0 - 5 RBC/hpf   WBC, UA 0-5 0 - 5 WBC/hpf   Bacteria, UA NONE SEEN NONE SEEN   Squamous Epithelial / LPF 11-20 0 - 5   Mucus PRESENT   CBC  Result Value Ref Range   WBC 13.1 (H) 4.0 - 10.5 K/uL   RBC 4.77 3.87 - 5.11 MIL/uL   Hemoglobin 10.9 (L) 12.0 - 15.0 g/dL   HCT 81.136.2 91.436.0 - 78.246.0 %   MCV 75.9 (L) 80.0 - 100.0 fL   MCH 22.9 (L) 26.0 - 34.0 pg   MCHC 30.1 30.0 - 36.0 g/dL   RDW 95.616.5 (H) 21.311.5 - 08.615.5 %   Platelets 479 (H) 150 - 400 K/uL   nRBC 0.0 0.0 - 0.2 %  Comprehensive metabolic panel  Result Value Ref Range   Sodium 138 135 - 145 mmol/L   Potassium 2.9 (L) 3.5 - 5.1 mmol/L   Chloride 107 98 - 111 mmol/L   CO2 20 (L) 22 - 32 mmol/L   Glucose, Bld 149 (H) 70 - 99 mg/dL   BUN 15 6 - 20 mg/dL   Creatinine, Ser 5.780.91 0.44 - 1.00 mg/dL   Calcium  8.9 8.9 - 46.910.3 mg/dL   Total Protein 8.1 6.5 - 8.1 g/dL   Albumin 4.3 3.5 - 5.0 g/dL   AST 43 (H) 15 - 41 U/L   ALT 20 0 - 44 U/L   Alkaline Phosphatase 121 38 - 126 U/L   Total Bilirubin 0.3 0.3 - 1.2 mg/dL   GFR calc non Af Amer >60 >60 mL/min   GFR calc Af Amer >60 >60 mL/min   Anion gap 11 5 - 15  hCG, serum, qualitative  Result Value Ref Range   Preg, Serum NEGATIVE NEGATIVE   Ct Renal Stone Study  Result Date: 02/11/2019 CLINICAL DATA:  Right-sided flank pain EXAM: CT ABDOMEN AND PELVIS WITHOUT CONTRAST TECHNIQUE: Multidetector CT imaging of the abdomen and pelvis was performed following the standard protocol without IV contrast. COMPARISON:  05/01/2018 FINDINGS: Lower chest: No acute abnormality. Hepatobiliary: No focal liver abnormality is seen. No gallstones, gallbladder wall thickening, or biliary dilatation. Pancreas: Unremarkable. No pancreatic ductal dilatation or surrounding inflammatory changes. Spleen: Normal in size without focal abnormality. Adrenals/Urinary Tract: Adrenal glands are within normal limits. Kidneys  are well visualized bilaterally. The left kidney shows no renal calculi or urinary tract obstructive changes. Moderate hydronephrosis and hydroureter is noted on the right which extends to the level of the right UVJ. A 3 mm stone is noted causing the obstructive change. The bladder is decompressed. A tiny upper pole stone is noted within the right kidney. Stomach/Bowel: No obstructive or inflammatory changes of the large or small bowel are seen. The appendix is within normal limits. The stomach is unremarkable. Vascular/Lymphatic: No significant vascular findings are present. No enlarged abdominal or pelvic lymph nodes. Reproductive: Uterus and bilateral adnexa are unremarkable. Other: No abdominal wall hernia or abnormality. No abdominopelvic ascites. Musculoskeletal: No acute or significant osseous findings. IMPRESSION: 3 mm obstructing stone at the right UVJ. Tiny  nonobstructing right renal stone in the upper pole. Electronically Signed   By: Inez Catalina M.D.   On: 02/11/2019 22:00      Landis Martins 02/12/19 0014    Nat Christen, MD 02/12/19 1524

## 2019-02-12 NOTE — ED Notes (Signed)
Pt given strainer and a specimen cup to go home with

## 2019-02-13 MED FILL — Oxycodone w/ Acetaminophen Tab 5-325 MG: ORAL | Qty: 6 | Status: AC

## 2019-07-10 ENCOUNTER — Other Ambulatory Visit: Payer: Self-pay | Admitting: Advanced Practice Midwife

## 2019-07-29 ENCOUNTER — Other Ambulatory Visit: Payer: Self-pay | Admitting: Advanced Practice Midwife

## 2020-01-28 ENCOUNTER — Emergency Department (HOSPITAL_COMMUNITY): Admission: EM | Admit: 2020-01-28 | Discharge: 2020-01-28 | Discharge status: Against Medical Advice | Payer: 59

## 2020-01-28 ENCOUNTER — Other Ambulatory Visit: Payer: Self-pay

## 2020-01-28 NOTE — ED Triage Notes (Signed)
Called for triage, no answer

## 2020-03-10 ENCOUNTER — Emergency Department (HOSPITAL_COMMUNITY): Payer: 59

## 2020-03-10 ENCOUNTER — Emergency Department (HOSPITAL_COMMUNITY)
Admission: EM | Admit: 2020-03-10 | Discharge: 2020-03-10 | Disposition: A | Discharge status: Home / Self Care | Payer: 59 | Attending: Emergency Medicine | Admitting: Emergency Medicine

## 2020-03-10 ENCOUNTER — Other Ambulatory Visit: Payer: Self-pay

## 2020-03-10 ENCOUNTER — Encounter (HOSPITAL_COMMUNITY): Payer: Self-pay

## 2020-03-10 DIAGNOSIS — R079 Chest pain, unspecified: Secondary | ICD-10-CM

## 2020-03-10 DIAGNOSIS — U071 COVID-19: Secondary | ICD-10-CM | POA: Diagnosis not present

## 2020-03-10 LAB — CBC
HCT: 39.6 % (ref 36.0–46.0)
Hemoglobin: 12.4 g/dL (ref 12.0–15.0)
MCH: 26 pg (ref 26.0–34.0)
MCHC: 31.3 g/dL (ref 30.0–36.0)
MCV: 83 fL (ref 80.0–100.0)
Platelets: 275 10*3/uL (ref 150–400)
RBC: 4.77 MIL/uL (ref 3.87–5.11)
RDW: 14.6 % (ref 11.5–15.5)
WBC: 5.7 10*3/uL (ref 4.0–10.5)
nRBC: 0 % (ref 0.0–0.2)

## 2020-03-10 LAB — BASIC METABOLIC PANEL
Anion gap: 8 (ref 5–15)
BUN: 10 mg/dL (ref 6–20)
CO2: 20 mmol/L — ABNORMAL LOW (ref 22–32)
Calcium: 8.9 mg/dL (ref 8.9–10.3)
Chloride: 104 mmol/L (ref 98–111)
Creatinine, Ser: 0.73 mg/dL (ref 0.44–1.00)
GFR calc Af Amer: >60 mL/min (ref >60)
GFR calc non Af Amer: >60 mL/min (ref >60)
Glucose, Bld: 99 mg/dL (ref 70–99)
Potassium: 3.5 mmol/L (ref 3.5–5.1)
Sodium: 132 mmol/L — ABNORMAL LOW (ref 135–145)

## 2020-03-10 LAB — D-DIMER, QUANTITATIVE: D-Dimer, Quant: 4.21 ug/mL-FEU — ABNORMAL HIGH (ref 0.00–0.50)

## 2020-03-10 LAB — TROPONIN I (HIGH SENSITIVITY)
Troponin I (High Sensitivity): <2 ng/L (ref <18)
Troponin I (High Sensitivity): <2 ng/L (ref <18)

## 2020-03-10 LAB — POC URINE PREG, ED: Preg Test, Ur: NEGATIVE

## 2020-03-10 MED ORDER — NAPROXEN 500 MG PO TABS
500.0000 mg | ORAL_TABLET | Freq: Two times a day (BID) | ORAL | 0 refills | Status: DC | PRN
Start: 2020-03-10 — End: 2021-05-23

## 2020-03-10 MED ORDER — KETOROLAC TROMETHAMINE 60 MG/2ML IM SOLN
60.0000 mg | Freq: Once | INTRAMUSCULAR | Status: AC
  Administered 2020-03-10: 60 mg via INTRAMUSCULAR
  Filled 2020-03-10: qty 2

## 2020-03-10 MED ORDER — IOHEXOL 350 MG/ML SOLN
75.0000 mL | Freq: Once | INTRAVENOUS | Status: AC | PRN
  Administered 2020-03-10: 75 mL via INTRAVENOUS

## 2020-03-10 MED ORDER — ACETAMINOPHEN 325 MG PO TABS
650.0000 mg | ORAL_TABLET | Freq: Once | ORAL | Status: AC
  Administered 2020-03-10: 650 mg via ORAL
  Filled 2020-03-10: qty 2

## 2020-03-10 NOTE — Discharge Instructions (Addendum)
Please read the attachment on COVID-19.  Please check your temperature regularly and take the prescribed naproxen or Tylenol as needed for fever control.  Please also take the naproxen for your body aches or chest discomfort.  Be sure to continue drinking plenty of fluids to avoid dehydration.  I would also like for you to continue eating regular meals.  You may try over-the-counter medications for relief of any cough or sore throat symptoms.  Please continue to maintain isolation precautions.  Return to the ED or seek immediate medical attention should you experience any new or worsening symptoms.

## 2020-03-10 NOTE — ED Provider Notes (Addendum)
[facility] EMERGENCY DEPARTMENT Provider Note   CSN: [id number] Arrival date & time: 03/10/20  1209     History Chief Complaint  Patient presents with  . Chest Pain  . covid    Melanie Price is a 29 y.o. female with PMH significant for positive COVID-19 testing yesterday 03/09/2020 [facility] who presents the ED with complaints of chest and back pain.  Patient reports that her husband tested positive for COVID-19 several days ago and that she became symptomatic yesterday.  She has a cough, but predominantly her symptoms are limited to central chest and back pain.  She states that her symptoms are worse with the cough, but also worse with exertion and lying flat.  She complains of 10 out of 10 pain and inability to get comfortable.  She thinks that she may have had a DVT during one of her pregnancies, but is unsure.  She denies any recent unilateral extremity edema or swelling, hemoptysis, abdominal pain, nausea or vomiting, diaphoresis, or other symptoms.  HPI     Past Medical History:  Diagnosis Date  . Anemia   . Bleeding in early pregnancy 12/19/2015  . Contraceptive management 09/26/2015  . Depression    post-partum depression  . Encounter for Nexplanon removal 09/26/2015  . Fatty liver 05/15/2018   Refer to GI   . Headache    migraine  . Miscarriage 12/21/2015  . Preeclampsia   . Preeclampsia     Patient Active Problem List   Diagnosis Date Noted  . Fatty liver 05/15/2018  . Kidney stones 05/09/2018  . Elevated liver enzymes 05/09/2018  . RUQ pain 05/09/2018  . Nexplanon insertion 12/10/2017  . Preeclampsia in postpartum period 10/23/2017  . Severe preeclampsia 10/18/2017  . Preeclampsia, severe, third trimester 10/18/2017    Past Surgical History:  Procedure Laterality Date  . NO PAST SURGERIES       OB History    Gravida  3   Para  2   Term  1   Preterm  1   AB  1   Living  2     SAB  1   TAB      Ectopic      Multiple  0     Live Births  2           Family History  Problem Relation Age of Onset  . Cancer Maternal Grandfather        skin  . Pyloric stenosis Son   . Irritable bowel syndrome Paternal Aunt   . Crohn's disease Paternal Aunt   . Heart disease Maternal Aunt   . Heart disease Maternal Uncle   . Hepatitis Maternal Uncle        viral    Social History   Tobacco Use  . Smoking status: Never Smoker  . Smokeless tobacco: Never Used  Vaping Use  . Vaping Use: Never used  Substance Use Topics  . Alcohol use: No  . Drug use: No    Home Medications Prior to Admission medications   Medication Sig Start Date End Date Taking? Authorizing Provider  escitalopram (LEXAPRO) 10 MG tablet TAKE 1 TABLET BY MOUTH EVERY DAY 08/03/19   Cresenzo-Dishmon, Scarlette Calico, CNM  etonogestrel (NEXPLANON) 68 MG IMPL implant 1 each by Subdermal route once.    [provider]  naproxen (NAPROSYN) 500 MG tablet Take 1 tablet (500 mg total) by mouth 2 (two) times daily between meals as needed for moderate pain.  03/10/20   Lorelee NewGreen, Kawanna Christley L, PA-C  oxyCODONE-acetaminophen (PERCOCET/ROXICET) 5-325 MG tablet Take 1 tablet by mouth every 4 (four) hours as needed. 02/11/19   Burgess AmorIdol, Julie, PA-C  oxyCODONE-acetaminophen (PERCOCET/ROXICET) 5-325 MG tablet Take 1 tablet by mouth every 4 (four) hours as needed for severe pain. 02/11/19   Burgess AmorIdol, Julie, PA-C  potassium chloride SA (K-DUR) 20 MEQ tablet Take 1 tablet (20 mEq total) by mouth 2 (two) times daily. 02/11/19   Petrucelli, Samantha R, PA-C    Allergies    Vancomycin  Review of Systems   Review of Systems  All other systems reviewed and are negative.   Physical Exam Updated Vital Signs BP 115/87   Pulse (!) 122   Temp (!) 101.6 F (38.7 C) (Oral)   Resp 20   Ht 5\' 9"  (1.753 m)   Wt 79.4 kg   SpO2 99%   BMI 25.84 kg/m   Physical Exam Vitals and nursing note reviewed. Exam conducted with a chaperone present.  Constitutional:      General: She is not in  acute distress.    Appearance: She is ill-appearing. She is not diaphoretic.  HENT:     Head: Normocephalic and atraumatic.  Eyes:     General: No scleral icterus.    Conjunctiva/sclera: Conjunctivae normal.  Cardiovascular:     Rate and Rhythm: Normal rate and regular rhythm.     Pulses: Normal pulses.     Heart sounds: Normal heart sounds.  Pulmonary:     Comments: No increased work of breathing.  Breath sounds intact bilaterally.  Symmetric chest rise.  No rales or wheezing appreciated.  No reproducible chest wall tenderness to palpation. Abdominal:     General: Abdomen is flat. There is no distension.     Palpations: Abdomen is soft.     Tenderness: There is no abdominal tenderness.  Musculoskeletal:     Cervical back: Normal range of motion. No rigidity.     Right lower leg: No edema.     Left lower leg: No edema.     Comments: No TTP in calves/lower extremities.  No asymmetries or edema noted.  Skin:    General: Skin is dry.     Capillary Refill: Capillary refill takes less than 2 seconds.  Neurological:     Mental Status: She is alert and oriented to person, place, and time.     GCS: GCS eye subscore is 4. GCS verbal subscore is 5. GCS motor subscore is 6.  Psychiatric:        Mood and Affect: Mood normal.        Behavior: Behavior normal.        Thought Content: Thought content normal.     ED Results / Procedures / Treatments   Labs (all labs ordered are listed, but only abnormal results are displayed) Labs Reviewed  BASIC METABOLIC PANEL - Abnormal; Notable for the following components:      Result Value   Sodium 132 (*)    CO2 20 (*)    All other components within normal limits  D-DIMER, QUANTITATIVE (NOT AT [facility]) - Abnormal; Notable for the following components:   D-Dimer, Quant 4.21 (*)    All other components within normal limits  CBC  PREGNANCY, URINE  POC URINE PREG, ED  TROPONIN I (HIGH SENSITIVITY)  TROPONIN I (HIGH SENSITIVITY)     EKG None  Radiology CT Angio Chest PE W/Cm &/Or Wo Cm  Result Date: 03/10/2020 CLINICAL DATA:  PE suspected.  Positive COVID test. EXAM: CT ANGIOGRAPHY CHEST WITH CONTRAST TECHNIQUE: Multidetector CT imaging of the chest was performed using the standard protocol during bolus administration of intravenous contrast. Multiplanar CT image reconstructions and MIPs were obtained to evaluate the vascular anatomy. CONTRAST:  86mL OMNIPAQUE IOHEXOL 350 MG/ML SOLN COMPARISON:  None. FINDINGS: Cardiovascular: Evaluation was limited by respiratory motion artifact. There is no pulmonary embolus or evidence of right heart strain. The size of the main pulmonary artery is normal. Heart size is normal, with no pericardial effusion. The course and caliber of the aorta are normal. There is no atherosclerotic calcification. Opacification decreased due to pulmonary arterial phase contrast bolus timing. Mediastinum/Nodes: -- No mediastinal lymphadenopathy. -- No hilar lymphadenopathy. -- No axillary lymphadenopathy. -- No supraclavicular lymphadenopathy. -- Normal thyroid gland where visualized. -  Unremarkable esophagus. Lungs/Pleura: There is a trace right-sided pleural effusion. The lungs are otherwise clear. There is no focal infiltrate or pneumothorax. Upper Abdomen: Contrast bolus timing is not optimized for evaluation of the abdominal organs. The visualized portions of the organs of the upper abdomen are normal. Musculoskeletal: No chest wall abnormality. No bony spinal canal stenosis. Review of the MIP images confirms the above findings. IMPRESSION: No pulmonary embolism. Trace right-sided pleural effusion. Electronically Signed   By: Katherine Mantle M.D.   On: 03/10/2020 15:58   DG Chest Portable 1 View  Result Date: 03/10/2020 CLINICAL DATA:  Chest pain EXAM: PORTABLE CHEST 1 VIEW COMPARISON:  None. FINDINGS: Lungs are clear. Heart size and pulmonary vascularity are normal. No adenopathy. No pneumothorax. No  bone lesions. IMPRESSION: Lungs clear.  Cardiac silhouette normal. Electronically Signed   By: Bretta Bang III M.D.   On: 03/10/2020 13:21    Procedures Procedures (including critical care time)  Medications Ordered in ED Medications  ketorolac (TORADOL) injection 60 mg (60 mg Intramuscular Given 03/10/20 1420)  acetaminophen (TYLENOL) tablet 650 mg (650 mg Oral Given 03/10/20 1612)  iohexol (OMNIPAQUE) 350 MG/ML injection 75 mL (75 mLs Intravenous Contrast Given 03/10/20 1544)    ED Course  I have reviewed the triage vital signs and the nursing notes.  Pertinent labs & imaging results that were available during my care of the patient were reviewed by me and considered in my medical decision making (see chart for details).    MDM Rules/Calculators/A&P                          Given patient's chief complaint of chest pain and back pain in the context of COVID-19 infection, will obtain comprehensive work-up including troponin and D-dimer.  Patient states that she "seems to remember having a blood clot" during pregnancy, but cannot tell me if she was ever anticoagulated.  While she only had HR that was borderline at 98 in triage, during my examination her heart was racing.  She states that she has been having constant chest and back pain and that when she ambulates she gets weak, short of breath, and lightheaded.  Labs CBC: Unremarkable. BMP: Mild hyponatremia 132, otherwise unremarkable. Troponin: Less than 2, do not need to trend. D-dimer: Elevated to 4.21.  While 25% of COVID-19 patients will have an elevated D-dimer, less than 10% have D-dimer that is more than twice upper limit of normal.  Given patient's complaints here today in the context of COVID-19 infection and D-dimer that is greater than 8 times upper normal limit, discussed risks and benefits of CTA and will move forward with PE study.  Repeat vitals noted tachycardia to 122, granted patient also febrile at 101.6  F.  Imaging DG chest is personally reviewed and there is no evidence of acute cardiopulmonary findings. CT angio chest PE study: No pulmonary embolism.  Patient does feel mildly improved after receiving administration of 60 mg Toradol IM.  While she is experiencing chest and back pain in the context of active COVID-19 infection, I have lower suspicion for PE, pericarditis, or myocarditis given today's comprehensive work-up.  No diffuse ST elevation on EKG.  Troponin was trended x2 and WNL.  Patient is reasonable for discharge home and continued ibuprofen/NSAIDs as needed for body aches and fever control.  She is reassured by today's evaluation and agrees with assessment and plan.  She will maintain isolation precautions.  She has a thermometer at home.  She will continue to hydrate.  MARSHAE AZAM was evaluated in Emergency Department on 03/10/2020 for the symptoms described in the history of present illness. She was evaluated in the context of the global COVID-19 pandemic, which necessitated consideration that the patient might be at risk for infection with the SARS-CoV-2 virus that causes COVID-19. Institutional protocols and algorithms that pertain to the evaluation of patients at risk for COVID-19 are in a state of rapid change based on information released by regulatory bodies including the CDC and federal and state organizations. These policies and algorithms were followed during the patient's care in the ED.   Final Clinical Impression(s) / ED Diagnoses Final diagnoses:  COVID-19  Nonspecific chest pain    Rx / DC Orders ED Discharge Orders         Ordered    naproxen (NAPROSYN) 500 MG tablet  2 times daily between meals PRN     Discontinue  Reprint     03/10/20 1706           Lorelee New, PA-C 03/10/20 1706    Lorelee New, PA-C 03/10/20 1708    Derwood Kaplan, MD 03/11/20 1046

## 2020-03-10 NOTE — ED Triage Notes (Signed)
Pt reports tested positive for covid yesterday.  C/O chest heaviness, room spinning, and back pain.  Reports had had 1st dose of vaccine 2 weeks ago.

## 2020-07-18 ENCOUNTER — Other Ambulatory Visit: Payer: Self-pay

## 2020-07-18 ENCOUNTER — Encounter (HOSPITAL_COMMUNITY): Payer: Self-pay | Admitting: Emergency Medicine

## 2020-07-18 ENCOUNTER — Emergency Department (HOSPITAL_COMMUNITY)
Admission: EM | Admit: 2020-07-18 | Discharge: 2020-07-19 | Disposition: A | Discharge status: Home / Self Care | Payer: 59 | Attending: Emergency Medicine | Admitting: Emergency Medicine

## 2020-07-18 DIAGNOSIS — R109 Unspecified abdominal pain: Secondary | ICD-10-CM | POA: Diagnosis not present

## 2020-07-18 DIAGNOSIS — Z5321 Procedure and treatment not carried out due to patient leaving prior to being seen by health care provider: Secondary | ICD-10-CM | POA: Insufficient documentation

## 2020-07-18 NOTE — ED Triage Notes (Signed)
Pt c/o abd pain intermittently for a while, but has been unable to eat or drink last couple of days due to pain.

## 2020-07-19 LAB — COMPREHENSIVE METABOLIC PANEL
ALT: 30 U/L (ref 0–44)
AST: 48 U/L — ABNORMAL HIGH (ref 15–41)
Albumin: 4.3 g/dL (ref 3.5–5.0)
Alkaline Phosphatase: 116 U/L (ref 38–126)
Anion gap: 8 (ref 5–15)
BUN: 14 mg/dL (ref 6–20)
CO2: 25 mmol/L (ref 22–32)
Calcium: 9.2 mg/dL (ref 8.9–10.3)
Chloride: 103 mmol/L (ref 98–111)
Creatinine, Ser: 0.76 mg/dL (ref 0.44–1.00)
GFR, Estimated: >60 mL/min (ref >60)
Glucose, Bld: 90 mg/dL (ref 70–99)
Potassium: 3.6 mmol/L (ref 3.5–5.1)
Sodium: 136 mmol/L (ref 135–145)
Total Bilirubin: 0.5 mg/dL (ref 0.3–1.2)
Total Protein: 8 g/dL (ref 6.5–8.1)

## 2020-07-19 LAB — CBC
HCT: 42.8 % (ref 36.0–46.0)
Hemoglobin: 13.5 g/dL (ref 12.0–15.0)
MCH: 27.1 pg (ref 26.0–34.0)
MCHC: 31.5 g/dL (ref 30.0–36.0)
MCV: 85.8 fL (ref 80.0–100.0)
Platelets: 351 10*3/uL (ref 150–400)
RBC: 4.99 MIL/uL (ref 3.87–5.11)
RDW: 13.7 % (ref 11.5–15.5)
WBC: 7.5 10*3/uL (ref 4.0–10.5)
nRBC: 0 % (ref 0.0–0.2)

## 2020-07-19 LAB — LIPASE, BLOOD: Lipase: 31 U/L (ref 11–51)

## 2020-07-20 ENCOUNTER — Other Ambulatory Visit (HOSPITAL_COMMUNITY): Payer: Self-pay | Admitting: Family Medicine

## 2020-07-20 DIAGNOSIS — R1013 Epigastric pain: Secondary | ICD-10-CM

## 2020-07-22 ENCOUNTER — Other Ambulatory Visit: Payer: Self-pay

## 2020-07-22 ENCOUNTER — Ambulatory Visit (HOSPITAL_COMMUNITY)
Admission: RE | Admit: 2020-07-22 | Discharge: 2020-07-22 | Disposition: A | Discharge status: Home / Self Care | Payer: 59 | Source: Ambulatory Visit | Attending: Family Medicine | Admitting: Family Medicine

## 2020-07-22 DIAGNOSIS — R1013 Epigastric pain: Secondary | ICD-10-CM | POA: Insufficient documentation

## 2020-08-03 ENCOUNTER — Ambulatory Visit
Admission: EM | Admit: 2020-08-03 | Discharge: 2020-08-03 | Disposition: A | Discharge status: Home / Self Care | Payer: 59 | Attending: Family Medicine | Admitting: Family Medicine

## 2020-08-03 ENCOUNTER — Ambulatory Visit (INDEPENDENT_AMBULATORY_CARE_PROVIDER_SITE_OTHER): Payer: 59

## 2020-08-03 ENCOUNTER — Encounter: Payer: Self-pay | Admitting: Emergency Medicine

## 2020-08-03 ENCOUNTER — Other Ambulatory Visit: Payer: Self-pay

## 2020-08-03 DIAGNOSIS — M7989 Other specified soft tissue disorders: Secondary | ICD-10-CM

## 2020-08-03 DIAGNOSIS — M79644 Pain in right finger(s): Secondary | ICD-10-CM | POA: Diagnosis not present

## 2020-08-03 DIAGNOSIS — S6991XA Unspecified injury of right wrist, hand and finger(s), initial encounter: Secondary | ICD-10-CM

## 2020-08-03 NOTE — ED Triage Notes (Signed)
Pain and swelling to RT thumb after slamming it in a car door yesterday

## 2020-08-03 NOTE — ED Provider Notes (Signed)
[facility] CARE CENTER   [id number] 08/03/20 Arrival Time: 1117  AF:BXUXY PAIN  SUBJECTIVE: History from: patient. Melanie Price is a 29 y.o. female complains of right thumb pain since yesterday.  Reports that she slammed her thumb in a car door. Describes the pain as constant and achy in character.  Has not tried OTC medications without relief.  Symptoms are made worse with activity.  Denies similar symptoms in the past.  Denies fever, chills, erythema,  weakness, numbness and tingling, saddle paresthesias, loss of bowel or bladder function.      ROS: As per HPI.  All other pertinent ROS negative.     Past Medical History:  Diagnosis Date   Anemia    Bleeding in early pregnancy 12/19/2015   Contraceptive management 09/26/2015   Depression    post-partum depression   Encounter for Nexplanon removal 09/26/2015   Fatty liver 05/15/2018   Refer to GI    Headache    migraine   Miscarriage 12/21/2015   Preeclampsia    Preeclampsia    Past Surgical History:  Procedure Laterality Date   NO PAST SURGERIES     Allergies  Allergen Reactions   Vancomycin Itching and Dermatitis    Scalp itching, neck and ear erythema.   No current facility-administered medications on file prior to encounter.   Current Outpatient Medications on File Prior to Encounter  Medication Sig Dispense Refill   escitalopram (LEXAPRO) 10 MG tablet TAKE 1 TABLET BY MOUTH EVERY DAY 90 tablet 3   etonogestrel (NEXPLANON) 68 MG IMPL implant 1 each by Subdermal route once.     naproxen (NAPROSYN) 500 MG tablet Take 1 tablet (500 mg total) by mouth 2 (two) times daily between meals as needed for moderate pain. 30 tablet 0   oxyCODONE-acetaminophen (PERCOCET/ROXICET) 5-325 MG tablet Take 1 tablet by mouth every 4 (four) hours as needed. 15 tablet 0   oxyCODONE-acetaminophen (PERCOCET/ROXICET) 5-325 MG tablet Take 1 tablet by mouth every 4 (four) hours as needed for severe pain. 6 tablet 0   potassium  chloride SA (K-DUR) 20 MEQ tablet Take 1 tablet (20 mEq total) by mouth 2 (two) times daily. 5 tablet 0   Social History   Socioeconomic History   Marital status: Married    Spouse name: Not on file   Number of children: Not on file   Years of education: Not on file   Highest education level: Not on file  Occupational History   Not on file  Tobacco Use   Smoking status: Never Smoker   Smokeless tobacco: Never Used  Vaping Use   Vaping Use: Never used  Substance and Sexual Activity   Alcohol use: No   Drug use: No   Sexual activity: Yes    Birth control/protection: Implant  Other Topics Concern   Not on file  Social History Narrative   Not on file   Social Determinants of Health   Financial Resource Strain: Not on file  Food Insecurity: Not on file  Transportation Needs: Not on file  Physical Activity: Not on file  Stress: Not on file  Social Connections: Not on file  Intimate Partner Violence: Not on file   Family History  Problem Relation Age of Onset   Cancer Maternal Grandfather        skin   Pyloric stenosis Son    Irritable bowel syndrome Paternal Aunt    Crohn's disease Paternal Aunt    Heart disease Maternal Aunt    Heart disease  Maternal Uncle    Hepatitis Maternal Uncle        viral    OBJECTIVE:  Vitals:   08/03/20 1229 08/03/20 1230  BP:  (!) 106/54  Pulse:  92  Resp:  17  Temp:  98.6 F (37 C)  TempSrc:  Oral  SpO2:  98%  Weight: 170 lb (77.1 kg)   Height: 5\' 8"  (1.727 m)     General appearance: ALERT; in no acute distress.  Head: NCAT Lungs: Normal respiratory effort CV:  pulses 2+ bilaterally. Cap refill < 2 seconds Musculoskeletal:  Inspection: Skin warm, dry, clear and intact.  Ecchymosis to thumbnail, swelling to DIP joint of right thumb Palpation: right thumb tender to palpation ROM: Limited ROM active and passive to Skin: warm and dry Neurologic: Ambulates without difficulty; Sensation intact about the  upper/ lower extremities Psychological: alert and cooperative; normal mood and affect  DIAGNOSTIC STUDIES:  DG Finger Thumb Right  Result Date: 08/03/2020 CLINICAL DATA:  Pain and swelling after injury yesterday EXAM: RIGHT THUMB 2+V COMPARISON:  None FINDINGS: Osseous alignment is normal. No acute appearing fracture line or displaced fracture fragment is identified. No degenerative change. Soft tissues about the RIGHT thumb are unremarkable. IMPRESSION: Negative. Electronically Signed   By: 08/05/2020 M.D.   On: 08/03/2020 12:51     ASSESSMENT & PLAN:  1. Pain of right thumb    X-ray of right thumb is negative for any fractures or misalignments today Continue conservative management of rest, ice, and gentle stretches Take ibuprofen as needed for pain relief (may cause abdominal discomfort, ulcers, and GI bleeds avoid taking with other NSAIDs) May also use ice to the area to help decrease swelling and control pain Follow up with PCP if symptoms persist Return or go to the ER if you have any new or worsening symptoms (fever, chills, chest pain, abdominal pain, changes in bowel or bladder habits, pain radiating into lower legs)   Reviewed expectations re: course of current medical issues. Questions answered. Outlined signs and symptoms indicating need for more acute intervention. Patient verbalized understanding. After Visit Summary given.       08/05/2020, NP 08/03/20 1309

## 2020-08-03 NOTE — Discharge Instructions (Signed)
Xray is negative for any fracture or misalignment in the thumb  May take ibuprofen or tylenol for pain  Follow up with this office or with primary care if symptoms are persisting.  Follow up in the ER for high fever, trouble swallowing, trouble breathing, other concerning symptoms.

## 2020-08-16 ENCOUNTER — Other Ambulatory Visit: Payer: Self-pay

## 2020-08-16 ENCOUNTER — Encounter: Payer: Self-pay | Admitting: Women's Health

## 2020-08-16 ENCOUNTER — Ambulatory Visit (INDEPENDENT_AMBULATORY_CARE_PROVIDER_SITE_OTHER): Payer: 59 | Admitting: Women's Health

## 2020-08-16 VITALS — BP 119/64 | HR 81 | Ht 68.0 in | Wt 179.3 lb

## 2020-08-16 DIAGNOSIS — Z3046 Encounter for surveillance of implantable subdermal contraceptive: Secondary | ICD-10-CM | POA: Diagnosis not present

## 2020-08-16 NOTE — Progress Notes (Signed)
   NEXPLANON REMOVAL Patient name: Melanie Price MRN [id number]  Date of birth: [birth year] Subjective Findings:   Melanie Price is a 30 y.o. [phone] Caucasian female being seen today for removal of a Nexplanon. Her Nexplanon was placed 12/10/17.  She desires removal because husband had vasectomy. Signed copy of informed consent in chart.   No LMP recorded. Patient has had an implant. Last pap2018. Results were:  normal The planned method of family planning is vasectomy Depression screen [facility] 2/9 04/16/2017 03/05/2017 11/20/2016  Decreased Interest 2 0 3  Down, Depressed, Hopeless 1 0 2  PHQ - 2 Score 3 0 5  Altered sleeping 2 - 3  Tired, decreased energy 3 - 3  Change in appetite 3 - 3  Feeling bad or failure about yourself  1 - 3  Trouble concentrating 1 - 2  Moving slowly or fidgety/restless 0 - 1  Suicidal thoughts 0 - 1  PHQ-9 Score 13 - 21  Difficult doing work/chores Extremely dIfficult - Very difficult    Pertinent History Reviewed:   Reviewed past medical,surgical, social, obstetrical and family history.  Reviewed problem list, medications and allergies. Objective Findings & Procedure:    Vitals:   08/16/20 1128  BP: 119/64  Pulse: 81  Weight: 179 lb 4.8 oz (81.3 kg)  Height: 5\' 8"  (1.727 m)  Body mass index is 27.26 kg/m.  No results found for this or any previous visit (from the past 24 hour(s)).   Time out was performed.  Nexplanon site identified.  Area prepped in usual sterile fashon. One cc of 2% lidocaine was used to anesthetize the area at the distal end of the implant. A small stab incision was made right beside the implant on the distal portion.  The Nexplanon rod was grasped using hemostats and removed without difficulty.  There was less than 3 cc blood loss. There were no complications.  Steri-strips were applied over the small incision and a pressure bandage was applied.  The patient tolerated the procedure well. Assessment & Plan:   1) Nexplanon removal She  was instructed to keep the area clean and dry, remove pressure bandage in 24 hours, and keep insertion site covered with the steri-strip for 3-5 days.   Follow-up PRN problems.  No orders of the defined types were placed in this encounter.   Follow-up: Return for 1st available, Pap & physical.  CNM, [facility] 08/16/2020 12:07 PM

## 2020-08-31 ENCOUNTER — Ambulatory Visit: Payer: 59 | Admitting: Gastroenterology

## 2020-09-05 ENCOUNTER — Other Ambulatory Visit: Payer: 59 | Admitting: Women's Health

## 2020-10-06 ENCOUNTER — Encounter: Payer: Self-pay | Admitting: Internal Medicine

## 2020-10-06 ENCOUNTER — Ambulatory Visit: Payer: 59 | Admitting: Nurse Practitioner

## 2020-10-06 NOTE — Progress Notes (Deleted)
Referring Provider: [facility]* Primary Care Physician:  [facility]:  Dr. Gala Romney  No chief complaint on file.   HPI:   Melanie Price is a 30 y.o. female who presents on referral from primary care for abdominal pain.  Recently completed right upper quadrant ultrasound for similar complaints was normal.  The patient was last seen in our office 06/24/2018 for fatty liver disease.  Noted right upper quadrant pain at that time as well.  Noted elevated LFTs including AST/ALT of 59/49 that was improved compared to previous values, alk phos elevated at 156 also improved.  Hepatitis panel negative.  Abdominal ultrasound 2019 with mild fatty infiltration of the liver otherwise normal.  At her last visit noted right upper quadrant pain 3 months prior and ongoing described as crampy.  However, recently it had resolved.  Notes "tight, gripping" chest pain intermittently as often as twice a week to once a year.  No other GERD symptoms.  Has not told primary care/OB/GYN about her chest pain.  No other overt GI complaints.  She notes she was previously told she has IBS and has "stomach issues" but unable to elaborate.  Recommended labs, referral to dietitian for diet and weight loss to help prevent fatty liver disease, establish with primary care, follow-up in 6 months.  Labs are completed 06/24/2018.  CMP with mildly elevated but improved alk phos of 119, mildly elevated but improved AST/ALT of 48/33, very mild elevation of GGT at 46 (ULN 40), lipid panel with elevated triglycerides at 152 but normal cholesterol at 171.  LDL elevated at 109.  Recommended continue diet and exercise for fatty liver disease.  The patient was a no-show to her follow-up visit scheduled 01/07/2019.  Today she states doing okay overall.  Past Medical History:  Diagnosis Date  . Anemia   . Bleeding in early pregnancy 12/19/2015  . Contraceptive management 09/26/2015  . Depression     post-partum depression  . Encounter for Nexplanon removal 09/26/2015  . Fatty liver 05/15/2018   Refer to GI   . Headache    migraine  . Miscarriage 12/21/2015  . Preeclampsia   . Preeclampsia     Past Surgical History:  Procedure Laterality Date  . NO PAST SURGERIES      Current Outpatient Medications  Medication Sig Dispense Refill  . escitalopram (LEXAPRO) 10 MG tablet TAKE 1 TABLET BY MOUTH EVERY DAY (Patient not taking: Reported on 08/16/2020) 90 tablet 3  . etonogestrel (NEXPLANON) 68 MG IMPL implant 1 each by Subdermal route once.    . naproxen (NAPROSYN) 500 MG tablet Take 1 tablet (500 mg total) by mouth 2 (two) times daily between meals as needed for moderate pain. (Patient not taking: Reported on 08/16/2020) 30 tablet 0  . oxyCODONE-acetaminophen (PERCOCET/ROXICET) 5-325 MG tablet Take 1 tablet by mouth every 4 (four) hours as needed. (Patient not taking: Reported on 08/16/2020) 15 tablet 0  . oxyCODONE-acetaminophen (PERCOCET/ROXICET) 5-325 MG tablet Take 1 tablet by mouth every 4 (four) hours as needed for severe pain. (Patient not taking: Reported on 08/16/2020) 6 tablet 0  . potassium chloride SA (K-DUR) 20 MEQ tablet Take 1 tablet (20 mEq total) by mouth 2 (two) times daily. (Patient not taking: Reported on 08/16/2020) 5 tablet 0  . venlafaxine XR (EFFEXOR-XR) 150 MG 24 hr capsule Take 150 mg by mouth daily with breakfast. Then 75 mg venlafaxine after lunch     No current facility-administered  medications for this visit.    Allergies as of 10/06/2020 - Review Complete 08/16/2020  Allergen Reaction Noted  . Vancomycin Itching and Dermatitis 12/09/2018    Family History  Problem Relation Age of Onset  . Cancer Maternal Grandfather        skin  . Pyloric stenosis Son   . Irritable bowel syndrome Paternal Aunt   . Crohn's disease Paternal Aunt   . Heart disease Maternal Aunt   . Heart disease Maternal Uncle   . Hepatitis Maternal Uncle        viral    Social  History   Socioeconomic History  . Marital status: Married    Spouse name: Not on file  . Number of children: Not on file  . Years of education: Not on file  . Highest education level: Not on file  Occupational History  . Not on file  Tobacco Use  . Smoking status: Never Smoker  . Smokeless tobacco: Never Used  Vaping Use  . Vaping Use: Never used  Substance and Sexual Activity  . Alcohol use: No  . Drug use: No  . Sexual activity: Yes    Birth control/protection: Implant  Other Topics Concern  . Not on file  Social History Narrative  . Not on file   Social Determinants of Health   Financial Resource Strain: Not on file  Food Insecurity: Not on file  Transportation Needs: Not on file  Physical Activity: Not on file  Stress: Not on file  Social Connections: Not on file    Subjective:*** Review of Systems  Constitutional: Negative for chills, fever, malaise/fatigue and weight loss.  HENT: Negative for congestion and sore throat.   Respiratory: Negative for cough and shortness of breath.   Cardiovascular: Negative for chest pain and palpitations.  Gastrointestinal: Negative for abdominal pain, blood in stool, diarrhea, melena, nausea and vomiting.  Musculoskeletal: Negative for joint pain and myalgias.  Skin: Negative for rash.  Neurological: Negative for dizziness and weakness.  Endo/Heme/Allergies: Does not bruise/bleed easily.  Psychiatric/Behavioral: Negative for depression. The patient is not nervous/anxious.   All other systems reviewed and are negative.    Objective: There were no vitals taken for this visit. Physical Exam Vitals and nursing note reviewed.  Constitutional:      General: She is not in acute distress.    Appearance: Normal appearance. She is well-developed. She is not ill-appearing, toxic-appearing or diaphoretic.  HENT:     Head: Normocephalic and atraumatic.     Nose: No congestion or rhinorrhea.  Eyes:     General: No scleral  icterus. Cardiovascular:     Rate and Rhythm: Normal rate and regular rhythm.     Heart sounds: Normal heart sounds.  Pulmonary:     Effort: Pulmonary effort is normal. No respiratory distress.     Breath sounds: Normal breath sounds.  Abdominal:     General: Bowel sounds are normal.     Palpations: Abdomen is soft. There is no hepatomegaly, splenomegaly or mass.     Tenderness: There is no abdominal tenderness. There is no guarding or rebound.     Hernia: No hernia is present.  Skin:    General: Skin is warm and dry.     Coloration: Skin is not jaundiced.     Findings: No rash.  Neurological:     General: No focal deficit present.     Mental Status: She is alert and oriented to person, place, and time.  Psychiatric:  Attention and Perception: Attention normal.        Mood and Affect: Mood normal.        Speech: Speech normal.        Behavior: Behavior normal.        Thought Content: Thought content normal.        Cognition and Memory: Cognition and memory normal.      Assessment:  ***   Plan: ***    Thank you for allowing [place] to participate in the care of Regla C Deatra Robinson, DNP, AGNP-C Adult & Gerontological Nurse Practitioner [facility]   10/06/2020 1:23 PM   Disclaimer: This note was dictated with voice recognition software. Similar sounding words can inadvertently be transcribed and may not be corrected upon review.

## 2021-02-22 ENCOUNTER — Other Ambulatory Visit: Payer: 59 | Admitting: Women's Health

## 2021-05-23 ENCOUNTER — Telehealth: Payer: 59 | Admitting: Nurse Practitioner

## 2021-05-23 ENCOUNTER — Ambulatory Visit
Admission: EM | Admit: 2021-05-23 | Discharge: 2021-05-23 | Disposition: A | Discharge status: Home / Self Care | Payer: 59 | Attending: Family Medicine | Admitting: Family Medicine

## 2021-05-23 ENCOUNTER — Other Ambulatory Visit: Payer: Self-pay

## 2021-05-23 DIAGNOSIS — N3090 Cystitis, unspecified without hematuria: Secondary | ICD-10-CM | POA: Insufficient documentation

## 2021-05-23 DIAGNOSIS — M545 Low back pain, unspecified: Secondary | ICD-10-CM

## 2021-05-23 DIAGNOSIS — R399 Unspecified symptoms and signs involving the genitourinary system: Secondary | ICD-10-CM

## 2021-05-23 DIAGNOSIS — R300 Dysuria: Secondary | ICD-10-CM | POA: Diagnosis not present

## 2021-05-23 LAB — POCT URINALYSIS DIP (MANUAL ENTRY)
Glucose, UA: 100 mg/dL — AB
Nitrite, UA: POSITIVE — AB
Protein Ur, POC: >=300 mg/dL — AB
Spec Grav, UA: >=1.03 — AB (ref 1.010–1.025)
Urobilinogen, UA: 2 E.U./dL — AB
pH, UA: 5 (ref 5.0–8.0)

## 2021-05-23 MED ORDER — CEPHALEXIN 500 MG PO CAPS
500.0000 mg | ORAL_CAPSULE | Freq: Two times a day (BID) | ORAL | 0 refills | Status: DC
Start: 2021-05-23 — End: 2023-11-13

## 2021-05-23 NOTE — Discharge Instructions (Addendum)
You have had labs (urine culture) sent today. We will call you with any significant abnormalities or if there is need to begin or change treatment or pursue further follow up.  You may also review your test results online through MyChart. If you do not have a MyChart account, instructions to sign up should be on your discharge paperwork.  

## 2021-05-23 NOTE — Progress Notes (Signed)
Based on what you shared with me it looks like you have uti symptoms with back pain,that should be evaluated in a face to face office visit. Due to the associating back pain you will need a urinalysis and urine culture for proper treatment. NOTE: There will be NO CHARGE for this eVisit   If you are having a true medical emergency please call 911.      For an urgent face to face visit, [facility] has six urgent care centers for your convenience:     [facility] [place] Get Driving Directions [phone] [address] [place], [place] [address]    [facility] ([place]) Get Driving Directions [phone] [address] [place], [place] [address]  [facility] ([place]) Get Driving Directions [phone] [address] [place],  [place]  [address]  [facility] Get Driving Directions [phone] [address] [place], [place] [address]   [facility] [place] Get Driving Directions  [phone] [address] [place], [place] [address]   [facility] [place] Get Driving Directions [phone] [address] [place], [place] [address]  Your MyChart E-visit questionnaire answers were reviewed by a board certified advanced clinical practitioner to complete your personal care plan based on your specific symptoms.  Thank you for using e-Visits.         

## 2021-05-23 NOTE — ED Triage Notes (Signed)
Pt presents with dysuria, back pain, and abdominal pain x5 days

## 2021-05-23 NOTE — ED Provider Notes (Signed)
[facility]    ASSESSMENT & PLAN:  1. Dysuria   2. Cystitis    Begin: Meds ordered this encounter  Medications   cephALEXin (KEFLEX) 500 MG capsule    Sig: Take 1 capsule (500 mg total) by mouth 2 (two) times daily.    Dispense:  10 capsule    Refill:  0   No signs of pyelonephritis. Discussed. Urine culture sent.  Will follow up with her PCP or here if not showing improvement over the next 48 hours, sooner if needed.  Outlined signs and symptoms indicating need for more acute intervention. Patient verbalized understanding. After Visit Summary given.  SUBJECTIVE:  Melanie Price is a 30 y.o. female who complains of urinary frequency, urgency and dysuria for the past few d. Without associated flank pain, fever, chills, vaginal discharge or bleeding. Gross hematuria: not present. No specific aggravating or alleviating factors reported. No LE edema. Normal PO intake without n/v/d. Without specific abdominal pain. Ambulatory without difficulty. OTC treatment: AZO with some relief.  LMP: Patient's last menstrual period was 05/07/2021.   OBJECTIVE:  Vitals:   05/23/21 1804  BP: 117/82  Pulse: 74  Resp: 14  Temp: 97.6 F (36.4 C)  TempSrc: Oral  SpO2: 98%   General appearance: alert; no distress Lungs: unlabored respirations Abdomen: soft Back: no CVA tenderness reported Extremities: no edema; symmetrical with no gross deformities Skin: warm and dry Neurologic: normal gait Psychological: alert and cooperative; normal mood and affect  Labs Reviewed  POCT URINALYSIS DIP (MANUAL ENTRY) - Abnormal; Notable for the following components:      Result Value   Color, UA orange (*)    Glucose, UA =100 (*)    Bilirubin, UA small (*)    Ketones, POC UA small (15) (*)    Spec Grav, UA >=1.030 (*)    Blood, UA large (*)    Protein Ur, POC >=300 (*)    Urobilinogen, UA 2.0 (*)    Nitrite, UA Positive (*)    Leukocytes, UA Large (3+) (*)    All other components  within normal limits  URINE CULTURE    Allergies  Allergen Reactions   Vancomycin Itching and Dermatitis    Scalp itching, neck and ear erythema.    Past Medical History:  Diagnosis Date   Anemia    Bleeding in early pregnancy 12/19/2015   Contraceptive management 09/26/2015   Depression    post-partum depression   Encounter for Nexplanon removal 09/26/2015   Fatty liver 05/15/2018   Refer to GI    Headache    migraine   Miscarriage 12/21/2015   Preeclampsia    Preeclampsia    Social History   Socioeconomic History   Marital status: Married    Spouse name: Not on file   Number of children: Not on file   Years of education: Not on file   Highest education level: Not on file  Occupational History   Not on file  Tobacco Use   Smoking status: Never   Smokeless tobacco: Never  Vaping Use   Vaping Use: Never used  Substance and Sexual Activity   Alcohol use: No   Drug use: No   Sexual activity: Yes    Birth control/protection: Implant  Other Topics Concern   Not on file  Social History Narrative   Not on file   Social Determinants of Health   Financial Resource Strain: Not on file  Food Insecurity: Not on file  Transportation Needs: Not  on file  Physical Activity: Not on file  Stress: Not on file  Social Connections: Not on file  Intimate Partner Violence: Not on file   Family History  Problem Relation Age of Onset   Cancer Maternal Grandfather        skin   Pyloric stenosis Son    Irritable bowel syndrome Paternal Aunt    Crohn's disease Paternal Aunt    Heart disease Maternal Aunt    Heart disease Maternal Uncle    Hepatitis Maternal Uncle        viral        Mardella Layman, MD 05/23/21 1902

## 2021-05-25 LAB — URINE CULTURE: Culture: 30000 — AB

## 2021-12-30 ENCOUNTER — Other Ambulatory Visit: Payer: Self-pay

## 2021-12-30 ENCOUNTER — Encounter (HOSPITAL_COMMUNITY): Payer: Self-pay

## 2021-12-30 ENCOUNTER — Emergency Department (HOSPITAL_COMMUNITY)
Admission: EM | Admit: 2021-12-30 | Discharge: 2021-12-30 | Disposition: A | Discharge status: Home / Self Care | Payer: BLUE CROSS/BLUE SHIELD | Attending: Emergency Medicine | Admitting: Emergency Medicine

## 2021-12-30 DIAGNOSIS — J029 Acute pharyngitis, unspecified: Secondary | ICD-10-CM | POA: Diagnosis present

## 2021-12-30 DIAGNOSIS — J020 Streptococcal pharyngitis: Secondary | ICD-10-CM | POA: Diagnosis not present

## 2021-12-30 LAB — GROUP A STREP BY PCR: Group A Strep by PCR: DETECTED — AB

## 2021-12-30 MED ORDER — PENICILLIN G BENZATHINE 1200000 UNIT/2ML IM SUSY
1.2000 10*6.[IU] | PREFILLED_SYRINGE | Freq: Once | INTRAMUSCULAR | Status: AC
  Administered 2021-12-30: 1.2 10*6.[IU] via INTRAMUSCULAR
  Filled 2021-12-30: qty 2

## 2021-12-30 MED ORDER — DEXAMETHASONE SODIUM PHOSPHATE 10 MG/ML IJ SOLN
10.0000 mg | Freq: Once | INTRAMUSCULAR | Status: AC
  Administered 2021-12-30: 10 mg via INTRAMUSCULAR
  Filled 2021-12-30: qty 1

## 2021-12-30 NOTE — ED Provider Notes (Signed)
[facility] EMERGENCY DEPARTMENT Provider Note   CSN: [id number] Arrival date & time: 12/30/21  0225     History  Chief Complaint  Patient presents with   Sore Throat    Melanie Price is a 31 y.o. female.  Presents to the emergency department for evaluation of sore throat.  Patient reports that symptoms have been present for 1 week.      Home Medications Prior to Admission medications   Medication Sig Start Date End Date Taking? Authorizing Provider  cephALEXin (KEFLEX) 500 MG capsule Take 1 capsule (500 mg total) by mouth 2 (two) times daily. 05/23/21   Mardella Layman, MD  Phenazopyridine HCl (AZO TABS PO) Take by mouth.    [provider]      Allergies    Vancomycin    Review of Systems   Review of Systems  Physical Exam Updated Vital Signs BP (!) 81/62 (BP Location: Left Arm)   Pulse 70   Temp 98.1 F (36.7 C) (Oral)   Resp 16   Ht 5\' 8"  (1.727 m)   Wt 81.3 kg   SpO2 100%   BMI 27.25 kg/m  Physical Exam Vitals and nursing note reviewed.  Constitutional:      General: She is not in acute distress.    Appearance: She is well-developed.  HENT:     Head: Normocephalic and atraumatic.     Mouth/Throat:     Mouth: Mucous membranes are moist.     Pharynx: Pharyngeal swelling and posterior oropharyngeal erythema present. No oropharyngeal exudate.     Tonsils: No tonsillar exudate or tonsillar abscesses.  Eyes:     General: Vision grossly intact. Gaze aligned appropriately.     Extraocular Movements: Extraocular movements intact.     Conjunctiva/sclera: Conjunctivae normal.  Cardiovascular:     Rate and Rhythm: Normal rate and regular rhythm.     Pulses: Normal pulses.     Heart sounds: Normal heart sounds, S1 normal and S2 normal. No murmur heard.   No friction rub. No gallop.  Pulmonary:     Effort: Pulmonary effort is normal. No respiratory distress.     Breath sounds: Normal breath sounds.  Abdominal:     General: Bowel sounds are normal.      Palpations: Abdomen is soft.     Tenderness: There is no abdominal tenderness. There is no guarding or rebound.     Hernia: No hernia is present.  Musculoskeletal:        General: No swelling.     Cervical back: Full passive range of motion without pain, normal range of motion and neck supple. No spinous process tenderness or muscular tenderness. Normal range of motion.     Right lower leg: No edema.     Left lower leg: No edema.  Skin:    General: Skin is warm and dry.     Capillary Refill: Capillary refill takes less than 2 seconds.     Findings: No ecchymosis, erythema, rash or wound.  Neurological:     General: No focal deficit present.     Mental Status: She is alert and oriented to person, place, and time.     GCS: GCS eye subscore is 4. GCS verbal subscore is 5. GCS motor subscore is 6.     Cranial Nerves: Cranial nerves 2-12 are intact.     Sensory: Sensation is intact.     Motor: Motor function is intact.     Coordination: Coordination is intact.  Psychiatric:  Attention and Perception: Attention normal.        Mood and Affect: Mood normal.        Speech: Speech normal.        Behavior: Behavior normal.    ED Results / Procedures / Treatments   Labs (all labs ordered are listed, but only abnormal results are displayed) Labs Reviewed  GROUP A STREP BY PCR - Abnormal; Notable for the following components:      Result Value   Group A Strep by PCR DETECTED (*)    All other components within normal limits    EKG None  Radiology No results found.  Procedures Procedures    Medications Ordered in ED Medications  penicillin g benzathine (BICILLIN LA) 1200000 UNIT/2ML injection 1.2 Million Units (has no administration in time range)  dexamethasone (DECADRON) injection 10 mg (has no administration in time range)    ED Course/ Medical Decision Making/ A&P                           Medical Decision Making  Presents with complaints of sore throat.   Symptoms present for 1 week.  No other cold symptoms.  Differential diagnosis includes strep pharyngitis, peritonsillar abscess, viral pharyngitis.  Strep PCR analysis is positive for strep.        Final Clinical Impression(s) / ED Diagnoses Final diagnoses:  Strep pharyngitis    Rx / DC Orders ED Discharge Orders     None         Hong Moring, Canary Brim, MD 12/30/21 [phone]

## 2021-12-30 NOTE — ED Triage Notes (Signed)
Pt reports sore throat x 1 week, says gums have been swollen also.

## 2022-01-23 ENCOUNTER — Ambulatory Visit: Payer: BLUE CROSS/BLUE SHIELD | Admitting: Obstetrics & Gynecology

## 2022-02-26 ENCOUNTER — Ambulatory Visit
Admission: EM | Admit: 2022-02-26 | Discharge: 2022-02-26 | Disposition: A | Discharge status: Home / Self Care | Payer: BLUE CROSS/BLUE SHIELD | Attending: Family Medicine | Admitting: Family Medicine

## 2022-02-26 DIAGNOSIS — H6123 Impacted cerumen, bilateral: Secondary | ICD-10-CM

## 2022-02-26 MED ORDER — CARBAMIDE PEROXIDE 6.5 % OT SOLN: 5.0000 [drp] | Freq: Two times a day (BID) | OTIC | 0 refills | Status: DC

## 2022-02-26 NOTE — ED Triage Notes (Signed)
Pt presents with c/o ear swelling and fullness for past few days

## 2022-02-26 NOTE — ED Provider Notes (Signed)
[facility]    CSN: [id number] Arrival date & time: 02/26/22  1454      History   Chief Complaint Chief Complaint  Patient presents with   Ear Fullness    HPI Melanie Price is a 31 y.o. female.   Presenting today with several day history of left ear muffled hearing, fullness.  Denies drainage, pain, headache, fever, congestion.  Not trying anything over-the-counter for symptoms.    Past Medical History:  Diagnosis Date   Anemia    Bleeding in early pregnancy 12/19/2015   Contraceptive management 09/26/2015   Depression    post-partum depression   Encounter for Nexplanon removal 09/26/2015   Fatty liver 05/15/2018   Refer to GI    Headache    migraine   Miscarriage 12/21/2015   Preeclampsia    Preeclampsia     Patient Active Problem List   Diagnosis Date Noted   Fatty liver 05/15/2018   Kidney stones 05/09/2018   Elevated liver enzymes 05/09/2018   RUQ pain 05/09/2018   Nexplanon insertion 12/10/2017   Preeclampsia in postpartum period 10/23/2017   Severe preeclampsia 10/18/2017   Preeclampsia, severe, third trimester 10/18/2017    Past Surgical History:  Procedure Laterality Date   NO PAST SURGERIES      OB History     Gravida  3   Para  2   Term  1   Preterm  1   AB  1   Living  2      SAB  1   IAB      Ectopic      Multiple  0   Live Births  2            Home Medications    Prior to Admission medications   Medication Sig Start Date End Date Taking? Authorizing Provider  carbamide peroxide (DEBROX) 6.5 % OTIC solution Place 5 drops into both ears 2 (two) times daily. 02/26/22  Yes Particia Nearing, PA-C  cephALEXin (KEFLEX) 500 MG capsule Take 1 capsule (500 mg total) by mouth 2 (two) times daily. 05/23/21   Mardella Layman, MD  Phenazopyridine HCl (AZO TABS PO) Take by mouth.    [provider]    Family History Family History  Problem Relation Age of Onset   Cancer Maternal Grandfather         skin   Pyloric stenosis Son    Irritable bowel syndrome Paternal Aunt    Crohn's disease Paternal Aunt    Heart disease Maternal Aunt    Heart disease Maternal Uncle    Hepatitis Maternal Uncle        viral    Social History Social History   Tobacco Use   Smoking status: Never   Smokeless tobacco: Never  Vaping Use   Vaping Use: Never used  Substance Use Topics   Alcohol use: No   Drug use: No     Allergies   Vancomycin   Review of Systems Review of Systems Per HPI  Physical Exam Triage Vital Signs ED Triage Vitals  Enc Vitals Group     BP 02/26/22 1504 100/67     Pulse Rate 02/26/22 1504 84     Resp 02/26/22 1504 18     Temp 02/26/22 1504 97.9 F (36.6 C)     Temp src --      SpO2 02/26/22 1504 96 %     Weight --      Height --  Head Circumference --      Peak Flow --      Pain Score 02/26/22 1502 3     Pain Loc --      Pain Edu? --      Excl. in GC? --    No data found.  Updated Vital Signs BP 100/67   Pulse 84   Temp 97.9 F (36.6 C)   Resp 18   LMP 02/12/2022 (Approximate)   SpO2 96%   Visual Acuity Right Eye Distance:   Left Eye Distance:   Bilateral Distance:    Right Eye Near:   Left Eye Near:    Bilateral Near:     Physical Exam Vitals and nursing note reviewed.  Constitutional:      Appearance: Normal appearance. She is not ill-appearing.  HENT:     Head: Atraumatic.     Right Ear: There is impacted cerumen.     Left Ear: There is impacted cerumen.     Nose: Nose normal.     Mouth/Throat:     Mouth: Mucous membranes are moist.  Eyes:     Extraocular Movements: Extraocular movements intact.     Conjunctiva/sclera: Conjunctivae normal.  Cardiovascular:     Rate and Rhythm: Normal rate and regular rhythm.     Heart sounds: Normal heart sounds.  Pulmonary:     Effort: Pulmonary effort is normal.     Breath sounds: Normal breath sounds.  Musculoskeletal:        General: Normal range of motion.     Cervical back:  Normal range of motion and neck supple.  Skin:    General: Skin is warm and dry.  Neurological:     Mental Status: She is alert and oriented to person, place, and time.  Psychiatric:        Mood and Affect: Mood normal.        Thought Content: Thought content normal.        Judgment: Judgment normal.    UC Treatments / Results  Labs (all labs ordered are listed, but only abnormal results are displayed) Labs Reviewed - No data to display  EKG   Radiology No results found.  Procedures Procedures (including critical care time)  Medications Ordered in UC Medications - No data to display  Initial Impression / Assessment and Plan / UC Course  I have reviewed the triage vital signs and the nursing notes.  Pertinent labs & imaging results that were available during my care of the patient were reviewed by me and considered in my medical decision making (see chart for details).     Multiple attempts at lavage performed with warm water and peroxide with no full clearance of wax impactions.  Chart Debrox drops, warm water lavage at home and follow-up if unable to clear after a week or so.  Final Clinical Impressions(s) / UC Diagnoses   Final diagnoses:  Bilateral impacted cerumen   Discharge Instructions   None    ED Prescriptions     Medication Sig Dispense Auth. Provider   carbamide peroxide (DEBROX) 6.5 % OTIC solution Place 5 drops into both ears 2 (two) times daily. 15 mL Particia Nearing, [place]      PDMP not reviewed this encounter.   Particia Nearing, [place] 02/26/22 1626

## 2022-04-02 IMAGING — CT CT ANGIO CHEST
2 of 6 series · 18 of 46 positions shown · IV contrast (Omnipaque or Isovue)
Comparison: None.

CLINICAL DATA: PE suspected.  Positive COVID test.

EXAM:
CT ANGIOGRAPHY CHEST WITH CONTRAST
TECHNIQUE: Multidetector CT imaging of the chest was performed using the
standard protocol during bolus administration of intravenous
contrast. Multiplanar CT image reconstructions and MIPs were
obtained to evaluate the vascular anatomy.
CONTRAST:  75mL OMNIPAQUE IOHEXOL 350 MG/ML SOLN

[Series 5: pe axial thins · axial · 0.88mm/px · z∈[+959,+1218]mm · 15 of 285 slices shown]
[im 13/285  lung]
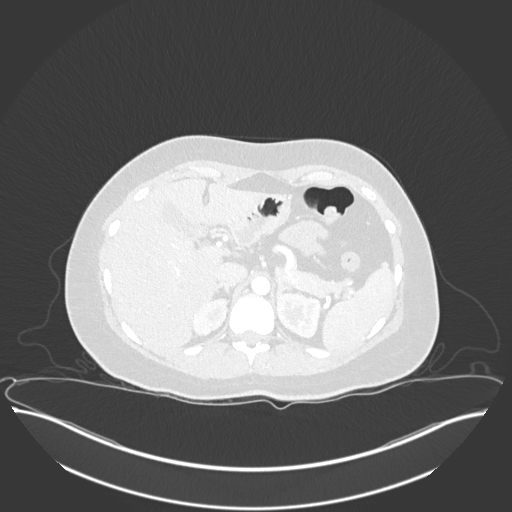
[im 38/285  soft-tissue]
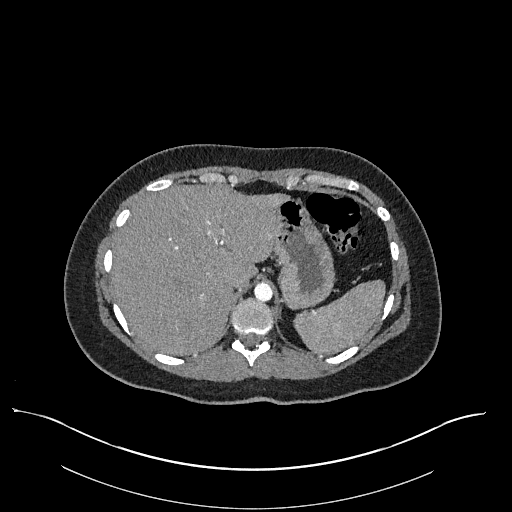
[im 50/285  lung]
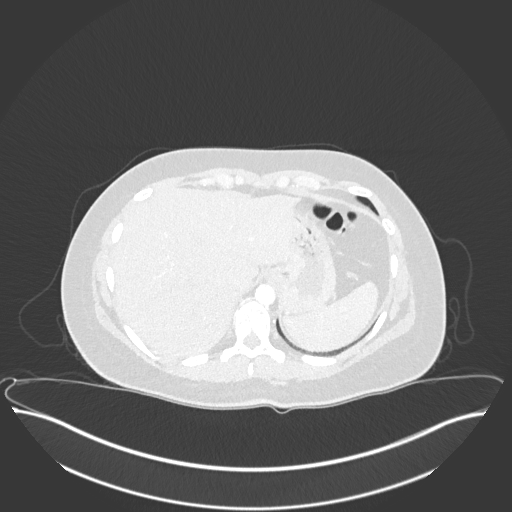
[im 75/285  soft-tissue]
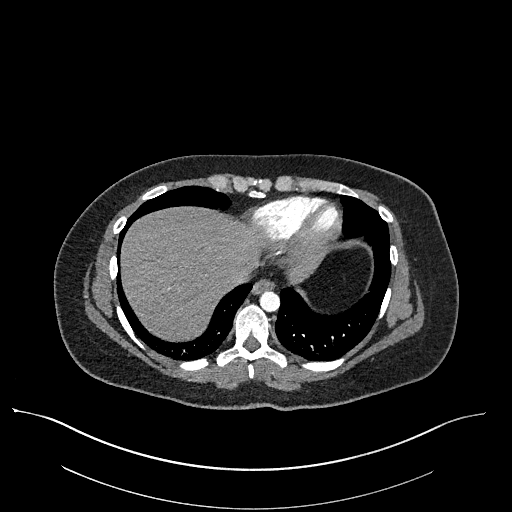
[im 87/285  lung]
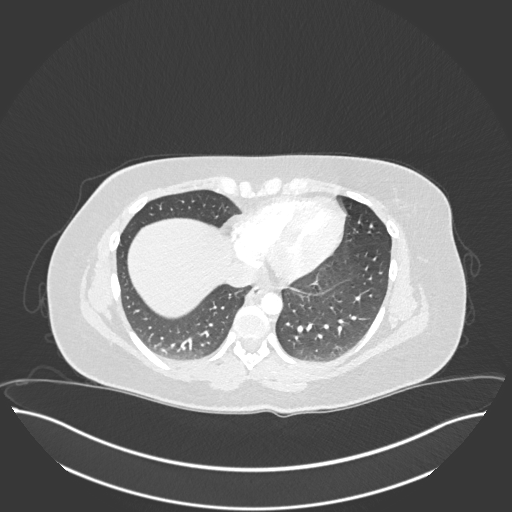
[im 112/285  soft-tissue]
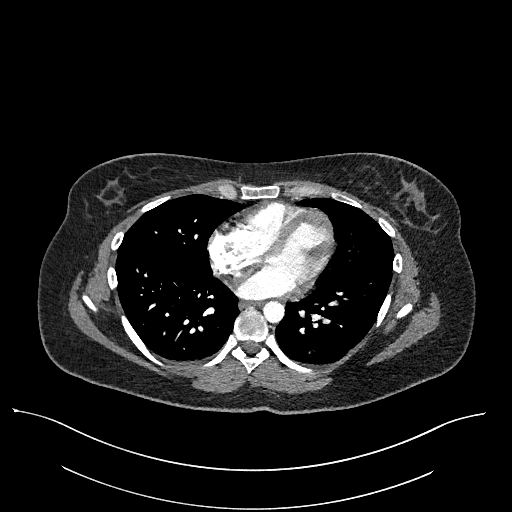
[im 124/285  lung]
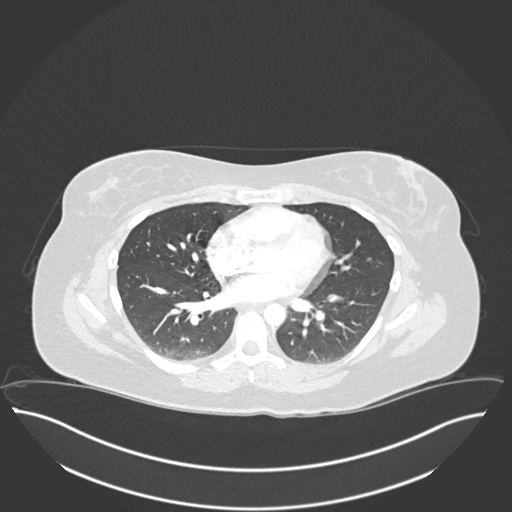
[im 149/285  soft-tissue]
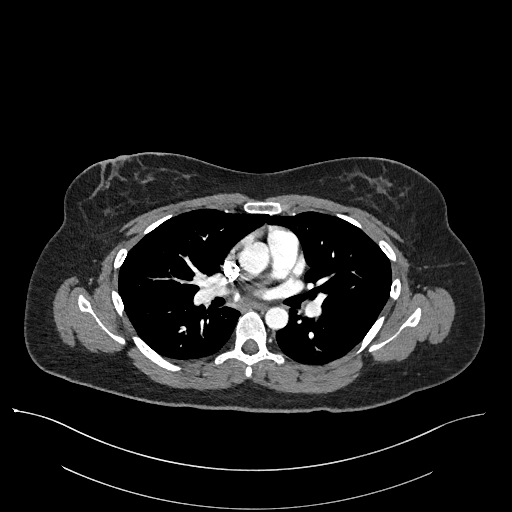
[im 161/285  lung]
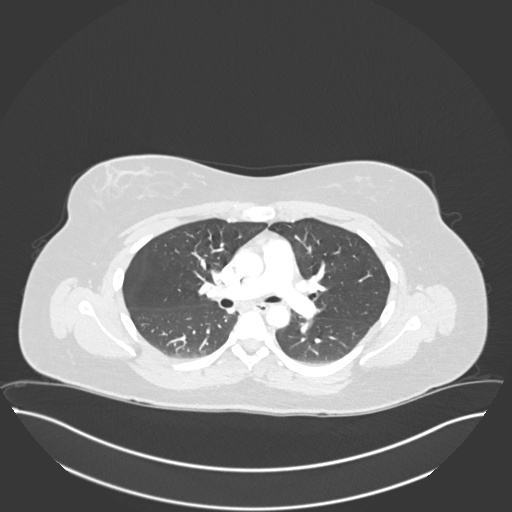
[im 173/285  soft-tissue]
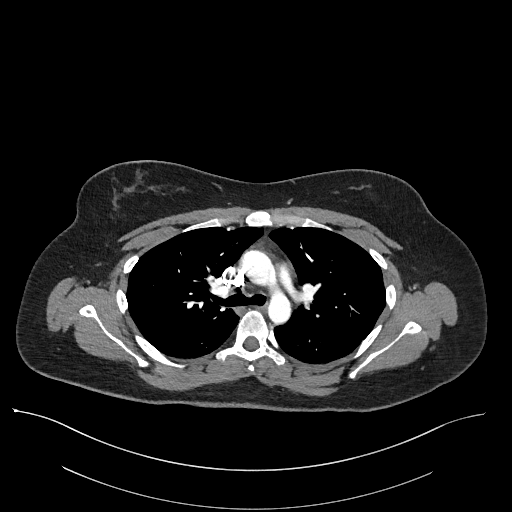
[im 198/285  lung]
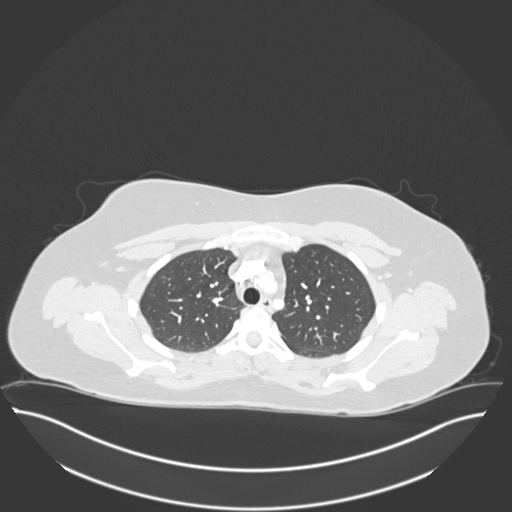
[im 210/285  soft-tissue]
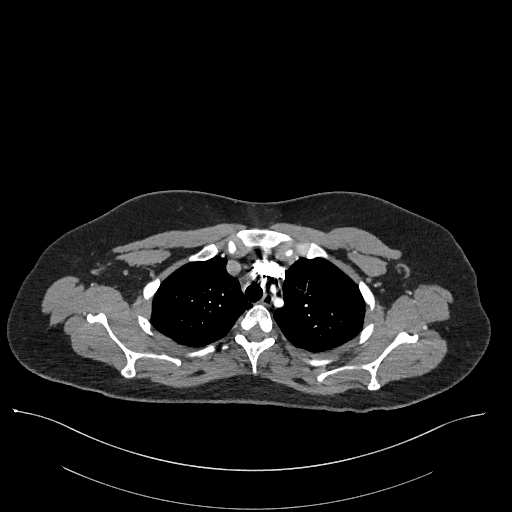
[im 235/285  lung]
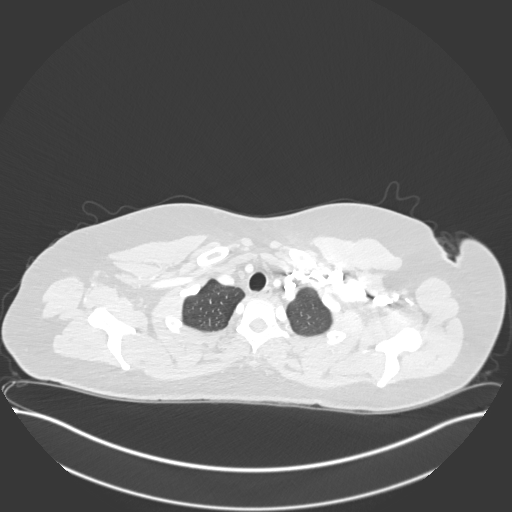
[im 247/285  soft-tissue]
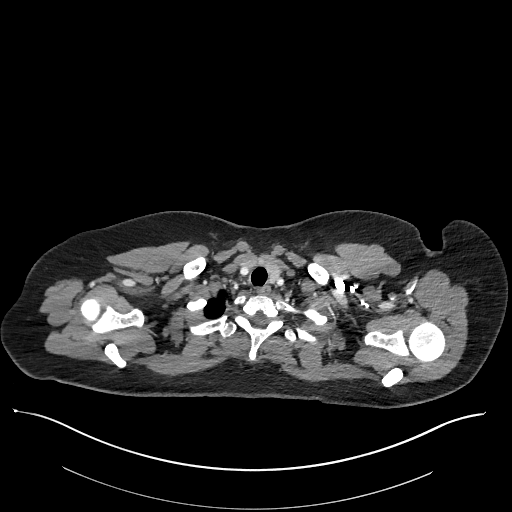
[im 272/285  lung]
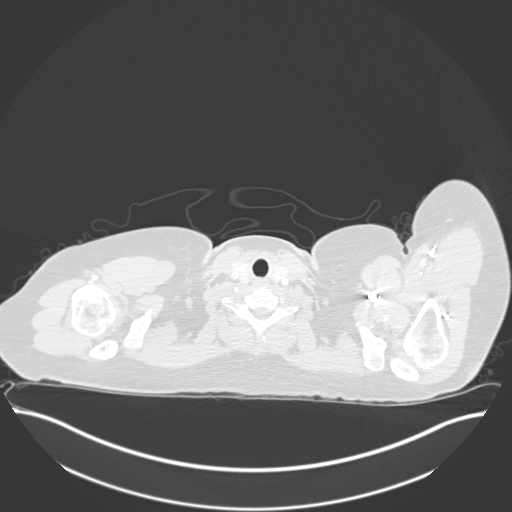

[Series 7: cor soft · coronal · 0.59mm/px · 3 of 137 slices shown]
[im 35/137  soft-tissue]
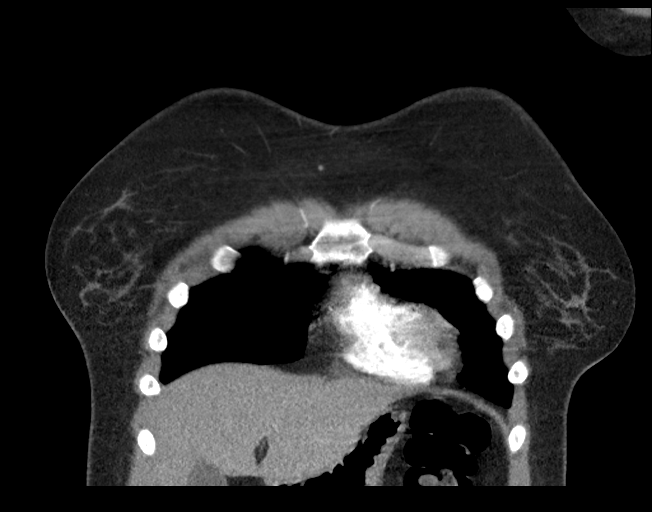
[im 69/137  soft-tissue]
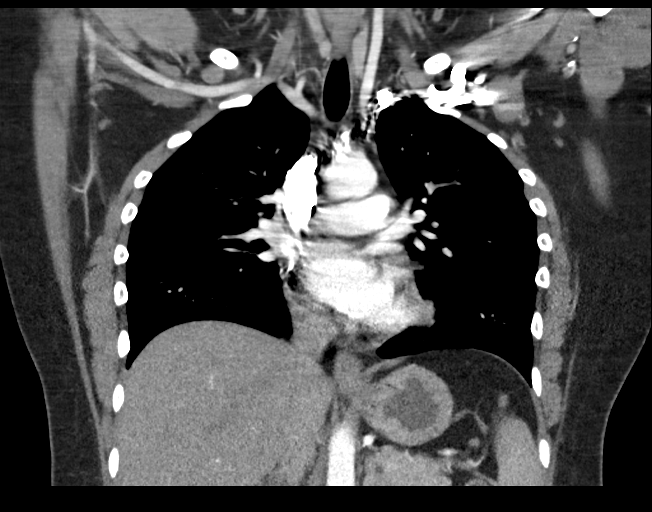
[im 103/137  soft-tissue]
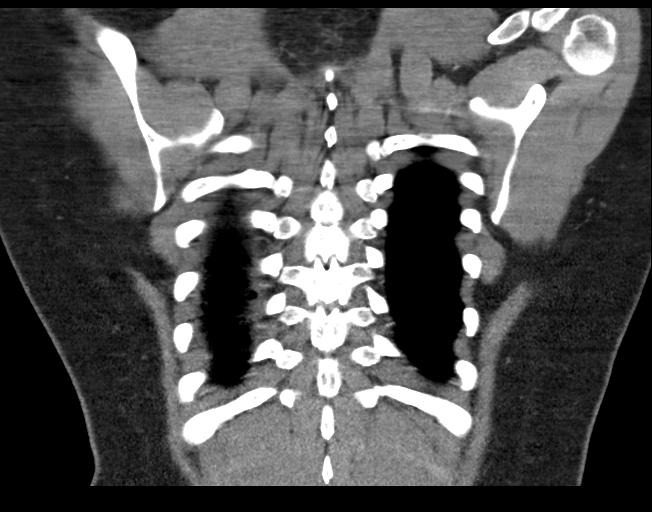

[18 of 46 positions shown; findings below may reference images not displayed]

FINDINGS: Cardiovascular: Evaluation was limited by respiratory motion
artifact. There is no pulmonary embolus or evidence of right heart
strain. The size of the main pulmonary artery is normal. Heart size
is normal, with no pericardial effusion. The course and caliber of
the aorta are normal. There is no atherosclerotic calcification.
Opacification decreased due to pulmonary arterial phase contrast
bolus timing.

Mediastinum/Nodes:

-- No mediastinal lymphadenopathy.

-- No hilar lymphadenopathy.

-- No axillary lymphadenopathy.

-- No supraclavicular lymphadenopathy.

-- Normal thyroid gland where visualized.

-  Unremarkable esophagus.

Lungs/Pleura: There is a trace right-sided pleural effusion. The
lungs are otherwise clear. There is no focal infiltrate or
pneumothorax.

Upper Abdomen: Contrast bolus timing is not optimized for evaluation
of the abdominal organs. The visualized portions of the organs of
the upper abdomen are normal.

Musculoskeletal: No chest wall abnormality. No bony spinal canal
stenosis.

Review of the MIP images confirms the above findings.
IMPRESSION: No pulmonary embolism.

Trace right-sided pleural effusion.

## 2022-08-26 IMAGING — DX DG FINGER THUMB 2+V*R*
3 series · 3 of 3 positions shown · non-contrast
Comparison: None

CLINICAL DATA: Pain and swelling after injury yesterday

EXAM:
RIGHT THUMB 2+V

[thumb mlo]
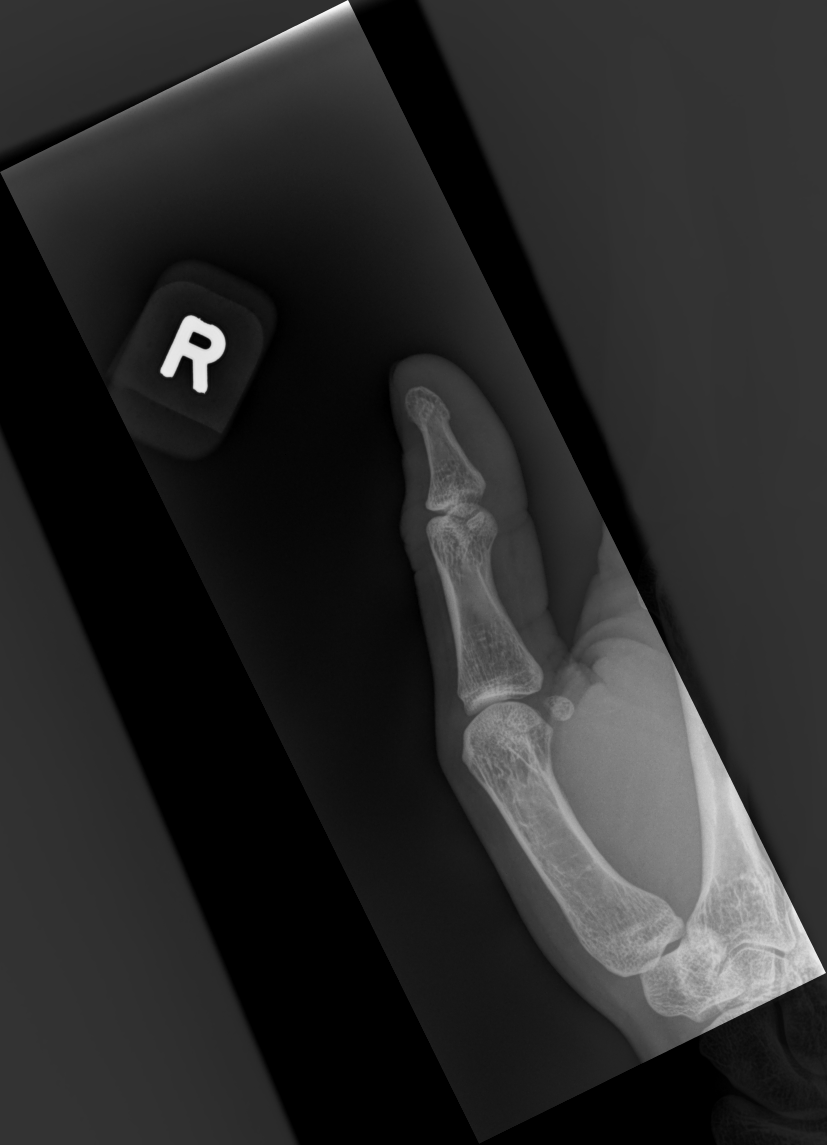

[thumb lat]
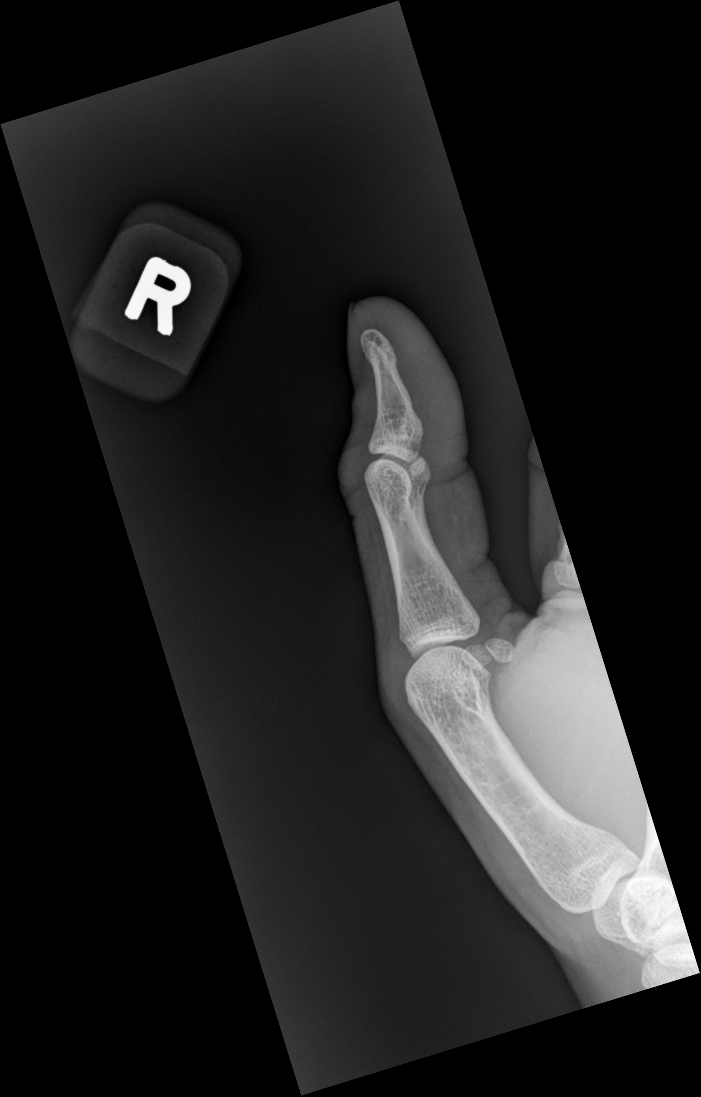

[thumb ap]
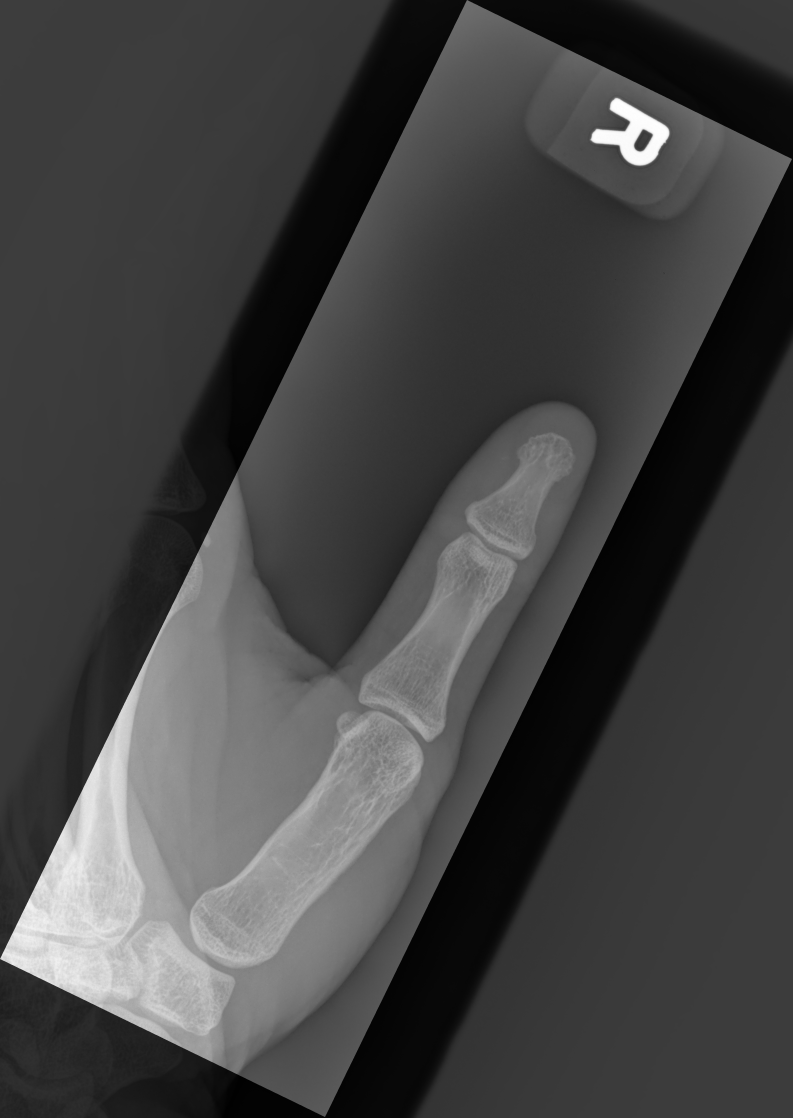

[3 of 3 positions shown; findings below may reference images not displayed]

FINDINGS: Osseous alignment is normal. No acute appearing fracture line or
displaced fracture fragment is identified. No degenerative change.
Soft tissues about the RIGHT thumb are unremarkable.
IMPRESSION: Negative.

## 2022-11-16 ENCOUNTER — Other Ambulatory Visit: Payer: Self-pay | Admitting: Family Medicine

## 2022-11-16 ENCOUNTER — Ambulatory Visit: Payer: Self-pay

## 2022-11-16 DIAGNOSIS — Z0000 Encounter for general adult medical examination without abnormal findings: Secondary | ICD-10-CM

## 2022-12-24 ENCOUNTER — Other Ambulatory Visit: Payer: Self-pay

## 2022-12-24 ENCOUNTER — Ambulatory Visit
Admission: EM | Admit: 2022-12-24 | Discharge: 2022-12-24 | Disposition: A | Discharge status: Home / Self Care | Payer: 59

## 2022-12-24 DIAGNOSIS — I959 Hypotension, unspecified: Secondary | ICD-10-CM

## 2022-12-24 DIAGNOSIS — R42 Dizziness and giddiness: Secondary | ICD-10-CM

## 2022-12-24 LAB — POCT FASTING CBG KUC MANUAL ENTRY: POCT Glucose (KUC): 116 mg/dL — AB (ref 70–99)

## 2022-12-24 NOTE — Discharge Instructions (Addendum)
The EKG was normal, and your blood glucose was 116.  After review of your vital signs with positional changes, his attention. Make sure you are drinking at least 8-10 8 ounce glasses of water daily. Make sure you are eating a healthy diet that includes 3 well-balanced meals per day with healthy snacks in between. Make sure you are getting at least 7 to 8 hours of sleep at night. If you experience recurrence of symptoms or new symptoms of blurred vision, chest pain, shortness of breath, difficulty breathing, or other concerns, would like you to go to the emergency department for further evaluation. I do recommend that you follow-up with your primary care physician for reevaluation or for an annual visit for physical. Follow-up as needed.

## 2022-12-24 NOTE — ED Triage Notes (Signed)
Pt c/o low BP pt states earlier today she was feeling dizzy and lightheaded and was sent home from work pt states her BP Dropped, she was seeing spots

## 2022-12-24 NOTE — ED Provider Notes (Signed)
[facility]    CSN: [id number] Arrival date & time: 12/24/22  1246      History   Chief Complaint No chief complaint on file.   HPI Melanie Price is a 32 y.o. female.   The history is provided by the patient.   Patient presents with complaints of lightheadedness and dizziness while she was at work this morning.  Patient states that initially she thought it was her blood sugar, states that she ate something, but states symptoms did not improve after she ate.  She states after she ate, she began working again, but when she went to get up, she started seeing "spots".  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  Patient states that she has not been drinking fluids today as she went to work and started immediately.  Patient denies fever, chills, headache, blurry vision, chest pain, shortness of breath, difficulty breathing, abdominal pain, nausea, vomiting, or diarrhea.  Patient states that her job is with for her to return to work.  Past Medical History:  Diagnosis Date   Anemia    Bleeding in early pregnancy 12/19/2015   Contraceptive management 09/26/2015   Depression    post-partum depression   Encounter for Nexplanon removal 09/26/2015   Fatty liver 05/15/2018   Refer to GI    Headache    migraine   Miscarriage 12/21/2015   Preeclampsia    Preeclampsia     Patient Active Problem List   Diagnosis Date Noted   Fatty liver 05/15/2018   Kidney stones 05/09/2018   Elevated liver enzymes 05/09/2018   RUQ pain 05/09/2018   Nexplanon insertion 12/10/2017   Preeclampsia in postpartum period 10/23/2017   Severe preeclampsia 10/18/2017   Preeclampsia, severe, third trimester 10/18/2017    Past Surgical History:  Procedure Laterality Date   NO PAST SURGERIES      OB History     Gravida  3   Para  2   Term  1   Preterm  1   AB  1   Living  2      SAB  1   IAB      Ectopic      Multiple  0    Live Births  2            Home Medications    Prior to Admission medications   Medication Sig Start Date End Date Taking? Authorizing Provider  venlafaxine XR (EFFEXOR-XR) 150 MG 24 hr capsule Take 150 mg by mouth daily. 11/29/22  Yes [provider]  carbamide peroxide (DEBROX) 6.5 % OTIC solution Place 5 drops into both ears 2 (two) times daily. 02/26/22   Particia Nearing, PA-C  cephALEXin (KEFLEX) 500 MG capsule Take 1 capsule (500 mg total) by mouth 2 (two) times daily. 05/23/21   Mardella Layman, MD  Phenazopyridine HCl (AZO TABS PO) Take by mouth.    [provider]    Family History Family History  Problem Relation Age of Onset   Cancer Maternal Grandfather        skin   Pyloric stenosis Son    Irritable bowel syndrome Paternal Aunt    Crohn's disease Paternal Aunt    Heart disease Maternal Aunt    Heart disease Maternal Uncle    Hepatitis Maternal Uncle        viral    Social History Social History   Tobacco Use  Smoking status: Never   Smokeless tobacco: Never  Vaping Use   Vaping Use: Never used  Substance Use Topics   Alcohol use: No   Drug use: No     Allergies   Vancomycin   Review of Systems Review of Systems Per HPI  Physical Exam Triage Vital Signs ED Triage Vitals  Enc Vitals Group     BP 12/24/22 1254 129/77     Pulse Rate 12/24/22 1254 76     Resp 12/24/22 1254 15     Temp 12/24/22 1254 98 F (36.7 C)     Temp Source 12/24/22 1254 Oral     SpO2 12/24/22 1254 100 %     Weight --      Height --      Head Circumference --      Peak Flow --      Pain Score 12/24/22 1257 0     Pain Loc --      Pain Edu? --      Excl. in GC? --    Orthostatic VS for the past 24 hrs:  BP- Lying Pulse- Lying BP- Sitting Pulse- Sitting BP- Standing at 0 minutes Pulse- Standing at 0 minutes  12/24/22 1316 111/71 72 133/77 73 114/76 90    Updated Vital Signs BP 129/77 (BP Location: Right Arm)   Pulse 76   Temp 98 F (36.7  C) (Oral)   Resp 15   LMP 12/16/2022 (Exact Date)   SpO2 100%   Visual Acuity Right Eye Distance:   Left Eye Distance:   Bilateral Distance:    Right Eye Near:   Left Eye Near:    Bilateral Near:     Physical Exam Vitals and nursing note reviewed.  Constitutional:      General: She is not in acute distress.    Appearance: Normal appearance.  HENT:     Head: Normocephalic.     Right Ear: Tympanic membrane, ear canal and external ear normal.     Left Ear: Tympanic membrane, ear canal and external ear normal.     Nose: Nose normal.     Mouth/Throat:     Mouth: Mucous membranes are moist.  Eyes:     Extraocular Movements: Extraocular movements intact.     Pupils: Pupils are equal, round, and reactive to light.  Cardiovascular:     Rate and Rhythm: Normal rate and regular rhythm.     Pulses: Normal pulses.     Heart sounds: Normal heart sounds.  Pulmonary:     Effort: Pulmonary effort is normal. No respiratory distress.     Breath sounds: Normal breath sounds. No stridor. No wheezing, rhonchi or rales.  Abdominal:     General: Bowel sounds are normal.     Palpations: Abdomen is soft.     Tenderness: There is no abdominal tenderness.  Musculoskeletal:     Cervical back: Normal range of motion.  Lymphadenopathy:     Cervical: No cervical adenopathy.  Skin:    General: Skin is warm and dry.  Neurological:     General: No focal deficit present.     Mental Status: She is alert and oriented to person, place, and time.     GCS: GCS eye subscore is 4. GCS verbal subscore is 5. GCS motor subscore is 6.     Cranial Nerves: Cranial nerves 2-12 are intact.     Sensory: Sensation is intact.     Motor: Motor function is intact.  Coordination: Coordination is intact.     Gait: Gait is intact.  Psychiatric:        Mood and Affect: Mood normal.        Behavior: Behavior normal.      UC Treatments / Results  Labs (all labs ordered are listed, but only abnormal results are  displayed) Labs Reviewed  POCT FASTING [facility] MANUAL ENTRY - Abnormal; Notable for the following components:      Result Value   POCT Glucose ([facility]) 116 (*)    All other components within normal limits    EKG: NSR w/o ectopy, compared to previous EKG dated 03/11/2020.   Radiology No results found.  Procedures Procedures (including critical care time)  Medications Ordered in UC Medications - No data to display  Initial Impression / Assessment and Plan / UC Course  I have reviewed the triage vital signs and the nursing notes.  Pertinent labs & imaging results that were available during my care of the patient were reviewed by me and considered in my medical decision making (see chart for details).  The patient is well-appearing, she is in no acute distress, vital signs are stable.  EKG shows normal sinus rhythm without ectopy, blood glucose is 116, and patient was negative for orthostatic hypotension.  Difficult to ascertain the cause of the patient's symptoms at this point.  Patient was advised to make sure she is drinking at least 8-10 8 ounce glasses of water, getting plenty of rest, diet.  Did suggest patient following up with her primary care physician for a reevaluation, or if symptoms recur.  Patient was given strict ER follow-up precautions to include fainting, visual changes, weakness, chest pain, shortness of breath.  Patient is in agreement with this plan of care and verbalizes understanding.  All questions were answered.  Patient stable for discharge.  Work note was provided.   Final Clinical Impressions(s) / UC Diagnoses   Final diagnoses:  Lightheadedness  Dizziness  Hypotension, unspecified hypotension type     Discharge Instructions      The EKG was normal, and your blood glucose was 116.  After review of your vital signs with positional changes, his attention. Make sure you are drinking at least 8-10 8 ounce glasses of water daily. Make sure you are eating a  healthy diet that includes 3 well-balanced meals per day with healthy snacks in between. Make sure you are getting at least 7 to 8 hours of sleep at night. If you experience recurrence of symptoms or new symptoms of blurred vision, chest pain, shortness of breath, difficulty breathing, or other concerns, would like you to go to the emergency department for further evaluation. I do recommend that you follow-up with your primary care physician for reevaluation or for an annual visit for physical. Follow-up as needed.     ED Prescriptions   None    PDMP not reviewed this encounter.   Abran Cantor, NP 12/24/22 1338

## 2023-06-21 ENCOUNTER — Ambulatory Visit
Admission: EM | Admit: 2023-06-21 | Discharge: 2023-06-21 | Disposition: A | Discharge status: Home / Self Care | Payer: Medicaid Other | Attending: Family Medicine | Admitting: Family Medicine

## 2023-06-21 DIAGNOSIS — J029 Acute pharyngitis, unspecified: Secondary | ICD-10-CM | POA: Diagnosis present

## 2023-06-21 LAB — POCT RAPID STREP A (OFFICE): Rapid Strep A Screen: NEGATIVE

## 2023-06-21 MED ORDER — LIDOCAINE VISCOUS HCL 2 % MT SOLN: 10.0000 mL | OROMUCOSAL | 0 refills | Status: DC | PRN

## 2023-06-21 NOTE — ED Triage Notes (Signed)
Pt reports she has a sore throat x 2 day

## 2023-06-21 NOTE — ED Provider Notes (Signed)
[facility]    CSN: [id number] Arrival date & time: 06/21/23  4098      History   Chief Complaint Chief Complaint  Patient presents with   Sore Throat    HPI Melanie Price is a 32 y.o. female.   Patient presenting today with sore throat for 2 days.  Denies fever, chills, congestion, cough, chest pain, shortness of breath, abdominal pain, nausea vomiting or diarrhea.  So far trying over-the-counter remedies with minimal relief.  No known sick contacts recently.    Past Medical History:  Diagnosis Date   Anemia    Bleeding in early pregnancy 12/19/2015   Contraceptive management 09/26/2015   Depression    post-partum depression   Encounter for Nexplanon removal 09/26/2015   Fatty liver 05/15/2018   Refer to GI    Headache    migraine   Miscarriage 12/21/2015   Preeclampsia    Preeclampsia     Patient Active Problem List   Diagnosis Date Noted   Fatty liver 05/15/2018   Kidney stones 05/09/2018   Elevated liver enzymes 05/09/2018   RUQ pain 05/09/2018   Nexplanon insertion 12/10/2017   Preeclampsia in postpartum period 10/23/2017   Severe preeclampsia 10/18/2017   Preeclampsia, severe, third trimester 10/18/2017    Past Surgical History:  Procedure Laterality Date   NO PAST SURGERIES      OB History     Gravida  3   Para  2   Term  1   Preterm  1   AB  1   Living  2      SAB  1   IAB      Ectopic      Multiple  0   Live Births  2            Home Medications    Prior to Admission medications   Medication Sig Start Date End Date Taking? Authorizing Provider  lidocaine (XYLOCAINE) 2 % solution Use as directed 10 mLs in the mouth or throat every 3 (three) hours as needed. 06/21/23  Yes Particia Nearing, PA-C  carbamide peroxide (DEBROX) 6.5 % OTIC solution Place 5 drops into both ears 2 (two) times daily. 02/26/22   Particia Nearing, PA-C  cephALEXin (KEFLEX) 500 MG capsule Take 1 capsule (500 mg total) by  mouth 2 (two) times daily. 05/23/21   Mardella Layman, MD  Phenazopyridine HCl (AZO TABS PO) Take by mouth.    [provider]  venlafaxine XR (EFFEXOR-XR) 150 MG 24 hr capsule Take 150 mg by mouth daily. 11/29/22   [provider]    Family History Family History  Problem Relation Age of Onset   Cancer Maternal Grandfather        skin   Pyloric stenosis Son    Irritable bowel syndrome Paternal Aunt    Crohn's disease Paternal Aunt    Heart disease Maternal Aunt    Heart disease Maternal Uncle    Hepatitis Maternal Uncle        viral    Social History Social History   Tobacco Use   Smoking status: Never   Smokeless tobacco: Never  Vaping Use   Vaping status: Never Used  Substance Use Topics   Alcohol use: No   Drug use: No     Allergies   Vancomycin   Review of Systems Review of Systems Per HPI  Physical Exam Triage Vital Signs ED Triage Vitals [06/21/23 0946]  Encounter Vitals Group  BP (!) 104/55     Systolic BP Percentile      Diastolic BP Percentile      Pulse Rate 78     Resp 20     Temp 98.3 F (36.8 C)     Temp Source Oral     SpO2 97 %     Weight      Height      Head Circumference      Peak Flow      Pain Score 5     Pain Loc      Pain Education      Exclude from Growth Chart    No data found.  Updated Vital Signs BP (!) 104/55 (BP Location: Right Arm)   Pulse 78   Temp 98.3 F (36.8 C) (Oral)   Resp 20   LMP 06/20/2023 Comment: end date  SpO2 97%   Visual Acuity Right Eye Distance:   Left Eye Distance:   Bilateral Distance:    Right Eye Near:   Left Eye Near:    Bilateral Near:     Physical Exam Vitals and nursing note reviewed.  Constitutional:      Appearance: Normal appearance. She is not ill-appearing.  HENT:     Head: Atraumatic.     Nose: Nose normal.     Mouth/Throat:     Mouth: Mucous membranes are moist.     Pharynx: Oropharynx is clear. Posterior oropharyngeal erythema present. No  oropharyngeal exudate.  Eyes:     Extraocular Movements: Extraocular movements intact.     Conjunctiva/sclera: Conjunctivae normal.  Cardiovascular:     Rate and Rhythm: Normal rate and regular rhythm.     Heart sounds: Normal heart sounds.  Pulmonary:     Effort: Pulmonary effort is normal.     Breath sounds: Normal breath sounds.  Musculoskeletal:        General: Normal range of motion.     Cervical back: Normal range of motion and neck supple.  Lymphadenopathy:     Cervical: No cervical adenopathy.  Skin:    General: Skin is warm and dry.  Neurological:     Mental Status: She is alert and oriented to person, place, and time.  Psychiatric:        Mood and Affect: Mood normal.        Thought Content: Thought content normal.        Judgment: Judgment normal.      UC Treatments / Results  Labs (all labs ordered are listed, but only abnormal results are displayed) Labs Reviewed  CULTURE, GROUP A STREP [facility])  POCT RAPID STREP A (OFFICE)    EKG   Radiology No results found.  Procedures Procedures (including critical care time)  Medications Ordered in UC Medications - No data to display  Initial Impression / Assessment and Plan / UC Course  I have reviewed the triage vital signs and the nursing notes.  Pertinent labs & imaging results that were available during my care of the patient were reviewed by me and considered in my medical decision making (see chart for details).     Vitals and exam reassuring, rapid strep negative, throat culture pending.  Discussed viscous lidocaine, supportive over-the-counter medications and home care.  Return for worsening symptoms.  Final Clinical Impressions(s) / UC Diagnoses   Final diagnoses:  Sore throat     Discharge Instructions      Your strep test was negative today.  I have sent out for  a throat culture to further evaluate.  Someone will call if this comes back positive to adjust your medication.  I have sent over  a numbing liquid for you to gargle to help with your pain in the meantime and you may take over-the-counter lozenges, throat sprays, ibuprofen and Tylenol.    ED Prescriptions     Medication Sig Dispense Auth. Provider   lidocaine (XYLOCAINE) 2 % solution Use as directed 10 mLs in the mouth or throat every 3 (three) hours as needed. 100 mL Particia Nearing, [place]      PDMP not reviewed this encounter.   Particia Nearing, [place] 06/21/23 1424

## 2023-06-21 NOTE — Discharge Instructions (Signed)
Your strep test was negative today.  I have sent out for a throat culture to further evaluate.  Someone will call if this comes back positive to adjust your medication.  I have sent over a numbing liquid for you to gargle to help with your pain in the meantime and you may take over-the-counter lozenges, throat sprays, ibuprofen and Tylenol.

## 2023-06-24 LAB — CULTURE, GROUP A STREP (THRC)

## 2023-09-17 ENCOUNTER — Ambulatory Visit
Admission: EM | Admit: 2023-09-17 | Discharge: 2023-09-17 | Disposition: A | Discharge status: Home / Self Care | Payer: Medicaid Other | Attending: Nurse Practitioner | Admitting: Nurse Practitioner

## 2023-09-17 DIAGNOSIS — M79602 Pain in left arm: Secondary | ICD-10-CM

## 2023-09-17 DIAGNOSIS — M67431 Ganglion, right wrist: Secondary | ICD-10-CM | POA: Diagnosis not present

## 2023-09-17 MED ORDER — PREDNISONE 20 MG PO TABS: 40.0000 mg | ORAL_TABLET | Freq: Every day | ORAL | 0 refills | Status: AC

## 2023-09-17 NOTE — ED Triage Notes (Signed)
Pt reports she has been having pain in her left arm  that shoots from her wrist up to her forearm x2 days   Swollen area in right wrist x 6 months

## 2023-09-17 NOTE — ED Provider Notes (Signed)
[facility]    CSN: [id number] Arrival date & time: 09/17/23  6578      History   Chief Complaint Chief Complaint  Patient presents with   arm problems    HPI Melanie Price is a 33 y.o. female.   The history is provided by the patient.   Patient presents for complaints of pain to the right wrist and to the left upper extremity.  Patient states pain in the right wrist is been present for the past 6 months, symptoms have worsened over the past several days.  She states that she has noticed a "knot" in the wrist when she bends her hand down.  She also states that the "knot" has gotten larger.  Denies injury, trauma, numbness, tingling, or bruising of the right wrist.  States that she has had increased work duties over the past several days.  She also reports pain to the left upper extremity that is been present for the past 2 days.  Patient states that pain starts around the left forearm and radiates to the back of the left upper arm.  She states that the areas are tender.  She states that she has not noticed any swelling, redness, or bruising, states that she has pain that is consistent with a "dull ache."  Rates pain 4/10 at present.  States that her job has caused her to do heavier lifting, and increased repetitive motion.  States that she does not plan to [employer].  States that she has been taking over-the-counter NSAIDs with minimal relief.  Past Medical History:  Diagnosis Date   Anemia    Bleeding in early pregnancy 12/19/2015   Contraceptive management 09/26/2015   Depression    post-partum depression   Encounter for Nexplanon removal 09/26/2015   Fatty liver 05/15/2018   Refer to GI    Headache    migraine   Miscarriage 12/21/2015   Preeclampsia    Preeclampsia     Patient Active Problem List   Diagnosis Date Noted   Fatty liver 05/15/2018   Kidney stones 05/09/2018   Elevated liver enzymes 05/09/2018   RUQ pain 05/09/2018   Nexplanon  insertion 12/10/2017   Preeclampsia in postpartum period 10/23/2017   Severe preeclampsia 10/18/2017   Preeclampsia, severe, third trimester 10/18/2017    Past Surgical History:  Procedure Laterality Date   NO PAST SURGERIES      OB History     Gravida  3   Para  2   Term  1   Preterm  1   AB  1   Living  2      SAB  1   IAB      Ectopic      Multiple  0   Live Births  2            Home Medications    Prior to Admission medications   Medication Sig Start Date End Date Taking? Authorizing Provider  carbamide peroxide (DEBROX) 6.5 % OTIC solution Place 5 drops into both ears 2 (two) times daily. 02/26/22   Particia Nearing, PA-C  cephALEXin (KEFLEX) 500 MG capsule Take 1 capsule (500 mg total) by mouth 2 (two) times daily. 05/23/21   Mardella Layman, MD  lidocaine (XYLOCAINE) 2 % solution Use as directed 10 mLs in the mouth or throat every 3 (three) hours as needed. 06/21/23   Particia Nearing, PA-C  Phenazopyridine HCl (AZO TABS PO) Take by mouth.  [provider]  venlafaxine XR (EFFEXOR-XR) 150 MG 24 hr capsule Take 150 mg by mouth daily. 11/29/22   [provider]    Family History Family History  Problem Relation Age of Onset   Cancer Maternal Grandfather        skin   Pyloric stenosis Son    Irritable bowel syndrome Paternal Aunt    Crohn's disease Paternal Aunt    Heart disease Maternal Aunt    Heart disease Maternal Uncle    Hepatitis Maternal Uncle        viral    Social History Social History   Tobacco Use   Smoking status: Never   Smokeless tobacco: Never  Vaping Use   Vaping status: Never Used  Substance Use Topics   Alcohol use: No   Drug use: No     Allergies   Vancomycin   Review of Systems Review of Systems Per HPI  Physical Exam Triage Vital Signs ED Triage Vitals  Encounter Vitals Group     BP 09/17/23 1018 128/73     Systolic BP Percentile --      Diastolic BP Percentile --       Pulse Rate 09/17/23 1018 85     Resp 09/17/23 1018 20     Temp 09/17/23 1018 98 F (36.7 C)     Temp Source 09/17/23 1018 Oral     SpO2 09/17/23 1018 97 %     Weight --      Height --      Head Circumference --      Peak Flow --      Pain Score 09/17/23 1020 6     Pain Loc --      Pain Education --      Exclude from Growth Chart --    No data found.  Updated Vital Signs BP 128/73 (BP Location: Right Arm)   Pulse 85   Temp 98 F (36.7 C) (Oral)   Resp 20   LMP 08/21/2023   SpO2 97%   Visual Acuity Right Eye Distance:   Left Eye Distance:   Bilateral Distance:    Right Eye Near:   Left Eye Near:    Bilateral Near:     Physical Exam Vitals and nursing note reviewed.  Constitutional:      General: She is not in acute distress.    Appearance: Normal appearance.  HENT:     Head: Normocephalic.  Eyes:     Extraocular Movements: Extraocular movements intact.     Pupils: Pupils are equal, round, and reactive to light.  Musculoskeletal:     Left upper arm: Tenderness (Posterior left upper arm) present. No swelling or edema.     Left elbow: Normal.     Left forearm: Tenderness present. No swelling, edema or deformity.     Right wrist: Swelling (ganglion cyst) present. No deformity or effusion. Decreased range of motion (d/t pain). Normal pulse.     Left wrist: No swelling, deformity, tenderness or crepitus. Normal range of motion. Normal pulse.     Comments: There is no bruising, redness, swelling, or obvious deformity noted to the left upper extremity.  Skin:    General: Skin is warm and dry.  Neurological:     General: No focal deficit present.     Mental Status: She is alert and oriented to person, place, and time.  Psychiatric:        Mood and Affect: Mood normal.  Behavior: Behavior normal.      UC Treatments / Results  Labs (all labs ordered are listed, but only abnormal results are displayed) Labs Reviewed - No data to  display  EKG   Radiology No results found.  Procedures Procedures (including critical care time)  Medications Ordered in UC Medications - No data to display  Initial Impression / Assessment and Plan / UC Course  I have reviewed the triage vital signs and the nursing notes.  Pertinent labs & imaging results that were available during my care of the patient were reviewed by me and considered in my medical decision making (see chart for details).  Symptoms consistent with a ganglion cyst in the right wrist based on exam.  With regard to her left upper extremity pain, will order x-ray.  Difficult to determine true etiology of left arm pain.  Suspect inflammatory cause of pain based on patient's current presentation.  Will start prednisone 40 mg for the next 5 days.  With regard to the cyst on the right wrist, will have patient follow-up with orthopedics for further evaluation and management.  Supportive care recommendations were provided and discussed with the patient to include over-the-counter analgesics, rest, ice, and elevation of the left upper arm.  Patient was in agreement with this plan of care and verbalized understanding.  All questions were answered.  Patient stable for discharge.  Work note was provided.  Final Clinical Impressions(s) / UC Diagnoses   Final diagnoses:  None   Discharge Instructions   None    ED Prescriptions   None    PDMP not reviewed this encounter.   Abran Cantor, NP 09/17/23 1119

## 2023-09-17 NOTE — Discharge Instructions (Addendum)
Go to [facility] for the x-ray of your left forearm.  You will need to go to the main entrance of the hospital, then go to the radiology department. Take medication as prescribed. May take over-the-counter Tylenol while taking the prednisone.  Recommend Arthritis Strength or Extra Strength Tylenol as directed. RICE therapy, rest, ice, compression, and elevation.  Apply ice for 20 minutes, remove for 1 hour, repeat as needed to help with pain.  Elevate the left upper extremity above the level of the heart to help with pain. If symptoms fail to improve with this treatment, recommend following up with orthopedics for further evaluation.  It is recommended that you follow-up with orthopedics regarding the swelling in your right wrist. Follow-up as needed.

## 2023-10-17 ENCOUNTER — Emergency Department (HOSPITAL_COMMUNITY): Admission: EM | Admit: 2023-10-17 | Discharge: 2023-10-18 | Disposition: A | Discharge status: Home / Self Care

## 2023-10-17 ENCOUNTER — Other Ambulatory Visit: Payer: Self-pay

## 2023-10-17 ENCOUNTER — Encounter (HOSPITAL_COMMUNITY): Payer: Self-pay

## 2023-10-17 ENCOUNTER — Emergency Department (HOSPITAL_COMMUNITY)

## 2023-10-17 DIAGNOSIS — O208 Other hemorrhage in early pregnancy: Secondary | ICD-10-CM | POA: Insufficient documentation

## 2023-10-17 DIAGNOSIS — Z3A08 8 weeks gestation of pregnancy: Secondary | ICD-10-CM | POA: Insufficient documentation

## 2023-10-17 DIAGNOSIS — Z79899 Other long term (current) drug therapy: Secondary | ICD-10-CM | POA: Diagnosis not present

## 2023-10-17 LAB — HEPATIC FUNCTION PANEL
ALT: 25 U/L (ref 0–44)
AST: 44 U/L — ABNORMAL HIGH (ref 15–41)
Albumin: 3.8 g/dL (ref 3.5–5.0)
Alkaline Phosphatase: 97 U/L (ref 38–126)
Bilirubin, Direct: <0.1 mg/dL (ref 0.0–0.2)
Total Bilirubin: 0.2 mg/dL (ref 0.0–1.2)
Total Protein: 7.3 g/dL (ref 6.5–8.1)

## 2023-10-17 LAB — BASIC METABOLIC PANEL
Anion gap: 10 (ref 5–15)
BUN: 12 mg/dL (ref 6–20)
CO2: 23 mmol/L (ref 22–32)
Calcium: 9.2 mg/dL (ref 8.9–10.3)
Chloride: 100 mmol/L (ref 98–111)
Creatinine, Ser: 0.65 mg/dL (ref 0.44–1.00)
GFR, Estimated: >60 mL/min (ref >60)
Glucose, Bld: 83 mg/dL (ref 70–99)
Potassium: 3.7 mmol/L (ref 3.5–5.1)
Sodium: 133 mmol/L — ABNORMAL LOW (ref 135–145)

## 2023-10-17 LAB — CBC
HCT: 35.6 % — ABNORMAL LOW (ref 36.0–46.0)
Hemoglobin: 11.1 g/dL — ABNORMAL LOW (ref 12.0–15.0)
MCH: 25.6 pg — ABNORMAL LOW (ref 26.0–34.0)
MCHC: 31.2 g/dL (ref 30.0–36.0)
MCV: 82.2 fL (ref 80.0–100.0)
Platelets: 409 10*3/uL — ABNORMAL HIGH (ref 150–400)
RBC: 4.33 MIL/uL (ref 3.87–5.11)
RDW: 15.6 % — ABNORMAL HIGH (ref 11.5–15.5)
WBC: 12.5 10*3/uL — ABNORMAL HIGH (ref 4.0–10.5)
nRBC: 0 % (ref 0.0–0.2)

## 2023-10-17 LAB — HCG, QUANTITATIVE, PREGNANCY: hCG, Beta Chain, Quant, S: 71515 m[IU]/mL — ABNORMAL HIGH (ref <5)

## 2023-10-17 LAB — HCG, SERUM, QUALITATIVE: Preg, Serum: POSITIVE — AB

## 2023-10-17 NOTE — ED Provider Notes (Signed)
  EMERGENCY DEPARTMENT AT [facility] Provider Note   CSN: [id number] Arrival date & time: 10/17/23  1714     History {Add pertinent medical, surgical, social history, OB history to HPI:1} Chief Complaint  Patient presents with   Vaginal Bleeding    Melanie Price is a 33 y.o. female.  The history is provided by the patient. No language interpreter was used.  Vaginal Bleeding      Home Medications Prior to Admission medications   Medication Sig Start Date End Date Taking? Authorizing Provider  carbamide peroxide (DEBROX) 6.5 % OTIC solution Place 5 drops into both ears 2 (two) times daily. 02/26/22   Particia Nearing, PA-C  cephALEXin (KEFLEX) 500 MG capsule Take 1 capsule (500 mg total) by mouth 2 (two) times daily. 05/23/21   Mardella Layman, MD  lidocaine (XYLOCAINE) 2 % solution Use as directed 10 mLs in the mouth or throat every 3 (three) hours as needed. 06/21/23   Particia Nearing, PA-C  Phenazopyridine HCl (AZO TABS PO) Take by mouth.    [provider]  venlafaxine XR (EFFEXOR-XR) 150 MG 24 hr capsule Take 150 mg by mouth daily. 11/29/22   [provider]      Allergies    Vancomycin    Review of Systems   Review of Systems  Genitourinary:  Positive for vaginal bleeding.    Physical Exam Updated Vital Signs BP 116/69 (BP Location: Right Arm)   Pulse 98   Temp 98.4 F (36.9 C) (Oral)   Resp 18   Ht 5\' 8"  (1.727 m)   Wt 81.3 kg   LMP 08/21/2023   SpO2 100%   BMI 27.25 kg/m  Physical Exam  ED Results / Procedures / Treatments   Labs (all labs ordered are listed, but only abnormal results are displayed) Labs Reviewed  CBC - Abnormal; Notable for the following components:      Result Value   WBC 12.5 (*)    Hemoglobin 11.1 (*)    HCT 35.6 (*)    MCH 25.6 (*)    RDW 15.6 (*)    Platelets 409 (*)    All other components within normal limits  BASIC METABOLIC PANEL - Abnormal; Notable for the following  components:   Sodium 133 (*)    All other components within normal limits  HCG, SERUM, QUALITATIVE - Abnormal; Notable for the following components:   Preg, Serum POSITIVE (*)    All other components within normal limits  HCG, QUANTITATIVE, PREGNANCY - Abnormal; Notable for the following components:   hCG, Beta Chain, Quant, S 71,515 (*)    All other components within normal limits  HEPATIC FUNCTION PANEL - Abnormal; Notable for the following components:   AST 44 (*)    All other components within normal limits  URINALYSIS, ROUTINE W REFLEX MICROSCOPIC    EKG None  Radiology No results found.  Procedures Procedures  {Document cardiac monitor, telemetry assessment procedure when appropriate:1}  Medications Ordered in ED Medications - No data to display  ED Course/ Medical Decision Making/ A&P   {   Click here for ABCD2, HEART and other calculatorsREFRESH Note before signing :1}                              Medical Decision Making Amount and/or Complexity of Data Reviewed Labs: ordered. Radiology: ordered.   ***  {Document critical care time when appropriate:1} {Document  review of labs and clinical decision tools ie heart score, Chads2Vasc2 etc:1}  {Document your independent review of radiology images, and any outside records:1} {Document your discussion with family members, caretakers, and with consultants:1} {Document social determinants of health affecting pt's care:1} {Document your decision making why or why not admission, treatments were needed:1} Final Clinical Impression(s) / ED Diagnoses Final diagnoses:  None    Rx / DC Orders ED Discharge Orders     None

## 2023-10-17 NOTE — ED Triage Notes (Signed)
 Pt arrived via POV c/o sharp lower abdominal pain for a couple of days. Pt reports being [redacted]wks pregnant. Pt endorsing vaginal bleeding. Pt reports in 2017 she had a miscarriage.

## 2023-10-18 NOTE — Discharge Instructions (Signed)
 As we discussed, your workup in the ER today was reassuring for acute findings.  You have a healthy 8-week gestation pregnancy.  You have a small subchorionic hemorrhage which can be normal at this stage in your pregnancy.  If your bleeding is minimal then it is not of any concern.  Please call your OB/GYN to schedule close follow-up appointment.  Return if development of any significant pain or bleeding or any new or worsening symptoms

## 2023-10-21 ENCOUNTER — Encounter: Admitting: Adult Health

## 2023-10-21 ENCOUNTER — Other Ambulatory Visit: Payer: Self-pay

## 2023-10-21 ENCOUNTER — Encounter (HOSPITAL_COMMUNITY): Payer: Self-pay | Admitting: Obstetrics and Gynecology

## 2023-10-21 ENCOUNTER — Inpatient Hospital Stay (HOSPITAL_COMMUNITY)
Admission: AD | Admit: 2023-10-21 | Discharge: 2023-10-21 | Disposition: A | Discharge status: Home / Self Care | Attending: Obstetrics and Gynecology | Admitting: Obstetrics and Gynecology

## 2023-10-21 DIAGNOSIS — O98811 Other maternal infectious and parasitic diseases complicating pregnancy, first trimester: Secondary | ICD-10-CM | POA: Insufficient documentation

## 2023-10-21 DIAGNOSIS — B379 Candidiasis, unspecified: Secondary | ICD-10-CM | POA: Insufficient documentation

## 2023-10-21 DIAGNOSIS — Z3A01 Less than 8 weeks gestation of pregnancy: Secondary | ICD-10-CM | POA: Diagnosis not present

## 2023-10-21 DIAGNOSIS — Z3A08 8 weeks gestation of pregnancy: Secondary | ICD-10-CM

## 2023-10-21 DIAGNOSIS — O208 Other hemorrhage in early pregnancy: Secondary | ICD-10-CM | POA: Diagnosis not present

## 2023-10-21 DIAGNOSIS — R109 Unspecified abdominal pain: Secondary | ICD-10-CM | POA: Insufficient documentation

## 2023-10-21 DIAGNOSIS — O26891 Other specified pregnancy related conditions, first trimester: Secondary | ICD-10-CM | POA: Diagnosis not present

## 2023-10-21 DIAGNOSIS — R102 Pelvic and perineal pain: Secondary | ICD-10-CM | POA: Diagnosis present

## 2023-10-21 LAB — URINALYSIS, ROUTINE W REFLEX MICROSCOPIC
Bacteria, UA: NONE SEEN
Bilirubin Urine: NEGATIVE
Glucose, UA: NEGATIVE mg/dL
Hgb urine dipstick: NEGATIVE
Ketones, ur: NEGATIVE mg/dL
Nitrite: NEGATIVE
Protein, ur: NEGATIVE mg/dL
Specific Gravity, Urine: 1.023 (ref 1.005–1.030)
pH: 6 (ref 5.0–8.0)

## 2023-10-21 LAB — WET PREP, GENITAL
Clue Cells Wet Prep HPF POC: NONE SEEN
Sperm: NONE SEEN
Trich, Wet Prep: NONE SEEN
WBC, Wet Prep HPF POC: <10 (ref <10)

## 2023-10-21 MED ORDER — TERCONAZOLE 0.4 % VA CREA: 1.0000 | TOPICAL_CREAM | Freq: Every day | VAGINAL | 0 refills | Status: DC

## 2023-10-21 NOTE — MAU Provider Note (Signed)
 History     CSN: [id number]  Arrival date and time: 10/21/23 1311   Event Date/Time   First Provider Initiated Contact with Patient 10/21/23 1519      Chief Complaint  Patient presents with   Abdominal Pain   Vaginal Bleeding   Melanie Price , a  33 y.o. [id number] at [redacted]w[redacted]d presents to MAU with complaints of ongoing abdominal pain and vaginal spotting.  Patient was recently seen at any.  Hospital for similar complaint.  Reports today pelvic pain and discomfort no worse than before but ongoing.  Currently reports pain is sharp and intermittent.  Patient finds comfort and relief with p.o. Tylenol.  Last dose at 11 AM this morning.  Currently rates pain 5 out of 10.  She also reports continued vaginal spotting.  She denies bright red heavy vaginal bleeding or passing.  Also denies abnormal vaginal discharge and urinary symptoms.  Patient reports a previous miscarriage and is very concerned.     Patient recently seen at [facility] and noted to have a single living IUP measuring approximately [redacted] weeks gestation.  She was also noted to have a subchorionic hematoma on ultrasound 5 days ago.    OB History     Gravida  4   Para  2   Term  1   Preterm  1   AB  1   Living  2      SAB  1   IAB      Ectopic      Multiple  0   Live Births  2           Past Medical History:  Diagnosis Date   Anemia    Bleeding in early pregnancy 12/19/2015   Contraceptive management 09/26/2015   Depression    post-partum depression   Encounter for Nexplanon removal 09/26/2015   Fatty liver 05/15/2018   Refer to GI    Headache    migraine   Miscarriage 12/21/2015   Preeclampsia    Preeclampsia     Past Surgical History:  Procedure Laterality Date   NO PAST SURGERIES      Family History  Problem Relation Age of Onset   Cancer Maternal Grandfather        skin   Pyloric stenosis Son    Irritable bowel syndrome Paternal Aunt    Crohn's disease Paternal Aunt    Heart  disease Maternal Aunt    Heart disease Maternal Uncle    Hepatitis Maternal Uncle        viral    Social History   Tobacco Use   Smoking status: Never   Smokeless tobacco: Never  Vaping Use   Vaping status: Never Used  Substance Use Topics   Alcohol use: No   Drug use: No    Allergies:  Allergies  Allergen Reactions   Vancomycin Itching and Dermatitis    Scalp itching, neck and ear erythema.    Medications Prior to Admission  Medication Sig Dispense Refill Last Dose/Taking   carbamide peroxide (DEBROX) 6.5 % OTIC solution Place 5 drops into both ears 2 (two) times daily. 15 mL 0    cephALEXin (KEFLEX) 500 MG capsule Take 1 capsule (500 mg total) by mouth 2 (two) times daily. 10 capsule 0    lidocaine (XYLOCAINE) 2 % solution Use as directed 10 mLs in the mouth or throat every 3 (three) hours as needed. 100 mL 0    Phenazopyridine HCl (AZO TABS PO)  Take by mouth.      venlafaxine XR (EFFEXOR-XR) 150 MG 24 hr capsule Take 150 mg by mouth daily.       Review of Systems  Constitutional:  Negative for chills, fatigue and fever.  Eyes:  Negative for pain and visual disturbance.  Respiratory:  Negative for apnea, shortness of breath and wheezing.   Cardiovascular:  Negative for chest pain and palpitations.  Gastrointestinal:  Positive for abdominal pain. Negative for constipation, diarrhea, nausea and vomiting.  Genitourinary:  Positive for pelvic pain. Negative for difficulty urinating, dysuria, vaginal bleeding, vaginal discharge and vaginal pain.  Musculoskeletal:  Negative for back pain.  Neurological:  Negative for seizures, weakness and headaches.  Psychiatric/Behavioral:  Negative for suicidal ideas.    Physical Exam   Blood pressure (!) 109/44, pulse 76, temperature 98.4 F (36.9 C), temperature source Oral, resp. rate 18, height 5\' 9"  (1.753 m), weight 96 kg, last menstrual period 08/21/2023, SpO2 100%.  Physical Exam Vitals and nursing note reviewed.   Constitutional:      General: She is not in acute distress.    Appearance: Normal appearance.  HENT:     Head: Normocephalic.  Pulmonary:     Effort: Pulmonary effort is normal.  Musculoskeletal:     Cervical back: Normal range of motion.  Skin:    General: Skin is warm and dry.  Neurological:     Mental Status: She is alert and oriented to person, place, and time.  Psychiatric:        Mood and Affect: Mood normal.     MAU Course  Procedures Orders Placed This Encounter  Procedures   Wet prep, genital   Urinalysis, Routine w reflex microscopic -Urine, Clean Catch   Discharge patient Discharge disposition: 01-Home or Self Care; Discharge patient date: 10/21/2023   Results for orders placed or performed during the hospital encounter of 10/21/23 (from the past 24 hours)  Urinalysis, Routine w reflex microscopic -Urine, Clean Catch     Status: Abnormal   Collection Time: 10/21/23  2:14 PM  Result Value Ref Range   Color, Urine YELLOW YELLOW   APPearance CLEAR CLEAR   Specific Gravity, Urine 1.023 1.005 - 1.030   pH 6.0 5.0 - 8.0   Glucose, UA NEGATIVE NEGATIVE mg/dL   Hgb urine dipstick NEGATIVE NEGATIVE   Bilirubin Urine NEGATIVE NEGATIVE   Ketones, ur NEGATIVE NEGATIVE mg/dL   Protein, ur NEGATIVE NEGATIVE mg/dL   Nitrite NEGATIVE NEGATIVE   Leukocytes,Ua TRACE (A) NEGATIVE   RBC / HPF 0-5 0 - 5 RBC/hpf   WBC, UA 0-5 0 - 5 WBC/hpf   Bacteria, UA NONE SEEN NONE SEEN   Squamous Epithelial / HPF 0-5 0 - 5 /HPF   Mucus PRESENT   Wet prep, genital     Status: Abnormal   Collection Time: 10/21/23  2:57 PM  Result Value Ref Range   Yeast Wet Prep HPF POC PRESENT (A) NONE SEEN   Trich, Wet Prep NONE SEEN NONE SEEN   Clue Cells Wet Prep HPF POC NONE SEEN NONE SEEN   WBC, Wet Prep HPF POC <10 <10   Sperm NONE SEEN     MDM -CNM reviewed previous ultrasound imaging and testing done at outside facility. -Given no worsening signs or symptoms, wet prep and GC collected.  UA  also ordered to rule out UTI and pregnancy -UA normal.  Low suspicion for dehydration. -Wet prep positive for yeast. -GC pending upon discharge. -Pain possibly related to early  pregnancy versus round ligament pain given multiple parity.  -Plan for discharge.   Assessment and Plan   1. Yeast infection   2. [redacted] weeks gestation of pregnancy   3. Subchorionic hemorrhage of placenta in first trimester   4. Abdominal pain, unspecified abdominal location    -Reviewed that certain vaginal infections can cause abdominal discomfort pelvic discomfort and vaginal spotting in early pregnancy. -Reviewed worsening signs and return precautions. -Reviewed bleeding expectations with the subchorionic hematoma and pregnancy -Rx for Terazol sent to outpatient pharmacy. -Reviewed over-the-counter comfort measures for muscle skeletal pain. -[facility]  Encouraged to keep appointment. -Patient discharged home in stable condition and may return to MAU as needed.  Claudette Head, MSN CNM  10/21/2023, 3:20 PM

## 2023-10-21 NOTE — MAU Note (Signed)
 Melanie Price is a 33 y.o. at Unknown here in MAU reporting: she's having abdominal cramping and light VB for the past 5 days.   Also reports intermittent sharp abdominal pain in center of abdomen.  LMP: 08/21/2023 Onset of complaint: 5 days Pain score: 5 Vitals:   10/21/23 1407  BP: (!) 109/44  Pulse: 76  Resp: 18  Temp: 98.4 F (36.9 C)  SpO2: 100%     FHT: NA  Lab orders placed from triage: UA

## 2023-10-22 LAB — GC/CHLAMYDIA PROBE AMP (~~LOC~~) NOT AT ARMC
Chlamydia: NEGATIVE
Comment: NEGATIVE
Comment: NORMAL
Neisseria Gonorrhea: NEGATIVE

## 2023-11-11 ENCOUNTER — Encounter (HOSPITAL_COMMUNITY): Payer: Self-pay

## 2023-11-11 ENCOUNTER — Emergency Department (HOSPITAL_COMMUNITY)
Admission: EM | Admit: 2023-11-11 | Discharge: 2023-11-11 | Discharge status: Against Medical Advice | Attending: Emergency Medicine | Admitting: Emergency Medicine

## 2023-11-11 ENCOUNTER — Other Ambulatory Visit: Payer: Self-pay

## 2023-11-11 DIAGNOSIS — O219 Vomiting of pregnancy, unspecified: Secondary | ICD-10-CM | POA: Insufficient documentation

## 2023-11-11 DIAGNOSIS — O209 Hemorrhage in early pregnancy, unspecified: Secondary | ICD-10-CM | POA: Diagnosis present

## 2023-11-11 DIAGNOSIS — Z3A12 12 weeks gestation of pregnancy: Secondary | ICD-10-CM | POA: Insufficient documentation

## 2023-11-11 DIAGNOSIS — Z5321 Procedure and treatment not carried out due to patient leaving prior to being seen by health care provider: Secondary | ICD-10-CM | POA: Insufficient documentation

## 2023-11-11 NOTE — ED Triage Notes (Signed)
 Pt to ED from urgent care, for N/V, lower abd pain and vaginal bleeding, currently [redacted] weeks pregnant, was seen here on 10/17/23 for the same.

## 2023-11-11 NOTE — ED Notes (Signed)
 Pt called 3 times with no answer

## 2023-11-13 ENCOUNTER — Other Ambulatory Visit: Payer: Self-pay | Admitting: Obstetrics & Gynecology

## 2023-11-13 ENCOUNTER — Encounter: Payer: Self-pay | Admitting: Women's Health

## 2023-11-13 DIAGNOSIS — Z3480 Encounter for supervision of other normal pregnancy, unspecified trimester: Secondary | ICD-10-CM | POA: Insufficient documentation

## 2023-11-13 DIAGNOSIS — Z3682 Encounter for antenatal screening for nuchal translucency: Secondary | ICD-10-CM

## 2023-11-13 DIAGNOSIS — Z8759 Personal history of other complications of pregnancy, childbirth and the puerperium: Secondary | ICD-10-CM | POA: Insufficient documentation

## 2023-11-14 ENCOUNTER — Other Ambulatory Visit (HOSPITAL_COMMUNITY)
Admission: RE | Admit: 2023-11-14 | Discharge: 2023-11-14 | Disposition: A | Discharge status: Home / Self Care | Source: Ambulatory Visit | Attending: Women's Health | Admitting: Women's Health

## 2023-11-14 ENCOUNTER — Ambulatory Visit (INDEPENDENT_AMBULATORY_CARE_PROVIDER_SITE_OTHER): Admitting: Women's Health

## 2023-11-14 ENCOUNTER — Ambulatory Visit: Admitting: *Deleted

## 2023-11-14 ENCOUNTER — Ambulatory Visit

## 2023-11-14 ENCOUNTER — Encounter: Payer: Self-pay | Admitting: Women's Health

## 2023-11-14 VITALS — BP 112/70 | HR 83 | Wt 216.0 lb

## 2023-11-14 DIAGNOSIS — Z124 Encounter for screening for malignant neoplasm of cervix: Secondary | ICD-10-CM | POA: Insufficient documentation

## 2023-11-14 DIAGNOSIS — Z131 Encounter for screening for diabetes mellitus: Secondary | ICD-10-CM

## 2023-11-14 DIAGNOSIS — Z3682 Encounter for antenatal screening for nuchal translucency: Secondary | ICD-10-CM | POA: Diagnosis not present

## 2023-11-14 DIAGNOSIS — Z3480 Encounter for supervision of other normal pregnancy, unspecified trimester: Secondary | ICD-10-CM | POA: Insufficient documentation

## 2023-11-14 DIAGNOSIS — Z113 Encounter for screening for infections with a predominantly sexual mode of transmission: Secondary | ICD-10-CM | POA: Insufficient documentation

## 2023-11-14 DIAGNOSIS — Z1332 Encounter for screening for maternal depression: Secondary | ICD-10-CM

## 2023-11-14 DIAGNOSIS — Z3A12 12 weeks gestation of pregnancy: Secondary | ICD-10-CM

## 2023-11-14 DIAGNOSIS — Z8759 Personal history of other complications of pregnancy, childbirth and the puerperium: Secondary | ICD-10-CM | POA: Diagnosis not present

## 2023-11-14 DIAGNOSIS — Z3481 Encounter for supervision of other normal pregnancy, first trimester: Secondary | ICD-10-CM

## 2023-11-14 DIAGNOSIS — F418 Other specified anxiety disorders: Secondary | ICD-10-CM | POA: Insufficient documentation

## 2023-11-14 MED ORDER — DOXYLAMINE-PYRIDOXINE 10-10 MG PO TBEC: DELAYED_RELEASE_TABLET | ORAL | 6 refills | Status: DC

## 2023-11-14 MED ORDER — PRENATAL PLUS 27-1 MG PO TABS: ORAL_TABLET | ORAL | 3 refills | Status: AC

## 2023-11-14 MED ORDER — ASPIRIN 81 MG PO TBEC: 162.0000 mg | DELAYED_RELEASE_TABLET | Freq: Every day | ORAL | 2 refills | Status: AC

## 2023-11-14 MED ORDER — BLOOD PRESSURE MONITOR MISC: 0 refills | Status: AC

## 2023-11-14 NOTE — Progress Notes (Signed)
 [place] 12+1 wks,measurements c/w dates,normal ovaries,NT 1.3 mm,NB present,FHR 157 bpm,posterior placenta

## 2023-11-14 NOTE — Progress Notes (Signed)
 INITIAL OBSTETRICAL VISIT Patient name: Melanie Price MRN [id number]  Date of birth: 01/05/1991 Chief Complaint:   Initial Prenatal Visit (nausea)  History of Present Illness:   Melanie Price is a 33 y.o. [id number] Caucasian female at [redacted]w[redacted]d by LMP c/w u/s at 8 weeks with an Estimated Date of Delivery: 05/27/24 being seen today for her initial obstetrical visit.   Patient's last menstrual period was 08/21/2023. Her obstetrical history is significant for  term uncomplicated SVB x 1, SAB x 1, 35wk IOL for severe pre-e then readmit for pp pre-e .   Today she reports  nausea, some vomiting-requests meds . Not taking pnv.   Dep/anx- on effexor, doing well, ok w/ [facility] Last pap 2018. Results were: NILM w/ HRHPV not done     11/14/2023   10:49 AM 04/16/2017    9:52 AM 03/05/2017   10:05 AM 11/20/2016   10:34 AM  Depression screen PHQ 2/9  Decreased Interest 1 2 0 3  Down, Depressed, Hopeless 1 1 0 2  PHQ - 2 Score 2 3 0 5  Altered sleeping 1 2  3   Tired, decreased energy 1 3  3   Change in appetite 1 3  3   Feeling bad or failure about yourself  1 1  3   Trouble concentrating 1 1  2   Moving slowly or fidgety/restless 1 0  1  Suicidal thoughts 0 0  1  PHQ-9 Score 8 13  21   Difficult doing work/chores  Extremely dIfficult  Very difficult        11/14/2023   10:49 AM  GAD 7 : Generalized Anxiety Score  Nervous, Anxious, on Edge 1  Control/stop worrying 1  Worry too much - different things 1  Trouble relaxing 1  Restless 1  Easily annoyed or irritable 1  Afraid - awful might happen 1  Total GAD 7 Score 7     Review of Systems:   Pertinent items are noted in HPI Denies cramping/contractions, leakage of fluid, vaginal bleeding, abnormal vaginal discharge w/ itching/odor/irritation, headaches, visual changes, shortness of breath, chest pain, abdominal pain, severe nausea/vomiting, or problems with urination or bowel movements unless otherwise stated above.  Pertinent History Reviewed:   Reviewed past medical,surgical, social, obstetrical and family history.  Reviewed problem list, medications and allergies. OB History  Gravida Para Term Preterm AB Living  4 2 1 1 1 2   SAB IAB Ectopic Multiple Live Births  1   0 2    # Outcome Date GA Lbr Len/2nd Weight Sex Type Anes PTL Lv  4 Current           3 Preterm 10/19/17 107w6d  5 lb 5 oz (2.41 kg) F Vag-Spont EPI  LIV     Complications: Severe pre-eclampsia  2 SAB 11/2015 [redacted]w[redacted]d         1 Term 06/17/14 [redacted]w[redacted]d / 01:36 7 lb 2.6 oz (3.25 kg) M Vag-Spont EPI N LIV     Birth Comments: none   Physical Assessment:   Vitals:   11/14/23 1055  BP: 112/70  Pulse: 83  Weight: 216 lb (98 kg)  Body mass index is 31.9 kg/m.       Physical Examination:  General appearance - well appearing, and in no distress  Mental status - alert, oriented to person, place, and time  Psych:  She has a normal mood and affect  Skin - warm and dry, normal color, no suspicious lesions noted  Chest - effort normal,  all lung fields clear to auscultation bilaterally  Heart - normal rate and regular rhythm  Abdomen - soft, nontender  Extremities:  No swelling or varicosities noted  Pelvic - VULVA: normal appearing vulva with no masses, tenderness or lesions  VAGINA: normal appearing vagina with normal color and discharge, no lesions  CERVIX: normal appearing cervix without discharge or lesions, no CMT  Thin prep pap is done w/ reflex HR HPV cotesting  Chaperone: Peggy Dones  TODAY'S NT [place] 12+1 wks,measurements c/w dates,normal ovaries,NT 1.3 mm,NB present,FHR 157 bpm,posterior placenta   No results found for this or any previous visit (from the past 24 hours).  Assessment & Plan:  1) Low-Risk Pregnancy [id number] at [redacted]w[redacted]d with an Estimated Date of Delivery: 05/27/24   2) Initial OB visit  3) N/V> rx diclegis  4) Dep/anx> doing well on effexor, IBH referral ordered  5) H/O severe pre-e w/ 35wk IOL and pp readmit> ASA, baseline labs  Meds:  Meds  ordered this encounter  Medications   Blood Pressure Monitor MISC    Sig: For regular home bp monitoring during pregnancy    Dispense:  1 each    Refill:  0    Z34.81 Please mail to patient   aspirin EC 81 MG tablet    Sig: Take 2 tablets (162 mg total) by mouth daily. Swallow whole.    Dispense:  180 tablet    Refill:  2   Doxylamine-Pyridoxine (DICLEGIS) 10-10 MG TBEC    Sig: 2 tabs q hs, if sx persist add 1 tab q am on day 3, if sx persist add 1 tab q afternoon on day 4    Dispense:  100 tablet    Refill:  6   prenatal vitamin w/FE, FA (PRENATAL 1 + 1) 27-1 MG TABS tablet    Sig: Take 1 tablet by mouth daily    Dispense:  90 tablet    Refill:  3    Initial labs obtained Continue prenatal vitamins Reviewed n/v relief measures and warning s/s to report Reviewed recommended weight gain based on pre-gravid BMI Encouraged well-balanced diet Genetic & carrier screening discussed: requests Panorama, NT/IT, and Horizon  Ultrasound discussed; fetal survey: requested CCNC completed> form faxed if has or is planning to apply for medicaid The nature of [employer] for [employer] with multiple MDs and other Advanced Practice Providers was explained to patient; also emphasized that fellows, residents, and students are part of our team. Does not have home bp cuff. Office bp cuff given: no. Rx sent: yes. Check bp weekly, let us know if consistently >140/90.   Follow-up: Return in about 4 weeks (around 12/12/2023) for LROB, 2nd IT, CNM, in person; then 7wks from now anatomy u/s and LROB w/ CNM.   Orders Placed This Encounter  Procedures   Urine Culture   Integrated 1   Protein / creatinine ratio, urine   PANORAMA PRENATAL TEST   Hemoglobin A1c   CBC/D/Plt+RPR+Rh+ABO+RubIgG...   Comprehensive metabolic panel with GFR   HORIZON CUSTOM   Amb ref to [facility]    Cheral Marker CNM, [facility] 11/14/2023 12:00 PM

## 2023-11-14 NOTE — Patient Instructions (Signed)
 Melanie Price, thank you for choosing our office today! We appreciate the opportunity to meet your healthcare needs. You may receive a short survey by mail, e-mail, or through [employer]. If you are happy with your care we would appreciate if you could take just a few minutes to complete the survey questions. We read all of your comments and take your feedback very seriously. Thank you again for choosing our office.  [facility] for [employer] [facility] Team at [facility]  [facility] at [facility] ([address] [place], [place] [address]) Entrance C, located off of [address] Free 24/7 valet parking   Nausea & Vomiting Have saltine crackers or pretzels by your bed and eat a few bites before you raise your head out of bed in the morning Eat small frequent meals throughout the day instead of large meals Drink plenty of fluids throughout the day to stay hydrated, just don't drink a lot of fluids with your meals.  This can make your stomach fill up faster making you feel sick Do not brush your teeth right after you eat Products with real ginger are good for nausea, like ginger ale and ginger hard candy Make sure it says made with real ginger! Sucking on sour candy like lemon heads is also good for nausea If your prenatal vitamins make you nauseated, take them at night so you will sleep through the nausea Sea Bands If you feel like you need medicine for the nausea & vomiting please let us know If you are unable to keep any fluids or food down please let us know   Constipation Drink plenty of fluid, preferably water, throughout the day Eat foods high in fiber such as fruits, vegetables, and grains Exercise, such as walking, is a good way to keep your bowels regular Drink warm fluids, especially warm prune juice, or decaf coffee Eat a 1/2 cup of real oatmeal (not instant), 1/2 cup applesauce, and 1/2-1 cup warm prune juice every day If needed, you may take Colace (docusate sodium) stool softener  once or twice a day to help keep the stool soft.  If you still are having problems with constipation, you may take Miralax once daily as needed to help keep your bowels regular.   Home Blood Pressure Monitoring for Patients   Your provider has recommended that you check your blood pressure (BP) at least once a week at home. If you do not have a blood pressure cuff at home, one will be provided for you. Contact your provider if you have not received your monitor within 1 week.   Helpful Tips for Accurate Home Blood Pressure Checks  Don't smoke, exercise, or drink caffeine 30 minutes before checking your BP Use the restroom before checking your BP (a full bladder can raise your pressure) Relax in a comfortable upright chair Feet on the ground Left arm resting comfortably on a flat surface at the level of your heart Legs uncrossed Back supported Sit quietly and don't talk Place the cuff on your bare arm Adjust snuggly, so that only two fingertips can fit between your skin and the top of the cuff Check 2 readings separated by at least one minute Keep a log of your BP readings For a visual, please reference this diagram: http://ccnc.care/bpdiagram  Provider Name: [facility]     Phone: [phone]  Zone 1: ALL CLEAR  Continue to monitor your symptoms:  BP reading is less than 140 (top number) or less than 90 (bottom  number)  No right upper stomach pain No headaches or seeing spots No feeling nauseated or throwing up No swelling in face and hands  Zone 2: CAUTION Call your doctor's office for any of the following:  BP reading is greater than 140 (top number) or greater than 90 (bottom number)  Stomach pain under your ribs in the middle or right side Headaches or seeing spots Feeling nauseated or throwing up Swelling in face and hands  Zone 3: EMERGENCY  Seek immediate medical care if you have any of the following:  BP reading is greater than160 (top number) or greater than  110 (bottom number) Severe headaches not improving with Tylenol Serious difficulty catching your breath Any worsening symptoms from Zone 2    First Trimester of Pregnancy The first trimester of pregnancy is from week 1 until the end of week 12 (months 1 through 3). A week after a sperm fertilizes an egg, the egg will implant on the wall of the uterus. This embryo will begin to develop into a baby. Genes from you and your partner are forming the baby. The female genes determine whether the baby is a boy or a girl. At 6-8 weeks, the eyes and face are formed, and the heartbeat can be seen on ultrasound. At the end of 12 weeks, all the baby's organs are formed.  Now that you are pregnant, you will want to do everything you can to have a healthy baby. Two of the most important things are to get good prenatal care and to follow your health care provider's instructions. Prenatal care is all the medical care you receive before the baby's birth. This care will help prevent, find, and treat any problems during the pregnancy and childbirth. BODY CHANGES Your body goes through many changes during pregnancy. The changes vary from woman to woman.  You may gain or lose a couple of pounds at first. You may feel sick to your stomach (nauseous) and throw up (vomit). If the vomiting is uncontrollable, call your health care provider. You may tire easily. You may develop headaches that can be relieved by medicines approved by your health care provider. You may urinate more often. Painful urination may mean you have a bladder infection. You may develop heartburn as a result of your pregnancy. You may develop constipation because certain hormones are causing the muscles that push waste through your intestines to slow down. You may develop hemorrhoids or swollen, bulging veins (varicose veins). Your breasts may begin to grow larger and become tender. Your nipples may stick out more, and the tissue that surrounds them  (areola) may become darker. Your gums may bleed and may be sensitive to brushing and flossing. Dark spots or blotches (chloasma, mask of pregnancy) may develop on your face. This will likely fade after the baby is born. Your menstrual periods will stop. You may have a loss of appetite. You may develop cravings for certain kinds of food. You may have changes in your emotions from day to day, such as being excited to be pregnant or being concerned that something may go wrong with the pregnancy and baby. You may have more vivid and strange dreams. You may have changes in your hair. These can include thickening of your hair, rapid growth, and changes in texture. Some women also have hair loss during or after pregnancy, or hair that feels dry or thin. Your hair will most likely return to normal after your baby is born. WHAT TO EXPECT AT YOUR PRENATAL  VISITS During a routine prenatal visit: You will be weighed to make sure you and the baby are growing normally. Your blood pressure will be taken. Your abdomen will be measured to track your baby's growth. The fetal heartbeat will be listened to starting around week 10 or 12 of your pregnancy. Test results from any previous visits will be discussed. Your health care provider may ask you: How you are feeling. If you are feeling the baby move. If you have had any abnormal symptoms, such as leaking fluid, bleeding, severe headaches, or abdominal cramping. If you have any questions. Other tests that may be performed during your first trimester include: Blood tests to find your blood type and to check for the presence of any previous infections. They will also be used to check for low iron levels (anemia) and Rh antibodies. Later in the pregnancy, blood tests for diabetes will be done along with other tests if problems develop. Urine tests to check for infections, diabetes, or protein in the urine. An ultrasound to confirm the proper growth and development  of the baby. An amniocentesis to check for possible genetic problems. Fetal screens for spina bifida and Down syndrome. You may need other tests to make sure you and the baby are doing well. HOME CARE INSTRUCTIONS  Medicines Follow your health care provider's instructions regarding medicine use. Specific medicines may be either safe or unsafe to take during pregnancy. Take your prenatal vitamins as directed. If you develop constipation, try taking a stool softener if your health care provider approves. Diet Eat regular, well-balanced meals. Choose a variety of foods, such as meat or vegetable-based protein, fish, milk and low-fat dairy products, vegetables, fruits, and whole grain breads and cereals. Your health care provider will help you determine the amount of weight gain that is right for you. Avoid raw meat and uncooked cheese. These carry germs that can cause birth defects in the baby. Eating four or five small meals rather than three large meals a day may help relieve nausea and vomiting. If you start to feel nauseous, eating a few soda crackers can be helpful. Drinking liquids between meals instead of during meals also seems to help nausea and vomiting. If you develop constipation, eat more high-fiber foods, such as fresh vegetables or fruit and whole grains. Drink enough fluids to keep your urine clear or pale yellow. Activity and Exercise Exercise only as directed by your health care provider. Exercising will help you: Control your weight. Stay in shape. Be prepared for labor and delivery. Experiencing pain or cramping in the lower abdomen or low back is a good sign that you should stop exercising. Check with your health care provider before continuing normal exercises. Try to avoid standing for long periods of time. Move your legs often if you must stand in one place for a long time. Avoid heavy lifting. Wear low-heeled shoes, and practice good posture. You may continue to have sex  unless your health care provider directs you otherwise. Relief of Pain or Discomfort Wear a good support bra for breast tenderness.   Take warm sitz baths to soothe any pain or discomfort caused by hemorrhoids. Use hemorrhoid cream if your health care provider approves.   Rest with your legs elevated if you have leg cramps or low back pain. If you develop varicose veins in your legs, wear support hose. Elevate your feet for 15 minutes, 3-4 times a day. Limit salt in your diet. Prenatal Care Schedule your prenatal visits by the  twelfth week of pregnancy. They are usually scheduled monthly at first, then more often in the last 2 months before delivery. Write down your questions. Take them to your prenatal visits. Keep all your prenatal visits as directed by your health care provider. Safety Wear your seat belt at all times when driving. Make a list of emergency phone numbers, including numbers for family, friends, the hospital, and police and fire departments. General Tips Ask your health care provider for a referral to a local prenatal education class. Begin classes no later than at the beginning of month 6 of your pregnancy. Ask for help if you have counseling or nutritional needs during pregnancy. Your health care provider can offer advice or refer you to specialists for help with various needs. Do not use hot tubs, steam rooms, or saunas. Do not douche or use tampons or scented sanitary pads. Do not cross your legs for long periods of time. Avoid cat litter boxes and soil used by cats. These carry germs that can cause birth defects in the baby and possibly loss of the fetus by miscarriage or stillbirth. Avoid all smoking, herbs, alcohol, and medicines not prescribed by your health care provider. Chemicals in these affect the formation and growth of the baby. Schedule a dentist appointment. At home, brush your teeth with a soft toothbrush and be gentle when you floss. SEEK MEDICAL CARE IF:   You have dizziness. You have mild pelvic cramps, pelvic pressure, or nagging pain in the abdominal area. You have persistent nausea, vomiting, or diarrhea. You have a bad smelling vaginal discharge. You have pain with urination. You notice increased swelling in your face, hands, legs, or ankles. SEEK IMMEDIATE MEDICAL CARE IF:  You have a fever. You are leaking fluid from your vagina. You have spotting or bleeding from your vagina. You have severe abdominal cramping or pain. You have rapid weight gain or loss. You vomit blood or material that looks like coffee grounds. You are exposed to [place] measles and have never had them. You are exposed to fifth disease or chickenpox. You develop a severe headache. You have shortness of breath. You have any kind of trauma, such as from a fall or a car accident. Document Released: 07/17/2001 Document Revised: 12/07/2013 Document Reviewed: 06/02/2013 [facility] Patient Information 2015 [place], [place]. This information is not intended to replace advice given to you by your health care provider. Make sure you discuss any questions you have with your health care provider.

## 2023-11-16 LAB — HCV INTERPRETATION

## 2023-11-16 LAB — PROTEIN / CREATININE RATIO, URINE
Creatinine, Urine: 128 mg/dL
Protein, Ur: 9.4 mg/dL
Protein/Creat Ratio: 73 mg/g{creat} (ref 0–200)

## 2023-11-16 LAB — INTEGRATED 1
Crown Rump Length: 58.2 mm
Gest. Age on Collection Date: 12.1 wk
Maternal Age at EDD: 32.3 a
Nuchal Translucency (NT): 1.3 mm
Number of Fetuses: 1
PAPP-A Value: 241.6 ng/mL
Sonographer ID#: 309760
Weight: 210 [lb_av]

## 2023-11-16 LAB — CBC/D/PLT+RPR+RH+ABO+RUBIGG...
Antibody Screen: NEGATIVE
Basophils Absolute: 0 10*3/uL (ref 0.0–0.2)
Basos: 0 %
EOS (ABSOLUTE): 0.1 10*3/uL (ref 0.0–0.4)
Eos: 1 %
HCV Ab: NONREACTIVE
HIV Screen 4th Generation wRfx: NONREACTIVE
Hematocrit: 39.4 % (ref 34.0–46.6)
Hemoglobin: 12.4 g/dL (ref 11.1–15.9)
Hepatitis B Surface Ag: NEGATIVE
Immature Grans (Abs): 0 10*3/uL (ref 0.0–0.1)
Immature Granulocytes: 0 %
Lymphocytes Absolute: 1.6 10*3/uL (ref 0.7–3.1)
Lymphs: 17 %
MCH: 25.5 pg — ABNORMAL LOW (ref 26.6–33.0)
MCHC: 31.5 g/dL (ref 31.5–35.7)
MCV: 81 fL (ref 79–97)
Monocytes Absolute: 0.6 10*3/uL (ref 0.1–0.9)
Monocytes: 6 %
Neutrophils Absolute: 7.3 10*3/uL — ABNORMAL HIGH (ref 1.4–7.0)
Neutrophils: 76 %
Platelets: 409 10*3/uL (ref 150–450)
RBC: 4.86 x10E6/uL (ref 3.77–5.28)
RDW: 15.3 % (ref 11.7–15.4)
RPR Ser Ql: NONREACTIVE
Rh Factor: POSITIVE
Rubella Antibodies, IGG: 1.58 {index} (ref >0.99)
WBC: 9.6 10*3/uL (ref 3.4–10.8)

## 2023-11-16 LAB — HEMOGLOBIN A1C
Est. average glucose Bld gHb Est-mCnc: 120 mg/dL
Hgb A1c MFr Bld: 5.8 % — ABNORMAL HIGH (ref 4.8–5.6)

## 2023-11-16 LAB — COMPREHENSIVE METABOLIC PANEL WITH GFR
ALT: 33 IU/L — ABNORMAL HIGH (ref 0–32)
AST: 54 IU/L — ABNORMAL HIGH (ref 0–40)
Albumin: 4.4 g/dL (ref 3.9–4.9)
Alkaline Phosphatase: 156 IU/L — ABNORMAL HIGH (ref 44–121)
BUN/Creatinine Ratio: 12 (ref 9–23)
BUN: 8 mg/dL (ref 6–20)
Bilirubin Total: 0.2 mg/dL (ref 0.0–1.2)
CO2: 19 mmol/L — ABNORMAL LOW (ref 20–29)
Calcium: 10.1 mg/dL (ref 8.7–10.2)
Chloride: 100 mmol/L (ref 96–106)
Creatinine, Ser: 0.69 mg/dL (ref 0.57–1.00)
Globulin, Total: 2.9 g/dL (ref 1.5–4.5)
Glucose: 80 mg/dL (ref 70–99)
Potassium: 4.7 mmol/L (ref 3.5–5.2)
Sodium: 136 mmol/L (ref 134–144)
Total Protein: 7.3 g/dL (ref 6.0–8.5)
eGFR: 119 mL/min/{1.73_m2} (ref >59)

## 2023-11-16 LAB — URINE CULTURE

## 2023-11-18 ENCOUNTER — Encounter: Payer: Self-pay | Admitting: Women's Health

## 2023-11-19 ENCOUNTER — Other Ambulatory Visit

## 2023-11-19 DIAGNOSIS — Z131 Encounter for screening for diabetes mellitus: Secondary | ICD-10-CM

## 2023-11-19 DIAGNOSIS — Z3A12 12 weeks gestation of pregnancy: Secondary | ICD-10-CM

## 2023-11-19 DIAGNOSIS — R7309 Other abnormal glucose: Secondary | ICD-10-CM

## 2023-11-20 ENCOUNTER — Encounter: Payer: Self-pay | Admitting: Women's Health

## 2023-11-20 DIAGNOSIS — R87619 Unspecified abnormal cytological findings in specimens from cervix uteri: Secondary | ICD-10-CM | POA: Insufficient documentation

## 2023-11-20 LAB — CYTOLOGY - PAP
Chlamydia: NEGATIVE
Comment: NEGATIVE
Comment: NEGATIVE
Comment: NEGATIVE
Comment: NEGATIVE
Comment: NORMAL
Diagnosis: UNDETERMINED — AB
HPV 16: NEGATIVE
HPV 18 / 45: NEGATIVE
High risk HPV: POSITIVE — AB
Neisseria Gonorrhea: NEGATIVE

## 2023-11-20 LAB — GLUCOSE TOLERANCE, 2 HOURS W/ 1HR
Glucose, 1 hour: 89 mg/dL (ref 70–179)
Glucose, 2 hour: 84 mg/dL (ref 70–152)
Glucose, Fasting: 84 mg/dL (ref 70–91)

## 2023-11-22 LAB — PANORAMA PRENATAL TEST FULL PANEL:PANORAMA TEST PLUS 5 ADDITIONAL MICRODELETIONS: FETAL FRACTION: 5.6

## 2023-11-23 LAB — HORIZON CUSTOM: REPORT SUMMARY: NEGATIVE

## 2023-11-25 ENCOUNTER — Encounter: Payer: Self-pay | Admitting: Women's Health

## 2023-12-06 NOTE — BH Specialist Note (Addendum)
 Integrated Behavioral Health via Telemedicine Visit  12/19/2023 Melanie Price [id number]  Number of Integrated Behavioral Health Clinician visits: 1- Initial Visit  Session Start time: [id number]   Session End time: 1018  Total time in minutes: 20   Referring Provider: Suzen Price, CNM Patient/Family location: Home [facility] Provider location: [facility] [address] [facility]  All persons participating in visit: Patient Melanie Price and Melanie Price   Types of Service: Individual psychotherapy and video visit  I connected with Melanie Price's n/a via  Telephone or Video Enabled Telemedicine Application  (Video is [employer] application) and verified that I am speaking with the correct person using two identifiers. Discussed confidentiality: Yes   I discussed the limitations of telemedicine and the availability of in person appointments.  Discussed there is a possibility of technology failure and discussed alternative modes of communication if that failure occurs.  I discussed that engaging in this telemedicine visit, they consent to the provision of behavioral healthcare and the services will be billed under their insurance.  Patient and/or legal guardian expressed understanding and consented to Telemedicine visit: Yes   Presenting Concerns: Patient and/or family reports the following symptoms/concerns: Increased anxiety and depression, attributes to life/financial stress after losing job seven months ago(making over double current income), followed by positive pregnancy; worry about managing childcare and finances in the future postpartum; EBT reduction. Pt is coping with the support of her Nana/grandma; FOB not expected to be a support.  Duration of problem: Current pregnancy; Severity of problem: moderate  Patient and/or Family's Strengths/Protective Factors: Sense of purpose and Physical Health (exercise, healthy diet,  medication compliance, etc.)  Goals Addressed: Patient will:  Reduce symptoms of: anxiety, depression, and stress   Increase knowledge and/or ability of: stress reduction   Demonstrate ability to: Increase healthy adjustment to current life circumstances  Progress towards Goals: Ongoing  Interventions: Interventions utilized:  Motivational Interviewing, Psychoeducation and/or Health Education, Link to [employer], and Supportive Reflection Standardized Assessments completed: Not Needed  Patient and/or Family Response: Patient agrees with treatment plan.   Assessment: Patient currently experiencing Adjustment disorder with mixed anxious and depressed mood; Psychosocial stress.   Patient may benefit from psychoeducation and brief therapeutic interventions regarding coping with symptoms of anxiety, depression, life stress  .  Plan: Follow up with behavioral health clinician on : One month Behavioral recommendations:  -Continue prioritizing healthy self-care (regular meals, adequate rest; allowing practical help from supportive friends and family)  -Accept referral to [employer] -Read through information on After Visit Summary; use as needed and discussed  Referral(s): Integrated Art gallery manager (In Clinic) and [employer]:  Advice worker  I discussed the assessment and treatment plan with the patient and/or parent/guardian. They were provided an opportunity to ask questions and all were answered. They agreed with the plan and demonstrated an understanding of the instructions.   They were advised to call back or seek an in-person evaluation if the symptoms worsen or if the condition fails to improve as anticipated.  Warren JAYSON Mering, LCSW     11/14/2023   10:49 AM 04/16/2017    9:52 AM 03/05/2017   10:05 AM 11/20/2016   10:34 AM  Depression screen PHQ 2/9  Decreased Interest 1 2 0 3  Down, Depressed, Hopeless 1 1 0 2  PHQ - 2 Score 2 3 0 5   Altered sleeping 1 2  3   Tired, decreased energy 1 3  3  Change in appetite 1 3  3   Feeling bad or failure about yourself  1 1  3   Trouble concentrating 1 1  2   Moving slowly or fidgety/restless 1 0  1  Suicidal thoughts 0 0  1  PHQ-9 Score 8 13  21   Difficult doing work/chores  Extremely dIfficult  Very difficult      11/14/2023   10:49 AM  GAD 7 : Generalized Anxiety Score  Nervous, Anxious, on Edge 1  Control/stop worrying 1  Worry too much - different things 1  Trouble relaxing 1  Restless 1  Easily annoyed or irritable 1  Afraid - awful might happen 1  Total GAD 7 Score 7

## 2023-12-12 ENCOUNTER — Encounter: Payer: Self-pay | Admitting: Women's Health

## 2023-12-12 ENCOUNTER — Encounter: Admitting: Advanced Practice Midwife

## 2023-12-12 ENCOUNTER — Other Ambulatory Visit (HOSPITAL_COMMUNITY)
Admission: RE | Admit: 2023-12-12 | Discharge: 2023-12-12 | Disposition: A | Discharge status: Home / Self Care | Source: Ambulatory Visit | Attending: Women's Health | Admitting: Women's Health

## 2023-12-12 ENCOUNTER — Ambulatory Visit: Admitting: Women's Health

## 2023-12-12 VITALS — BP 111/67 | HR 78 | Wt 205.6 lb

## 2023-12-12 DIAGNOSIS — N898 Other specified noninflammatory disorders of vagina: Secondary | ICD-10-CM | POA: Diagnosis present

## 2023-12-12 DIAGNOSIS — Z3482 Encounter for supervision of other normal pregnancy, second trimester: Secondary | ICD-10-CM

## 2023-12-12 DIAGNOSIS — R87610 Atypical squamous cells of undetermined significance on cytologic smear of cervix (ASC-US): Secondary | ICD-10-CM

## 2023-12-12 DIAGNOSIS — Z363 Encounter for antenatal screening for malformations: Secondary | ICD-10-CM

## 2023-12-12 DIAGNOSIS — R7989 Other specified abnormal findings of blood chemistry: Secondary | ICD-10-CM

## 2023-12-12 DIAGNOSIS — Z1379 Encounter for other screening for genetic and chromosomal anomalies: Secondary | ICD-10-CM

## 2023-12-12 DIAGNOSIS — Z3480 Encounter for supervision of other normal pregnancy, unspecified trimester: Secondary | ICD-10-CM

## 2023-12-12 DIAGNOSIS — Z3A16 16 weeks gestation of pregnancy: Secondary | ICD-10-CM

## 2023-12-12 NOTE — Patient Instructions (Signed)
 Melanie Price, thank you for choosing our office today! We appreciate the opportunity to meet your healthcare needs. You may receive a short survey by mail, e-mail, or through [employer]. If you are happy with your care we would appreciate if you could take just a few minutes to complete the survey questions. We read all of your comments and take your feedback very seriously. Thank you again for choosing our office.  Center for [employer] Team at [facility] at [facility] ([address] [place], [place] [address]) Entrance C, located off of [address] Free 24/7 valet parking  Go to [email or web address] to register for FREE online childbirth classes  Call the office [phone]) or go to [facility] if: You begin to severe cramping Your water breaks.  Sometimes it is a big gush of fluid, sometimes it is just a trickle that keeps getting your panties wet or running down your legs You have vaginal bleeding.  It is normal to have a small amount of spotting if your cervix was checked.   [facility] Pediatricians/Family Doctors [facility]): [address] Meg Spina, [phone]           [facility]: [address], [phone]                [facility] Medicine [facility]): [address], [phone] (call to ask if accepting patients) [facility] Department: [address] [place], [phone]    [facility] Pediatricians/Family Doctors [facility]): [phone] [address], [phone] [facility]: [address] [place], [phone] [facility]: [address], [phone]  [facility] Doctors  [facility]): [phone] [facility]: [address], [phone]   [facility] Doctors [facility]: [address], [phone]  [facility] Doctors  [facility]  [facility]: [phone], [phone]  Home Blood Pressure Monitoring for Patients   Your provider has recommended that you check your blood pressure (BP) at least once a week at home. If you do not have a blood pressure cuff at home, one will be provided for you. Contact your provider if you have not received your monitor within 1 week.   Helpful Tips for Accurate Home Blood Pressure Checks  Don't smoke, exercise, or drink caffeine  30 minutes before checking your BP Use the restroom before checking your BP (a full bladder can raise your pressure) Relax in a comfortable upright chair Feet on the ground Left arm resting comfortably on a flat surface at the level of your heart Legs uncrossed Back supported Sit quietly and don't talk Place the cuff on your bare arm Adjust snuggly, so that only two fingertips can fit between your skin and the top of the cuff Check 2 readings separated by at least one minute Keep a log of your BP readings For a visual, please reference this diagram: http://ccnc.care/bpdiagram  Provider Name: [facility]     Phone: [phone]  Zone 1: ALL CLEAR  Continue to monitor your symptoms:  BP reading is less than 140 (top number) or less than 90 (bottom number)  No right upper stomach pain No headaches or seeing spots No feeling nauseated or throwing up No swelling in face and hands  Zone 2: CAUTION Call your doctor's office for any of the following:  BP reading is greater than 140 (top number) or greater than  90 (bottom number)  Stomach pain under your ribs in the middle or right side Headaches or seeing spots Feeling nauseated or throwing up Swelling in face and hands  Zone 3: EMERGENCY  Seek immediate medical care if you have any of the following:  BP reading is greater than160 (top number) or greater than 110 (bottom number) Severe headaches not improving with Tylenol  Serious difficulty catching your breath Any worsening symptoms from  Zone 2     Second Trimester of Pregnancy The second trimester is from week 14 through week 27 (months 4 through 6). The second trimester is often a time when you feel your best. Your body has adjusted to being pregnant, and you begin to feel better physically. Usually, morning sickness has lessened or quit completely, you may have more energy, and you may have an increase in appetite. The second trimester is also a time when the fetus is growing rapidly. At the end of the sixth month, the fetus is about 9 inches long and weighs about 1 pounds. You will likely begin to feel the baby move (quickening) between 16 and 20 weeks of pregnancy. Body changes during your second trimester Your body continues to go through many changes during your second trimester. The changes vary from woman to woman. Your weight will continue to increase. You will notice your lower abdomen bulging out. You may begin to get stretch marks on your hips, abdomen, and breasts. You may develop headaches that can be relieved by medicines. The medicines should be approved by your health care provider. You may urinate more often because the fetus is pressing on your bladder. You may develop or continue to have heartburn as a result of your pregnancy. You may develop constipation because certain hormones are causing the muscles that push waste through your intestines to slow down. You may develop hemorrhoids or swollen, bulging veins (varicose veins). You may have back pain. This is caused by: Weight gain. Pregnancy hormones that are relaxing the joints in your pelvis. A shift in weight and the muscles that support your balance. Your breasts will continue to grow and they will continue to become tender. Your gums may bleed and may be sensitive to brushing and flossing. Dark spots or blotches (chloasma, mask of pregnancy) may develop on your face. This will likely fade after the baby is born. A dark line from your belly button to  the pubic area (linea nigra) may appear. This will likely fade after the baby is born. You may have changes in your hair. These can include thickening of your hair, rapid growth, and changes in texture. Some women also have hair loss during or after pregnancy, or hair that feels dry or thin. Your hair will most likely return to normal after your baby is born.  What to expect at prenatal visits During a routine prenatal visit: You will be weighed to make sure you and the fetus are growing normally. Your blood pressure will be taken. Your abdomen will be measured to track your baby's growth. The fetal heartbeat will be listened to. Any test results from the previous visit will be discussed.  Your health care provider may ask you: How you are feeling. If you are feeling the baby move. If you have had any abnormal symptoms, such as leaking fluid, bleeding, severe headaches, or abdominal cramping. If you are using any tobacco products, including cigarettes, chewing tobacco, and electronic cigarettes. If you have any questions.  Other tests that may be performed during  your second trimester include: Blood tests that check for: Low iron levels (anemia). High blood sugar that affects pregnant women (gestational diabetes) between 78 and 28 weeks. Rh antibodies. This is to check for a protein on red blood cells (Rh factor). Urine tests to check for infections, diabetes, or protein in the urine. An ultrasound to confirm the proper growth and development of the baby. An amniocentesis to check for possible genetic problems. Fetal screens for spina bifida and Down syndrome. HIV (human immunodeficiency virus) testing. Routine prenatal testing includes screening for HIV, unless you choose not to have this test.  Follow these instructions at home: Medicines Follow your health care provider's instructions regarding medicine use. Specific medicines may be either safe or unsafe to take during  pregnancy. Take a prenatal vitamin that contains at least 600 micrograms (mcg) of folic acid. If you develop constipation, try taking a stool softener if your health care provider approves. Eating and drinking Eat a balanced diet that includes fresh fruits and vegetables, whole grains, good sources of protein such as meat, eggs, or tofu, and low-fat dairy. Your health care provider will help you determine the amount of weight gain that is right for you. Avoid raw meat and uncooked cheese. These carry germs that can cause birth defects in the baby. If you have low calcium  intake from food, talk to your health care provider about whether you should take a daily calcium  supplement. Limit foods that are high in fat and processed sugars, such as fried and sweet foods. To prevent constipation: Drink enough fluid to keep your urine clear or pale yellow. Eat foods that are high in fiber, such as fresh fruits and vegetables, whole grains, and beans. Activity Exercise only as directed by your health care provider. Most women can continue their usual exercise routine during pregnancy. Try to exercise for 30 minutes at least 5 days a week. Stop exercising if you experience uterine contractions. Avoid heavy lifting, wear low heel shoes, and practice good posture. A sexual relationship may be continued unless your health care provider directs you otherwise. Relieving pain and discomfort Wear a good support bra to prevent discomfort from breast tenderness. Take warm sitz baths to soothe any pain or discomfort caused by hemorrhoids. Use hemorrhoid cream if your health care provider approves. Rest with your legs elevated if you have leg cramps or low back pain. If you develop varicose veins, wear support hose. Elevate your feet for 15 minutes, 3-4 times a day. Limit salt in your diet. Prenatal Care Write down your questions. Take them to your prenatal visits. Keep all your prenatal visits as told by your health  care provider. This is important. Safety Wear your seat belt at all times when driving. Make a list of emergency phone numbers, including numbers for family, friends, the hospital, and police and fire departments. General instructions Ask your health care provider for a referral to a local prenatal education class. Begin classes no later than the beginning of month 6 of your pregnancy. Ask for help if you have counseling or nutritional needs during pregnancy. Your health care provider can offer advice or refer you to specialists for help with various needs. Do not use hot tubs, steam rooms, or saunas. Do not douche or use tampons or scented sanitary pads. Do not cross your legs for long periods of time. Avoid cat litter boxes and soil used by cats. These carry germs that can cause birth defects in the baby and possibly loss of the  fetus by miscarriage or stillbirth. Avoid all smoking, herbs, alcohol, and unprescribed drugs. Chemicals in these products can affect the formation and growth of the baby. Do not use any products that contain nicotine or tobacco, such as cigarettes and e-cigarettes. If you need help quitting, ask your health care provider. Visit your dentist if you have not gone yet during your pregnancy. Use a soft toothbrush to brush your teeth and be gentle when you floss. Contact a health care provider if: You have dizziness. You have mild pelvic cramps, pelvic pressure, or nagging pain in the abdominal area. You have persistent nausea, vomiting, or diarrhea. You have a bad smelling vaginal discharge. You have pain when you urinate. Get help right away if: You have a fever. You are leaking fluid from your vagina. You have spotting or bleeding from your vagina. You have severe abdominal cramping or pain. You have rapid weight gain or weight loss. You have shortness of breath with chest pain. You notice sudden or extreme swelling of your face, hands, ankles, feet, or legs. You  have not felt your baby move in over an hour. You have severe headaches that do not go away when you take medicine. You have vision changes. Summary The second trimester is from week 14 through week 27 (months 4 through 6). It is also a time when the fetus is growing rapidly. Your body goes through many changes during pregnancy. The changes vary from woman to woman. Avoid all smoking, herbs, alcohol, and unprescribed drugs. These chemicals affect the formation and growth your baby. Do not use any tobacco products, such as cigarettes, chewing tobacco, and e-cigarettes. If you need help quitting, ask your health care provider. Contact your health care provider if you have any questions. Keep all prenatal visits as told by your health care provider. This is important. This information is not intended to replace advice given to you by your health care provider. Make sure you discuss any questions you have with your health care provider. Document Released: 07/17/2001 Document Revised: 12/29/2015 Document Reviewed: 09/23/2012 Elsevier Interactive Patient Education  2017 Elsevier Inc.   Colposcopy, Care After  The following information offers guidance on how to care for yourself after your procedure. Your health care provider may also give you more specific instructions. If you have problems or questions, contact your health care provider. What can I expect after the procedure? If you had a colposcopy without a biopsy, you can expect to feel fine right away after your procedure. However, you may have some spotting of blood for a few days. You can return to your normal activities. If you had a colposcopy with a biopsy, it is common after the procedure to have: Soreness and mild pain. These may last for a few days. Mild vaginal bleeding or discharge that is dark-colored and grainy. This may last for a few days. The discharge may be caused by a liquid (solution) that was used during the procedure. You may  need to wear a sanitary pad during this time. Spotting of blood for at least 48 hours after the procedure. Follow these instructions at home: Medicines Take over-the-counter and prescription medicines only as told by your health care provider. Talk with your health care provider about what type of over-the-counter pain medicines and prescription medicines you can start to take again. It is especially important to talk with your health care provider if you take blood thinners. Activity Avoid using douche products, using tampons, and having sex for at least  3 days after the procedure or for as long as told by your health care provider. Return to your normal activities as told by your health care provider. Ask your health care provider what activities are safe for you. General instructions Ask your health care provider if you may take baths, swim, or use a hot tub. You may take showers. If you use birth control (contraception), continue to use it. Keep all follow-up visits. This is important. Contact a health care provider if: You have a fever or chills. You faint or feel light-headed. Get help right away if: You have heavy bleeding from your vagina or pass blood clots. Heavy bleeding is bleeding that soaks through a sanitary pad in less than 1 hour. You have vaginal discharge that is abnormal, is yellow in color, or smells bad. This could be a sign of infection. You have severe pain or cramps in your lower abdomen that do not go away with medicine. Summary If you had a colposcopy without a biopsy, you can expect to feel fine right away, but you may have some spotting of blood for a few days. You can return to your normal activities. If you had a colposcopy with a biopsy, it is common to have mild pain for a few days and spotting for 48 hours after the procedure. Avoid using douche products, using tampons, and having sex for at least 3 days after the procedure or for as long as told by your  health care provider. Get help right away if you have heavy bleeding, severe pain, or signs of infection. This information is not intended to replace advice given to you by your health care provider. Make sure you discuss any questions you have with your health care provider. Document Revised: 12/18/2020 Document Reviewed: 12/18/2020 Elsevier Patient Education  2024 [employer].

## 2023-12-12 NOTE — Progress Notes (Signed)
 LOW-RISK PREGNANCY VISIT WITH COLPOSCOPY Patient name: Melanie Price MRN [id number]  Date of birth: July 12, 1991 Chief Complaint:   Routine Prenatal Visit and Colposcopy  History of Present Illness:   Melanie Price is a 33 y.o. [id number] female at [redacted]w[redacted]d with an Estimated Date of Delivery: 05/27/24 being seen today for ongoing management of a low-risk pregnancy.   Today she reports vulvovaginal inflammation/irritation x few days, tried monistat x 2, not helping. No itching/odor. Contractions: Not present.  .  Movement: Present. denies leaking of fluid.     11/14/2023   10:49 AM 04/16/2017    9:52 AM 03/05/2017   10:05 AM 11/20/2016   10:34 AM  Depression screen PHQ 2/9  Decreased Interest 1 2 0 3  Down, Depressed, Hopeless 1 1 0 2  PHQ - 2 Score 2 3 0 5  Altered sleeping 1 2  3   Tired, decreased energy 1 3  3   Change in appetite 1 3  3   Feeling bad or failure about yourself  1 1  3   Trouble concentrating 1 1  2   Moving slowly or fidgety/restless 1 0  1  Suicidal thoughts 0 0  1  PHQ-9 Score 8 13  21   Difficult doing work/chores  Extremely dIfficult  Very difficult        11/14/2023   10:49 AM  GAD 7 : Generalized Anxiety Score  Nervous, Anxious, on Edge 1  Control/stop worrying 1  Worry too much - different things 1  Trouble relaxing 1  Restless 1  Easily annoyed or irritable 1  Afraid - awful might happen 1  Total GAD 7 Score 7      Review of Systems:   Pertinent items are noted in HPI Denies abnormal vaginal discharge w/ itching/odor/irritation, headaches, visual changes, shortness of breath, chest pain, abdominal pain, severe nausea/vomiting, or problems with urination or bowel movements unless otherwise stated above. Pertinent History Reviewed:  Reviewed past medical,surgical, social, obstetrical and family history.  Reviewed problem list, medications and allergies. Physical Assessment:   Vitals:   12/12/23 0905  BP: 111/67  Pulse: 78  Weight: 205 lb 9.6 oz  (93.3 kg)  Body mass index is 30.36 kg/m.        Physical Examination:   General appearance: Well appearing, and in no distress  Mental status: Alert, oriented to person, place, and time  Skin: Warm & dry  Cardiovascular: Normal heart rate noted  Respiratory: Normal respiratory effort, no distress  Abdomen: Soft, gravid, nontender  Pelvic: Colpo (see note below) CV swab obtained         Extremities:    Fetal Status: Fetal Heart Rate (bpm): 147   Movement: Present    No results found for this or any previous visit (from the past 24 hours).  Assessment & Plan:  1) Low-risk pregnancy [id number] at [redacted]w[redacted]d with an Estimated Date of Delivery: 05/27/24   2) H/O severe pre-e, ASA  3) Elevated LFTs w/ h/o fatty liver disease> states had improved when she lost weight, repeat CMP today  4) Vulvovaginal inflammation/irritation> CV swab   Meds: No orders of the defined types were placed in this encounter.  Labs/procedures today: colpo and 2nd IT  Plan:  Continue routine obstetrical care  Next visit: prefers will be in person for u/s    Reviewed: Preterm labor symptoms and general obstetric precautions including but not limited to vaginal bleeding, contractions, leaking of fluid and fetal movement were reviewed in detail with the  patient.  All questions were answered. Does have home bp cuff. Office bp cuff given: not applicable. Check bp weekly, let us  know if consistently >140 and/or >90.  Follow-up: Return for As scheduled.  Future Appointments  Date Time Provider Department Center  12/19/2023  9:15 AM [facility] CLINIC[facility]  01/07/2024  9:15 AM [facility] 2 [facility] None  01/07/2024 10:10 AM Zelma Hidden, FNP [facility]    Orders Placed This Encounter  Procedures   US  OB Comp + 14 Wk   INTEGRATED 2   Comprehensive metabolic panel with GFR   Ferd Householder CNM, [facility] 12/12/2023 9:36 AM    COLPOSCOPY PROCEDURE NOTE Patient name: Melanie Price MRN [id number]  Date of birth: Sep 05, 1990 Subjective Findings:   Melanie Price is a 33 y.o. [id number] Caucasian female at [redacted]w[redacted]d being seen today for a colposcopy. Indication: Abnormal pap on 11/14/23: ASCUS w/ HRHPV positive: other (not 16, 18/45)  Prior cytology:  Date Result Procedure  2018 neg None   Patient's last menstrual period was 08/21/2023. Contraception: none. Menopausal: no. Hysterectomy: no.   Smoker: no.  Immunocompromised: yes pregnant.  The risks and benefits were explained and informed consent was obtained, and written copy is in chart. Pertinent History Reviewed:   Reviewed past medical,surgical, social, obstetrical and family history.  Reviewed problem list, medications and allergies. Objective Findings & Procedure:   Vitals:   12/12/23 0905  BP: 111/67  Pulse: 78  Weight: 205 lb 9.6 oz (93.3 kg)  Body mass index is 30.36 kg/m.  Time out was performed.  Speculum placed in the vagina, cervix fully visualized. SCJ: fully visualized. Cervix swabbed x 3 with acetic acid.  Acetowhitening present: Yes Cervix: no visible lesions, no mosaicism, no punctation, no abnormal vasculature, and acetowhite lesion(s) noted at 12 o'clock. No biopsies taken (pregnant). Vagina: vaginal colposcopy not performed Vulva: vulvar colposcopy not performed  Specimens: 0  Complications: none  Chaperone: Lorean Rodes  Colposcopic Impression & Plan:   Colposcopy findings consistent with LSIL Plan: Repeat colposcopy postpartum  Return for As scheduled.  Ferd Householder CNM, [facility] 12/12/2023 9:36 AM

## 2023-12-13 LAB — CERVICOVAGINAL ANCILLARY ONLY
Bacterial Vaginitis (gardnerella): NEGATIVE
Candida Glabrata: NEGATIVE
Candida Vaginitis: POSITIVE — AB
Chlamydia: NEGATIVE
Comment: NEGATIVE
Comment: NEGATIVE
Comment: NEGATIVE
Comment: NEGATIVE
Comment: NEGATIVE
Comment: NORMAL
Neisseria Gonorrhea: NEGATIVE
Trichomonas: NEGATIVE

## 2023-12-13 LAB — COMPREHENSIVE METABOLIC PANEL WITH GFR
ALT: 20 IU/L (ref 0–32)
AST: 43 IU/L — ABNORMAL HIGH (ref 0–40)
Albumin: 4.1 g/dL (ref 3.9–4.9)
Alkaline Phosphatase: 154 IU/L — ABNORMAL HIGH (ref 44–121)
BUN/Creatinine Ratio: 12 (ref 9–23)
BUN: 7 mg/dL (ref 6–20)
Bilirubin Total: 0.2 mg/dL (ref 0.0–1.2)
CO2: 18 mmol/L — ABNORMAL LOW (ref 20–29)
Calcium: 9.2 mg/dL (ref 8.7–10.2)
Chloride: 102 mmol/L (ref 96–106)
Creatinine, Ser: 0.6 mg/dL (ref 0.57–1.00)
Globulin, Total: 2.7 g/dL (ref 1.5–4.5)
Glucose: 83 mg/dL (ref 70–99)
Potassium: 4 mmol/L (ref 3.5–5.2)
Sodium: 135 mmol/L (ref 134–144)
Total Protein: 6.8 g/dL (ref 6.0–8.5)
eGFR: 123 mL/min/{1.73_m2} (ref >59)

## 2023-12-14 LAB — INTEGRATED 2
AFP MoM: 1.71
Alpha-Fetoprotein: 42.1 ng/mL
Crown Rump Length: 58.2 mm
DIA MoM: 0.91
DIA Value: 120.5 pg/mL
Estriol, Unconjugated: 0.76 ng/mL
Gest. Age on Collection Date: 12.1 wk
Gestational Age: 16.1 wk
Maternal Age at EDD: 32.3 a
Nuchal Translucency (NT): 1.3 mm
Nuchal Translucency MoM: 0.85
Number of Fetuses: 1
PAPP-A MoM: 0.51
PAPP-A Value: 241.6 ng/mL
Sonographer ID#: 309760
Test Results:: NEGATIVE
Weight: 210 [lb_av]
Weight: 210 [lb_av]
hCG MoM: 0.65
hCG Value: 18.6 [IU]/mL
uE3 MoM: 0.79

## 2023-12-16 ENCOUNTER — Encounter: Payer: Self-pay | Admitting: Women's Health

## 2023-12-16 MED ORDER — TERCONAZOLE 0.4 % VA CREA
1.0000 | TOPICAL_CREAM | Freq: Every day | VAGINAL | 0 refills | Status: AC
Start: 2023-12-16 — End: ?

## 2023-12-16 NOTE — Addendum Note (Signed)
 Addended by: Glinda Lapping R on: 12/16/2023 10:40 AM   Modules accepted: Orders

## 2023-12-19 ENCOUNTER — Ambulatory Visit: Payer: Self-pay | Admitting: Women's Health

## 2023-12-19 ENCOUNTER — Ambulatory Visit: Payer: Self-pay | Admitting: Clinical

## 2023-12-19 DIAGNOSIS — F4323 Adjustment disorder with mixed anxiety and depressed mood: Secondary | ICD-10-CM | POA: Diagnosis not present

## 2023-12-19 NOTE — Patient Instructions (Addendum)
 Center for [facility] at [facility] [address] [place], [place] [address] [phone] (main office) [phone] [facility] office)  [email or web address]  [place] [facility]  (Childcare options, Early childcare development, etc.) [email or web address]  [place] [facility]  Child Care Facility Search Engine  [email or web address]

## 2024-01-06 ENCOUNTER — Ambulatory Visit: Admitting: Obstetrics & Gynecology

## 2024-01-06 ENCOUNTER — Ambulatory Visit

## 2024-01-06 ENCOUNTER — Encounter: Payer: Self-pay | Admitting: Obstetrics & Gynecology

## 2024-01-06 VITALS — BP 98/53 | HR 78 | Wt 201.8 lb

## 2024-01-06 DIAGNOSIS — Z3482 Encounter for supervision of other normal pregnancy, second trimester: Secondary | ICD-10-CM | POA: Diagnosis not present

## 2024-01-06 DIAGNOSIS — Z363 Encounter for antenatal screening for malformations: Secondary | ICD-10-CM | POA: Diagnosis not present

## 2024-01-06 DIAGNOSIS — Z3480 Encounter for supervision of other normal pregnancy, unspecified trimester: Secondary | ICD-10-CM

## 2024-01-06 DIAGNOSIS — Z3A19 19 weeks gestation of pregnancy: Secondary | ICD-10-CM | POA: Diagnosis not present

## 2024-01-06 NOTE — Progress Notes (Signed)
   LOW-RISK PREGNANCY VISIT Patient name: Melanie Price MRN [id number]  Date of birth: Jul 05, 1991 Chief Complaint:   Routine Prenatal Visit  History of Present Illness:   Melanie Price is a 33 y.o. [id number] female at [redacted]w[redacted]d with an Estimated Date of Delivery: 05/27/24 being seen today for ongoing management of a low-risk pregnancy.   -h/o severe preE -Anxiety: on Effexor     11/14/2023   10:49 AM 04/16/2017    9:52 AM 03/05/2017   10:05 AM 11/20/2016   10:34 AM  Depression screen PHQ 2/9  Decreased Interest 1 2 0 3  Down, Depressed, Hopeless 1 1 0 2  PHQ - 2 Score 2 3 0 5  Altered sleeping 1 2  3   Tired, decreased energy 1 3  3   Change in appetite 1 3  3   Feeling bad or failure about yourself  1 1  3   Trouble concentrating 1 1  2   Moving slowly or fidgety/restless 1 0  1  Suicidal thoughts 0 0  1  PHQ-9 Score 8 13  21   Difficult doing work/chores  Extremely dIfficult  Very difficult    Today she reports no complaints. Contractions: Not present. Vag. Bleeding: None.  Movement: Present. denies leaking of fluid. Review of Systems:   Pertinent items are noted in HPI Denies abnormal vaginal discharge w/ itching/odor/irritation, headaches, visual changes, shortness of breath, chest pain, abdominal pain, severe nausea/vomiting, or problems with urination or bowel movements unless otherwise stated above. Pertinent History Reviewed:  Reviewed past medical,surgical, social, obstetrical and family history.  Reviewed problem list, medications and allergies.  Physical Assessment:   Vitals:   01/06/24 1128  BP: (!) 98/53  Pulse: 78  Weight: 201 lb 12.8 oz (91.5 kg)  Body mass index is 29.8 kg/m.        Physical Examination:   General appearance: Well appearing, and in no distress  Mental status: Alert, oriented to person, place, and time  Skin: Warm & dry  Respiratory: Normal respiratory effort, no distress  Abdomen: Soft, gravid, nontender  Pelvic: Cervical exam deferred          Extremities:    Psych:  mood and affect appropriate  Fetal Status:     Movement: Present    breech,posterior placenta gr 0,normal ovaries,cx 3.9 cm,SVP of fluid 4.8 cm,EFW 311 g 46%,limited view of spine because of fetal position   Chaperone: n/a    No results found for this or any previous visit (from the past 24 hours).   Assessment & Plan:  1) Low-risk pregnancy [id number] at [redacted]w[redacted]d with an Estimated Date of Delivery: 05/27/24   -incomplete anatomy scan, []  plan for f/u in 4-5wks -routine OB care   Meds: No orders of the defined types were placed in this encounter.  Labs/procedures today: anatomy scan  Plan:  Continue routine obstetrical care  Next visit: prefers in person    Reviewed: Preterm labor symptoms and general obstetric precautions including but not limited to vaginal bleeding, contractions, leaking of fluid and fetal movement were reviewed in detail with the patient.  All questions were answered.  Follow-up: Return in about 4 weeks (around 02/03/2024) for LROB visit and follow up anatomy scan .  Orders Placed This Encounter  Procedures   US  OB Comp + 14 Wk    Zahara Rembert, DO Attending Obstetrician & Gynecologist, [facility] for [employer], [employer]

## 2024-01-06 NOTE — Progress Notes (Signed)
 US  19+5 WKS,breech,posterior placenta gr 0,normal ovaries,cx 3.9 cm,SVP of fluid 4.8 cm,EFW 311 g 46%,limited view of spine because of fetal position,please have pt come back for additional images,no obvious abnormalities

## 2024-01-07 ENCOUNTER — Encounter: Admitting: Obstetrics and Gynecology

## 2024-01-07 ENCOUNTER — Other Ambulatory Visit: Admitting: Radiology

## 2024-01-15 ENCOUNTER — Ambulatory Visit: Payer: Self-pay | Admitting: Clinical

## 2024-01-15 DIAGNOSIS — F4323 Adjustment disorder with mixed anxiety and depressed mood: Secondary | ICD-10-CM

## 2024-01-15 NOTE — BH Specialist Note (Signed)
 Integrated Behavioral Health via Telemedicine Visit  01/16/2024 CALAYA GILDNER [id number]  Number of Integrated Behavioral Health Clinician visits: 2- Second Visit  Session Start time: 1548   Session End time: 1639  Total time in minutes: 51   Referring Provider: Glinda Lapping, CNM Patient/Family location: Home [facility] Provider location: [facility]  All persons participating in visit: Patient Deanie Jupiter and [facility] Brahm Barbeau    Types of Service: Individual psychotherapy and Telephone visit  I connected with Canyon C Cheaney and/or Belicia C Hartstein's n/a via  Telephone or Video Enabled Telemedicine Application  (Video is Caregility application) and verified that I am speaking with the correct person using two identifiers. Discussed confidentiality: Yes   I discussed the limitations of telemedicine and the availability of in person appointments.  Discussed there is a possibility of technology failure and discussed alternative modes of communication if that failure occurs.  I discussed that engaging in this telemedicine visit, they consent to the provision of behavioral healthcare and the services will be billed under their insurance.  Patient and/or legal guardian expressed understanding and consented to Telemedicine visit: Yes   Presenting Concerns: Patient and/or family reports the following symptoms/concerns: Processing negative response to positive pregnancy news by baby's father and his family. Duration of problem: Current pregnancy; Severity of problem: moderate  Patient and/or Family's Strengths/Protective Factors: Sense of purpose and Physical Health (exercise, healthy diet, medication compliance, etc.)  Goals Addressed: Patient will:  Reduce symptoms of: anxiety, depression, and stress    Demonstrate ability to: Increase healthy adjustment to current life circumstances  Progress towards  Goals: Ongoing    Interventions: Interventions utilized:  Supportive Reflection Standardized Assessments completed: Not Needed    Patient and/or Family Response: Patient agrees with treatment plan.   Clinical Assessment/Diagnosis  Adjustment disorder with mixed anxiety and depressed mood    Assessment: Patient currently experiencing Adjustment disorder with mixed anxious and depressed mood; Psychosocial stress.   Patient may benefit from continued therapeutic intervention.  Plan: Follow up with behavioral health clinician on : One month Behavioral recommendations:  -Continue taking prenatal vitamin and Effexor as prescribed -Continue prioritizing healthy self-care daily (healthy meals and sleep; positive outlook; increase time with supportive people in life; decrease time with unsupportive people in life) --Read through information on After Visit Summary; use as needed and discussed  Referral(s): Integrated [employer] (In Clinic)  I discussed the assessment and treatment plan with the patient and/or parent/guardian. They were provided an opportunity to ask questions and all were answered. They agreed with the plan and demonstrated an understanding of the instructions.   They were advised to call back or seek an in-person evaluation if the symptoms worsen or if the condition fails to improve as anticipated.  Georgia Kipper, LCSW     11/14/2023   10:49 AM 04/16/2017    9:52 AM 03/05/2017   10:05 AM 11/20/2016   10:34 AM  Depression screen PHQ 2/9  Decreased Interest 1 2 0 3  Down, Depressed, Hopeless 1 1 0 2  PHQ - 2 Score 2 3 0 5  Altered sleeping 1 2  3   Tired, decreased energy 1 3  3   Change in appetite 1 3  3   Feeling bad or failure about yourself  1 1  3   Trouble concentrating 1 1  2   Moving slowly or fidgety/restless 1 0  1  Suicidal thoughts 0 0  1   PHQ-9  Score 8 13  21   Difficult doing work/chores  Extremely dIfficult  Very difficult      Data saved with a previous flowsheet row definition      11/14/2023   10:49 AM  GAD 7 : Generalized Anxiety Score  Nervous, Anxious, on Edge 1  Control/stop worrying 1  Worry too much - different things 1  Trouble relaxing 1  Restless 1  Easily annoyed or irritable 1  Afraid - awful might happen 1  Total GAD 7 Score 7

## 2024-01-15 NOTE — Patient Instructions (Addendum)
 Center for [facility] at [facility] [address] [place], [place] [address] [phone] (main office) [phone] (Ignacio Lowder's office)     BRAINSTORMING  Develop a Plan Goals: Provide a way to start conversation about your new life with a baby Assist parents in recognizing and using resources within their reach Help pave the way before birth for an easier period of transition afterwards.  Make a list of the following information to keep in a central location: Full name of Mom and Partner: _____________________________________________ Baby's full name and Date of Birth: ___________________________________________ Home Address: ___________________________________________________________ ________________________________________________________________________ Home Phone: ____________________________________________________________ Parents' cell numbers: _____________________________________________________ ________________________________________________________________________ Name and contact info for OB: ______________________________________________ Name and contact info for Pediatrician:________________________________________ Contact info for Lactation Consultants: ________________________________________  REST and SLEEP *You each need at least 4-5 hours of uninterrupted sleep every day. Write specific names and contact information.* How are you going to rest in the postpartum period? While partner's home? When partner returns to work? When you both return to work? Where will your baby sleep? Who is available to help during the day? Evening? Night? Who could move in for a period to help support you? What are some ideas to help you get enough  sleep? __________________________________________________________________________________________________________________________________________________________________________________________________________________________________________ NUTRITIOUS FOOD AND DRINK *Plan for meals before your baby is born so you can have healthy food to eat during the immediate postpartum period.* Who will look after breakfast? Lunch? Dinner? List names and contact information. Brainstorm quick, healthy ideas for each meal. What can you do before baby is born to prepare meals for the postpartum period? How can others help you with meals? Which grocery stores provide online shopping and delivery? Which restaurants offer take-out or delivery options? ______________________________________________________________________________________________________________________________________________________________________________________________________________________________________________________________________________________________________________________________________________________________________________________________________  CARE FOR MOM *It's important that mom is cared for and pampered in the postpartum period. Remember, the most important ways new mothers need care are: sleep, nutrition, gentle exercise, and time off.* Who can come take care of mom during this period? Make a list of people with their contact information. List some activities that make you feel cared for, rested, and energized? Who can make sure you have opportunities to do these things? Does mom have a space of her very own within your home that's just for her? Make a "[facility]" where she can be comfortable, rest, and renew herself  daily. ______________________________________________________________________________________________________________________________________________________________________________________________________________________________________________________________________________________________________________________________________________________________________________________________________    CARE FOR AND FEEDING BABY *Knowledgeable and encouraging people will offer the best support with regard to feeding your baby.* Educate yourself and choose the best feeding option for your baby. Make a list of people who will guide, support, and be a resource for you as your care for and feed your baby. (Friends that have breastfed or are currently breastfeeding, lactation consultants, breastfeeding support groups, etc.) Consider a postpartum doula. (These websites can give you information: dona.org & https://shea.org/) Seek out local breastfeeding resources like the breastfeeding support group at [employer] or [employer]. ______________________________________________________________________________________________________________________________________________________________________________________________________________________________________________________________________________________________________________________________________________________________________________________________________  Judson Roch AND ERRANDS Who can help with a thorough cleaning before baby is born? Make a list of people who will help with housekeeping and chores, like laundry, light cleaning, dishes, bathrooms, etc. Who can run some errands for you? What can you do to make sure you are stocked with basic supplies before baby is born? Who is going to do the  shopping? ______________________________________________________________________________________________________________________________________________________________________________________________________________________________________________________________________________________________________________________________________________________________________________________________________     Family Adjustment *Nurture yourselves.it helps parents be more loving and allows for better bonding with their child.* What sorts of things do you and partner enjoy  doing together? Which activities help you to connect and strengthen your relationship? Make a list of those things. Make a list of people whom you trust to care for your baby so you can have some time together as a couple. What types of things help partner feel connected to Mom? Make a list. What needs will partner have in order to bond with baby? Other children? Who will care for them when you go into labor and while you are in the hospital? Think about what the needs of your older children might be. Who can help you meet those needs? In what ways are you helping them prepare for bringing baby home? List some specific strategies you have for family adjustment. _______________________________________________________________________________________________________________________________________________________________________________________________________________________________________________________________________________________________________________________________________________  SUPPORT *Someone who can empathize with experiences normalizes your problems and makes them more bearable.* Make a list of other friends, neighbors, and/or co-workers you know with infants (and small children, if applicable) with whom you can connect. Make a list of local or online support groups, mom groups, etc. in which you can be  involved. ______________________________________________________________________________________________________________________________________________________________________________________________________________________________________________________________________________________________________________________________________________________________________________________________________  Childcare Plans Investigate and plan for childcare if mom is returning to work. Talk about mom's concerns about her transition back to work. Talk about partner's concerns regarding this transition.  Mental Health *Your mental health is one of the highest priorities for a pregnant or postpartum mom.* 1 in 5 women experience anxiety and/or depression from the time of conception through the first year after birth. Postpartum Mood Disorders are the #1 complication of pregnancy and childbirth and the suffering experienced by these mothers is not necessary! These illnesses are temporary and respond well to treatment, which often includes self-care, social support, talk therapy, and medication when needed. Women experiencing anxiety and depression often say things like: "I'm supposed to be happy.why do I feel so sad?", "Why can't I snap out of it?", "I'm having thoughts that scare me." There is no need to be embarrassed if you are feeling these symptoms: Overwhelmed, anxious, angry, sad, guilty, irritable, hopeless, exhausted but can't sleep You are NOT alone. You are NOT to blame. With help, you WILL be well. Where can I find help? Medical professionals such as your OB, midwife, gynecologist, family practitioner, primary care provider, pediatrician, or mental health providers; [facility] support groups: Feelings After Birth, Breastfeeding Support Group, Baby and Me Group, and Fit 4 Two exercise classes. You have permission to ask for help. It will confirm your feelings, validate your experiences,  share/learn coping strategies, and gain support and encouragement as you heal. You are important! BRAINSTORM Make a list of local resources, including resources for mom and for partner. Identify support groups. Identify people to call late at night - include names and contact info. Talk with partner about perinatal mood and anxiety disorders. Talk with your OB, midwife, and doula about baby blues and about perinatal mood and anxiety disorders. Talk with your pediatrician about perinatal mood and anxiety disorders.   Support & Sanity Savers   What do you really need?  Basics In preparing for a new baby, many expectant parents spend hours shopping for baby clothes, decorating the nursery, and deciding which car seat to buy. Yet most don't think much about what the reality of parenting a newborn will be like, and what they need to make it through that. So, here is the advice of experienced parents. We know you'll read this, and think "they're exaggerating, I don't really need that." Just trust [place] on these, OK? Plan for all of  this, and if it turns out you don't need it, come back and teach [place] how you did it!  Must-Haves (Once baby's survival needs are met, make sure you attend to your own survival needs!) Sleep An average newborn sleeps 16-18 hours per day, over 6-7 sleep periods, rarely more than three hours at a time. It is normal and healthy for a newborn to wake throughout the night... but really hard on parents!! Naps. Prioritize sleep above any responsibilities like: cleaning house, visiting friends, running errands, etc.  Sleep whenever baby sleeps. If you can't nap, at least have restful times when baby eats. The more rest you get, the more patient you will be, the more emotionally stable, and better at solving problems.  Food You may not have realized it would be difficult to eat when you have a newborn. Yet, when we talk to countless new parents, they say things like "it may be 2:00 pm  when I realize I haven't had breakfast yet." Or "every time we sit down to dinner, baby needs to eat, and my food gets cold, so I don't bother to eat it." Finger food. Before your baby is born, stock up with one months' worth of food that: 1) you can eat with one hand while holding a baby, 2) doesn't need to be prepped, 3) is good hot or cold, 4) doesn't spoil when left out for a few hours, and 5) you like to eat. Think about: nuts, dried fruit, Clif bars, pretzels, jerky, gogurt, baby carrots, apples, bananas, crackers, cheez-n-crackers, string cheese, hot pockets or frozen burritos to microwave, garden burgers and breakfast pastries to put in the toaster, yogurt drinks, etc. Restaurant Menus. Make lists of your favorite restaurants & menu items. When family/friends want to help, you can give specific information without much thought. They can either bring you the food or send gift cards for just the right meals. Freezer Meals.  Take some time to make a few meals to put in the freezer ahead of time.  Easy to freeze meals can be anything such as soup, lasagna, chicken pie, or spaghetti sauce. Set up a Meal Schedule.  Ask friends and family to sign up to bring you meals during the first few weeks of being home. (It can be passed around at baby showers!) You have no idea how helpful this will be until you are in the throes of parenting.  Machine[employer].it is a great website to check out. Emotional Support Know who to call when you're stressed out. Parenting a newborn is very challenging work. There are times when it totally overwhelms your normal coping abilities. EVERY NEW PARENT NEEDS TO HAVE A PLAN FOR WHO TO CALL WHEN THEY JUST CAN'T COPE ANY MORE. (And it has to be someone other than the baby's other parent!) Before your baby is born, come up with at least one person you can call for support - write their phone number down and post it on the refrigerator. Anxiety & Sadness. Baby blues are normal after  pregnancy; however, there are more severe types of anxiety & sadness which can occur and should not be ignored.  They are always treatable, but you have to take the first step by reaching out for help. [facility] offers a "Mom Talk" group which meets every Tuesday from 10 am - 11 am.  This group is for new moms who need support and connection after their babies are born.  Call [phone].  Really, Really Helpful (Plan for them!  Make sure these happen often!!) Physical Support with Taking Care of Yourselves Asking friends and family. Before your baby is born, set up a schedule of people who can come and visit and help out (or ask a friend to schedule for you). Any time someone says "let me know what I can do to help," sign them up for a day. When they get there, their job is not to take care of the baby (that's your job and your joy). Their job is to take care of you!  Postpartum doulas. If you don't have anyone you can call on for support, look into postpartum doulas:  professionals at helping parents with caring for baby, caring for themselves, getting breastfeeding started, and helping with household tasks. www.padanc.org is a helpful website for learning about doulas in our area. Peer Support / Parent Groups Why: One of the greatest ideas for new parents is to be around other new parents. Parent groups give you a chance to share and listen to others who are going through the same season of life, get a sense of what is normal infant development by watching several babies learn and grow, share your stories of triumph and struggles with empathetic ears, and forgive your own mistakes when you realize all parents are learning by trial and error. Where to find: There are many places you can meet other new parents throughout our community.  [facility] offers the following classes for new moms and their little ones:  Baby and Me (Birth to Crawling) and Breastfeeding Support Group. Go to  www.conehealthybaby.com or call [phone] for more information. Time for your Relationship It's easy to get so caught up in meeting baby's immediate needs that it's hard to find time to connect with your partner, and meet the needs of your relationship. It's also easy to forget what "quality time with your partner" actually looks like. If you take your baby on a date, you'd be amazed how much of your couple time is spent feeding the baby, diapering the baby, admiring the baby, and talking about the baby. Dating: Try to take time for just the two of you. Babysitter tip: Sometimes when moms are breastfeeding a newborn, they find it hard to figure out how to schedule outings around baby's unpredictable feeding schedules. Have the babysitter come for a three hour period. When she comes over, if baby has just eaten, you can leave right away, and come back in two hours. If baby hasn't fed recently, you start the date at home. Once baby gets hungry and gets a good feeding in, you can head out for the rest of your date time. Date Nights at Home: If you can't get out, at least set aside one evening a week to prioritize your relationship: whenever baby dozes off or doesn't have any immediate needs, spend a little time focusing on each other. Potential conflicts: The main relationship conflicts that come up for new parents are: issues related to sexuality, financial stresses, a feeling of an unfair division of household tasks, and conflicts in parenting styles. The more you can work on these issues before baby arrives, the better!  Fun and Frills (Don't forget these. and don't feel guilty for indulging in them!) Everyone has something in life that is a fun little treat that they do just for themselves. It may be: reading the morning paper, or going for a daily jog, or having coffee with a friend once a week, or going to a movie on Friday nights,  or fine chocolates, or bubble baths, or curling up with a good  book. Unless you do fun things for yourself every now and then, it's hard to have the energy for fun with your baby. Whatever your "special" treats are, make sure you find a way to continue to indulge in them after your baby is born. These special moments can recharge you, and allow you to return to baby with a new joy   PERINATAL MOOD DISORDERS: MATERNAL MENTAL HEALTH FROM CONCEPTION THROUGH THE POSTPARTUM PERIOD   _________________________________________Emergency and Crisis Resources If you are an imminent risk to self or others, are experiencing intense personal distress, and/or have noticed significant changes in activities of daily living, call:  [facility]: [phone]  [address] [place], [place], [address] Mobile Crisis: [phone] National Suicide Hotline: 22 Or visit the following crisis centers: Local Emergency Departments [facility]:  [address], [place]  Mon-Friday 8am-3pm, [phone]                                                                                  ___________ Non-Crisis Resources To identify specific providers that are covered by your insurance, contact your insurance company or local agencies:   Postpartum Support International- Warm-line: 629-532-6019                                                      __Outpatient Therapy and Medication Management   Providers:  [facility]: [phone] Hours: 9AM-5PM  Insurance Accepted: Pilar Jarvis, BCBS, Crista Luria, [place], Medicare  [facility]): [phone] Hours: 8AM-5:30PM  nsurance Accepted: All insurances EXCEPT AARP, [place], [place], and [employer] of the [place]: [phone] Hours: 8AM-8PM Insurance Accepted: Ulla Gallo, Crista Luria, [place], Medicare, [phone] [facility]: [phone] Hours: 8:30AM-7PM Insurance Accepted: Ulla Gallo, Medicaid, Medicare, Tricare, [employer] [facility]:  [phone]   Hours:9AM-5PM Insurance Accepted:  Monia Pouch, Ezequiel Essex, [employer], [place], [employer] [facility]  [facility]: [phone] Hours: 9AM-5:30PM Insurance Accepted: Pilar Jarvis, Teodoro Spray, and Medicaid, Medicare, [facility]:  [phone] Hours: 9am-5pm Insurance Accepted: BCBS; they do not accept Medicaid/Medicare [facility]: [phone] Hours: 9am-9pm Insurance Accepted: All major insurance including Medicaid and Medicare [facility]: [phone] Hours: 9AM- 5PM Insurance Accepted: All insurances EXCEPT Medicaid and Medicare. [facility]: [phone]   ____________                                                                     Parenting Support Groups [facility] at [facility] :  [address], [place], [place], [address] [phone] [facility]:  [phone] Family Support Network: (support for children in the NICU and/or with special needs), [phone]     _____________                                                                                  Online Resources Postpartum Support International: SeekAlumni.co.za  800-944-4PPD Supporting Moms:  www.momssupportingmoms.net

## 2024-02-03 ENCOUNTER — Other Ambulatory Visit: Payer: Self-pay | Admitting: Obstetrics & Gynecology

## 2024-02-03 ENCOUNTER — Ambulatory Visit: Admitting: Women's Health

## 2024-02-03 ENCOUNTER — Ambulatory Visit

## 2024-02-03 ENCOUNTER — Encounter: Payer: Self-pay | Admitting: Women's Health

## 2024-02-03 VITALS — BP 115/70 | HR 73 | Wt 203.4 lb

## 2024-02-03 DIAGNOSIS — Z363 Encounter for antenatal screening for malformations: Secondary | ICD-10-CM

## 2024-02-03 DIAGNOSIS — Z3482 Encounter for supervision of other normal pregnancy, second trimester: Secondary | ICD-10-CM

## 2024-02-03 DIAGNOSIS — Z3A23 23 weeks gestation of pregnancy: Secondary | ICD-10-CM

## 2024-02-03 DIAGNOSIS — Z362 Encounter for other antenatal screening follow-up: Secondary | ICD-10-CM

## 2024-02-03 DIAGNOSIS — Z3480 Encounter for supervision of other normal pregnancy, unspecified trimester: Secondary | ICD-10-CM

## 2024-02-03 DIAGNOSIS — Z3A19 19 weeks gestation of pregnancy: Secondary | ICD-10-CM

## 2024-02-03 NOTE — Progress Notes (Signed)
 LOW-RISK PREGNANCY VISIT Patient name: Melanie Price MRN [id number]  Date of birth: Sep 01, 1990 Chief Complaint:   Routine Prenatal Visit and Pregnancy Ultrasound  History of Present Illness:   Melanie Price is a 33 y.o. [id number] female at [redacted]w[redacted]d with an Estimated Date of Delivery: 05/27/24 being seen today for ongoing management of a low-risk pregnancy.   Today she reports no complaints. Contractions: Not present.  .  Movement: Present. denies leaking of fluid.     11/14/2023   10:49 AM 04/16/2017    9:52 AM 03/05/2017   10:05 AM 11/20/2016   10:34 AM  Depression screen PHQ 2/9  Decreased Interest 1 2 0 3  Down, Depressed, Hopeless 1 1 0 2  PHQ - 2 Score 2 3 0 5  Altered sleeping 1 2  3   Tired, decreased energy 1 3  3   Change in appetite 1 3  3   Feeling bad or failure about yourself  1 1  3   Trouble concentrating 1 1  2   Moving slowly or fidgety/restless 1 0  1  Suicidal thoughts 0 0  1   PHQ-9 Score 8 13  21   Difficult doing work/chores  Extremely dIfficult  Very difficult     Data saved with a previous flowsheet row definition        11/14/2023   10:49 AM  GAD 7 : Generalized Anxiety Score  Nervous, Anxious, on Edge 1  Control/stop worrying 1  Worry too much - different things 1  Trouble relaxing 1  Restless 1  Easily annoyed or irritable 1  Afraid - awful might happen 1  Total GAD 7 Score 7      Review of Systems:   Pertinent items are noted in HPI Denies abnormal vaginal discharge w/ itching/odor/irritation, headaches, visual changes, shortness of breath, chest pain, abdominal pain, severe nausea/vomiting, or problems with urination or bowel movements unless otherwise stated above. Pertinent History Reviewed:  Reviewed past medical,surgical, social, obstetrical and family history.  Reviewed problem list, medications and allergies. Physical Assessment:   Vitals:   02/03/24 0946  BP: 115/70  Pulse: 73  Weight: 203 lb 6.4 oz (92.3 kg)  Body mass index is  30.04 kg/m.        Physical Examination:   General appearance: Well appearing, and in no distress  Mental status: Alert, oriented to person, place, and time  Skin: Warm & dry  Cardiovascular: Normal heart rate noted  Respiratory: Normal respiratory effort, no distress  Abdomen: Soft, gravid, nontender  Pelvic: Cervical exam deferred         Extremities: Edema: None  Fetal Status:     Movement: Present  US  20+5 wks,cephalic,posterior placenta gr 0,normal ovaries,CX 3.7 cm,FHR 142 bpm,SVP of fluid 3.7 cm,EFW 366 g 39%,anatomy of the spine complete,no obvious abnormalities   Chaperone: N/A No results found for this or any previous visit (from the past 24 hours).  Assessment & Plan:  1) Low-risk pregnancy [id number] at [redacted]w[redacted]d with an Estimated Date of Delivery: 05/27/24   2) H/o severe pre-e w/ 35wk IOL, ASA, check bp's, if >140/90 or pre-e s/s, let us  know   Meds: No orders of the defined types were placed in this encounter.  Labs/procedures today: U/S  Plan:  Continue routine obstetrical care  Next visit: prefers will be in person for pn2    Reviewed: Preterm labor symptoms and general obstetric precautions including but not limited to vaginal bleeding, contractions, leaking of fluid and fetal movement were  reviewed in detail with the patient.  All questions were answered. Does have home bp cuff. Office bp cuff given: not applicable. Check bp weekly, let us  know if consistently >140 and/or >90.  Follow-up: Return in about 4 weeks (around 03/02/2024) for LROB, PN2, CNM, in person.  Future Appointments  Date Time Provider Department Center  02/12/2024  3:45 PM [facility] HEALTH CLINICIAN [facility]    No orders of the defined types were placed in this encounter.  Suzen JONELLE Fetters CNM, [facility] 02/03/2024 10:10 AM

## 2024-02-03 NOTE — Progress Notes (Signed)
 US  20+5 wks,cephalic,posterior placenta gr 0,normal ovaries,CX 3.7 cm,FHR 142 bpm,SVP of fluid 3.7 cm,EFW 366 g 39%,anatomy of the spine complete,no obvious abnormalities

## 2024-02-03 NOTE — Patient Instructions (Signed)
 Melanie Price, thank you for choosing our office today! We appreciate the opportunity to meet your healthcare needs. You may receive a short survey by mail, e-mail, or through [employer]. If you are happy with your care we would appreciate if you could take just a few minutes to complete the survey questions. We read all of your comments and take your feedback very seriously. Thank you again for choosing our office.  Center for [employer] Team at [facility]  [facility] at [facility] ([address] [place], [place] [address]) Entrance C, located off of [address] Free 24/7 valet parking   You will have your sugar test next visit.  Please do not eat or drink anything after midnight the night before you come, not even water.  You will be here for at least two hours.  Please make an appointment online for the bloodwork at [email or web address] for 8:00am (or as close to this as possible). Make sure you select the [facility] service center.   CLASSES: Go to [email or web address] to register for classes (childbirth, breastfeeding, waterbirth, infant CPR, daddy bootcamp, etc.)  Call the office [phone]) or go to [facility] if: You begin to have strong, frequent contractions Your water breaks.  Sometimes it is a big gush of fluid, sometimes it is just a trickle that keeps getting your panties wet or running down your legs You have vaginal bleeding.  It is normal to have a small amount of spotting if your cervix was checked.  You don't feel your baby moving like normal.  If you don't, get you something to eat and drink and lay down and focus on feeling your baby move.   If your baby is still not moving like normal, you should call the office or go to [facility].  Call the office [phone]) or go to [facility] hospital for these signs of pre-eclampsia: Severe headache that does not go away with Tylenol Visual changes- seeing spots, double, blurred vision Pain under your right breast or upper  abdomen that does not go away with Tums or heartburn medicine Nausea and/or vomiting Severe swelling in your hands, feet, and face    [facility]/Family Doctors  [facility]): [address] Colette Ribas, [phone]           [facility]: [address], [phone]                [facility] Family [facility]): [address], [phone]  [facility] Department: [address], [place], [phone]    [facility] Pediatricians/Family Doctors [facility]): [address], [phone] [facility]: [address] [place], [phone] [facility]: [address], [phone]  [facility] Doctors  [facility]): [phone] [facility]: [address] [phone]   [facility] Doctors [facility]: [address] [phone]  [facility] Doctors  [facility]: [phone], [phone]  Home Blood Pressure Monitoring for Patients   Your provider has recommended that you check your blood pressure (BP) at least once a week at home. If you do not have a blood pressure cuff at home, one will be provided for you. Contact your provider if you have not received your monitor within 1 week.   Helpful Tips for Accurate Home Blood Pressure Checks  Don't smoke, exercise, or drink caffeine 30 minutes before checking  your BP Use the restroom before checking your BP (a full bladder can raise your pressure) Relax in a comfortable upright chair Feet on the ground Left arm resting comfortably on a flat surface at the level of your heart Legs uncrossed Back supported Sit quietly and don't talk Place the cuff on your bare arm Adjust snuggly, so that only two fingertips can fit between your skin and the top of the cuff Check 2 readings separated by at least one  minute Keep a log of your BP readings For a visual, please reference this diagram: http://ccnc.care/bpdiagram  Provider Name: [facility]     Phone: [phone]  Zone 1: ALL CLEAR  Continue to monitor your symptoms:  BP reading is less than 140 (top number) or less than 90 (bottom number)  No right upper stomach pain No headaches or seeing spots No feeling nauseated or throwing up No swelling in face and hands  Zone 2: CAUTION Call your doctor's office for any of the following:  BP reading is greater than 140 (top number) or greater than 90 (bottom number)  Stomach pain under your ribs in the middle or right side Headaches or seeing spots Feeling nauseated or throwing up Swelling in face and hands  Zone 3: EMERGENCY  Seek immediate medical care if you have any of the following:  BP reading is greater than160 (top number) or greater than 110 (bottom number) Severe headaches not improving with Tylenol Serious difficulty catching your breath Any worsening symptoms from Zone 2   Second Trimester of Pregnancy The second trimester is from week 13 through week 28, months 4 through 6. The second trimester is often a time when you feel your best. Your body has also adjusted to being pregnant, and you begin to feel better physically. Usually, morning sickness has lessened or quit completely, you may have more energy, and you may have an increase in appetite. The second trimester is also a time when the fetus is growing rapidly. At the end of the sixth month, the fetus is about 9 inches long and weighs about 1 pounds. You will likely begin to feel the baby move (quickening) between 18 and 20 weeks of the pregnancy. BODY CHANGES Your body goes through many changes during pregnancy. The changes vary from woman to woman.  Your weight will continue to increase. You will notice your lower abdomen bulging out. You may begin to get stretch marks on your hips, abdomen, and breasts. You may  develop headaches that can be relieved by medicines approved by your health care provider. You may urinate more often because the fetus is pressing on your bladder. You may develop or continue to have heartburn as a result of your pregnancy. You may develop constipation because certain hormones are causing the muscles that push waste through your intestines to slow down. You may develop hemorrhoids or swollen, bulging veins (varicose veins). You may have back pain because of the weight gain and pregnancy hormones relaxing your joints between the bones in your pelvis and as a result of a shift in weight and the muscles that support your balance. Your breasts will continue to grow and be tender. Your gums may bleed and may be sensitive to brushing and flossing. Dark spots or blotches (chloasma, mask of pregnancy) may develop on your face. This will likely fade after the baby is born. A dark line from your belly button to the pubic area (linea nigra) may appear. This will likely fade after the  baby is born. You may have changes in your hair. These can include thickening of your hair, rapid growth, and changes in texture. Some women also have hair loss during or after pregnancy, or hair that feels dry or thin. Your hair will most likely return to normal after your baby is born. WHAT TO EXPECT AT YOUR PRENATAL VISITS During a routine prenatal visit: You will be weighed to make sure you and the fetus are growing normally. Your blood pressure will be taken. Your abdomen will be measured to track your baby's growth. The fetal heartbeat will be listened to. Any test results from the previous visit will be discussed. Your health care provider may ask you: How you are feeling. If you are feeling the baby move. If you have had any abnormal symptoms, such as leaking fluid, bleeding, severe headaches, or abdominal cramping. If you have any questions. Other tests that may be performed during your second  trimester include: Blood tests that check for: Low iron levels (anemia). Gestational diabetes (between 24 and 28 weeks). Rh antibodies. Urine tests to check for infections, diabetes, or protein in the urine. An ultrasound to confirm the proper growth and development of the baby. An amniocentesis to check for possible genetic problems. Fetal screens for spina bifida and Down syndrome. HOME CARE INSTRUCTIONS  Avoid all smoking, herbs, alcohol, and unprescribed drugs. These chemicals affect the formation and growth of the baby. Follow your health care provider's instructions regarding medicine use. There are medicines that are either safe or unsafe to take during pregnancy. Exercise only as directed by your health care provider. Experiencing uterine cramps is a good sign to stop exercising. Continue to eat regular, healthy meals. Wear a good support bra for breast tenderness. Do not use hot tubs, steam rooms, or saunas. Wear your seat belt at all times when driving. Avoid raw meat, uncooked cheese, cat litter boxes, and soil used by cats. These carry germs that can cause birth defects in the baby. Take your prenatal vitamins. Try taking a stool softener (if your health care provider approves) if you develop constipation. Eat more high-fiber foods, such as fresh vegetables or fruit and whole grains. Drink plenty of fluids to keep your urine clear or pale yellow. Take warm sitz baths to soothe any pain or discomfort caused by hemorrhoids. Use hemorrhoid cream if your health care provider approves. If you develop varicose veins, wear support hose. Elevate your feet for 15 minutes, 3-4 times a day. Limit salt in your diet. Avoid heavy lifting, wear low heel shoes, and practice good posture. Rest with your legs elevated if you have leg cramps or low back pain. Visit your dentist if you have not gone yet during your pregnancy. Use a soft toothbrush to brush your teeth and be gentle when you floss. A  sexual relationship may be continued unless your health care provider directs you otherwise. Continue to go to all your prenatal visits as directed by your health care provider. SEEK MEDICAL CARE IF:  You have dizziness. You have mild pelvic cramps, pelvic pressure, or nagging pain in the abdominal area. You have persistent nausea, vomiting, or diarrhea. You have a bad smelling vaginal discharge. You have pain with urination. SEEK IMMEDIATE MEDICAL CARE IF:  You have a fever. You are leaking fluid from your vagina. You have spotting or bleeding from your vagina. You have severe abdominal cramping or pain. You have rapid weight gain or loss. You have shortness of breath with chest pain. You  notice sudden or extreme swelling of your face, hands, ankles, feet, or legs. You have not felt your baby move in over an hour. You have severe headaches that do not go away with medicine. You have vision changes. Document Released: 07/17/2001 Document Revised: 07/28/2013 Document Reviewed: 09/23/2012 [facility] Patient Information 2015 [place], [place]. This information is not intended to replace advice given to you by your health care provider. Make sure you discuss any questions you have with your health care provider.

## 2024-02-18 ENCOUNTER — Encounter: Payer: Self-pay | Admitting: Women's Health

## 2024-02-28 ENCOUNTER — Telehealth: Payer: Self-pay | Admitting: Clinical

## 2024-02-28 NOTE — Telephone Encounter (Signed)
 Attempt to reschedule; Unable to leave message as mailbox is full.

## 2024-03-02 ENCOUNTER — Ambulatory Visit: Admitting: Obstetrics and Gynecology

## 2024-03-02 ENCOUNTER — Other Ambulatory Visit

## 2024-03-02 VITALS — BP 105/68 | HR 88 | Wt 205.0 lb

## 2024-03-02 DIAGNOSIS — F4323 Adjustment disorder with mixed anxiety and depressed mood: Secondary | ICD-10-CM

## 2024-03-02 DIAGNOSIS — Z8759 Personal history of other complications of pregnancy, childbirth and the puerperium: Secondary | ICD-10-CM | POA: Diagnosis not present

## 2024-03-02 DIAGNOSIS — Z3482 Encounter for supervision of other normal pregnancy, second trimester: Secondary | ICD-10-CM

## 2024-03-02 DIAGNOSIS — Z3A27 27 weeks gestation of pregnancy: Secondary | ICD-10-CM

## 2024-03-02 DIAGNOSIS — Z3480 Encounter for supervision of other normal pregnancy, unspecified trimester: Secondary | ICD-10-CM

## 2024-03-02 DIAGNOSIS — Z131 Encounter for screening for diabetes mellitus: Secondary | ICD-10-CM

## 2024-03-02 NOTE — Progress Notes (Unsigned)
   PRENATAL VISIT NOTE  Subjective:  Melanie Price is a 33 y.o. [phone] at [redacted]w[redacted]d being seen today for ongoing prenatal care.  She is currently monitored for the following issues for this low-risk pregnancy and has Kidney stones; Elevated liver enzymes; Fatty liver; History of severe pre-eclampsia; Supervision of other normal pregnancy, antepartum; Depression with anxiety; and Abnormal Pap smear of cervix on their problem list.  Patient reports no complaints.  Contractions: Not present. Vag. Bleeding: None.  Movement: Present. Denies leaking of fluid.   The following portions of the patient's history were reviewed and updated as appropriate: allergies, current medications, past family history, past medical history, past social history, past surgical history and problem list.   Objective:    Vitals:   03/02/24 0956  BP: 105/68  Pulse: 88  Weight: 205 lb (93 kg)    Fetal Status:  Fetal Heart Rate (bpm): 136 Fundal Height: 27 cm Movement: Present    General: Alert, oriented and cooperative. Patient is in no acute distress.  Skin: Skin is warm and dry. No rash noted.   Cardiovascular: Normal heart rate noted  Respiratory: Normal respiratory effort, no problems with respiration noted  Abdomen: Soft, gravid, appropriate for gestational age.  Pain/Pressure: Absent     Pelvic: Cervical exam deferred        Extremities: Normal range of motion.     Mental Status: Normal mood and affect. Normal behavior. Normal judgment and thought content.   Assessment and Plan:  Pregnancy: [id number] at [redacted]w[redacted]d 1. Supervision of other normal pregnancy, antepartum (Primary)   2. Adjustment disorder with mixed anxiety and depressed mood Sees counselor, on effexor 150mg   3. [redacted] weeks gestation of pregnancy Unable to complete PN2 today, rescheduling  Discussed tdap, she would like this next visit  She is considering BTL, discussed consent today for insurance purposes but can change her mind   4. History of  severe pre-eclampsia Normotensive today  Continue ASA  Preterm labor symptoms and general obstetric precautions including but not limited to vaginal bleeding, contractions, leaking of fluid and fetal movement were reviewed in detail with the patient. Please refer to After Visit Summary for other counseling recommendations.    Nidia Daring, FNP

## 2024-03-18 ENCOUNTER — Ambulatory Visit: Admitting: Advanced Practice Midwife

## 2024-03-18 ENCOUNTER — Other Ambulatory Visit

## 2024-03-18 VITALS — BP 111/73 | HR 80 | Wt 211.0 lb

## 2024-03-18 DIAGNOSIS — Z3A30 30 weeks gestation of pregnancy: Secondary | ICD-10-CM

## 2024-03-18 DIAGNOSIS — Z3480 Encounter for supervision of other normal pregnancy, unspecified trimester: Secondary | ICD-10-CM

## 2024-03-18 DIAGNOSIS — Z3A29 29 weeks gestation of pregnancy: Secondary | ICD-10-CM

## 2024-03-18 DIAGNOSIS — Z131 Encounter for screening for diabetes mellitus: Secondary | ICD-10-CM

## 2024-03-18 DIAGNOSIS — Z3483 Encounter for supervision of other normal pregnancy, third trimester: Secondary | ICD-10-CM

## 2024-03-18 NOTE — Progress Notes (Signed)
   LOW-RISK PREGNANCY VISIT Patient name: Melanie Price MRN [id number]  Date of birth: 1991-02-02 Chief Complaint:   Routine Prenatal Visit  History of Present Illness:   Melanie Price is a 33 y.o. [id number] female at [redacted]w[redacted]d with an Estimated Date of Delivery: 05/27/24 being seen today for ongoing management of a low-risk pregnancy.  Today she reports no complaints. Contractions: Not present. Vag. Bleeding: None.  Movement: Present. denies leaking of fluid. Review of Systems:   Pertinent items are noted in HPI Denies abnormal vaginal discharge w/ itching/odor/irritation, headaches, visual changes, shortness of breath, chest pain, abdominal pain, severe nausea/vomiting, or problems with urination or bowel movements unless otherwise stated above. Pertinent History Reviewed:  Reviewed past medical,surgical, social, obstetrical and family history.  Reviewed problem list, medications and allergies. Physical Assessment:   Vitals:   03/18/24 0837  BP: 111/73  Pulse: 80  Weight: 211 lb (95.7 kg)  Body mass index is 31.16 kg/m.        Physical Examination:   General appearance: Well appearing, and in no distress  Mental status: Alert, oriented to person, place, and time  Skin: Warm & dry  Cardiovascular: Normal heart rate noted  Respiratory: Normal respiratory effort, no distress  Abdomen: Soft, gravid, nontender  Pelvic: Cervical exam deferred         Extremities:    Fetal Status: Fetal Heart Rate (bpm): 135 Fundal Height: 29 cm Movement: Present    No results found for this or any previous visit (from the past 24 hours).  Assessment & Plan:  1) Low-risk pregnancy [id number] at [redacted]w[redacted]d with an Estimated Date of Delivery: 05/27/24   2) Hx severe pre-e, BPs stable   Meds: No orders of the defined types were placed in this encounter.  Labs/procedures today: PN2; declines Tdap for today but may want next visit  Plan:  Continue routine obstetrical care   Reviewed: Preterm labor symptoms  and general obstetric precautions including but not limited to vaginal bleeding, contractions, leaking of fluid and fetal movement were reviewed in detail with the patient.  All questions were answered. Has home bp cuff. Check bp weekly, let us  know if >140/90.   Follow-up: Return in about 2 weeks (around 04/01/2024) for LROB (is BTL form under media still good since the form has updated?), in person.  No orders of the defined types were placed in this encounter.  Suzen JONETTA Gentry CNM 03/18/2024 9:01 AM

## 2024-03-18 NOTE — Patient Instructions (Signed)
Betzabeth, thank you for choosing our office today! We appreciate the opportunity to meet your healthcare needs. You may receive a short survey by mail, e-mail, or through MyChart. If you are happy with your care we would appreciate if you could take just a few minutes to complete the survey questions. We read all of your comments and take your feedback very seriously. Thank you again for choosing our office.  [facility] ([address] [place], [place] [address]) Entrance C, located off of [address] Free 24/7 valet parking   CLASSES: Go to [email or web address] to register for classes (childbirth, breastfeeding, waterbirth, infant CPR, daddy bootcamp, etc.)  Call the office ([phone]) or go to [facility] if: You begin to have strong, frequent contractions Your water breaks.  Sometimes it is a big gush of fluid, sometimes it is just a trickle that keeps getting your panties wet or running down your legs You have vaginal bleeding.  It is normal to have a small amount of spotting if your cervix was checked.  You don't feel your baby moving like normal.  If you don't, get you something to eat and drink and lay down and focus on feeling your baby move.   If your baby is still not moving like normal, you should call the office or go to [facility].  Call the office ([phone]) or go to [facility] for these signs of pre-eclampsia: Severe headache that does not go away with Tylenol Visual changes- seeing spots, double, blurred vision Pain under your right breast or upper abdomen that does not go away with Tums or heartburn medicine Nausea and/or vomiting Severe swelling in your hands, feet, and face   Tdap Vaccine It is recommended that you get the Tdap vaccine during the third trimester of EACH pregnancy to help protect your baby from getting pertussis (whooping cough) 27-36 weeks is the BEST time to do  this so that you can pass the protection on to your baby. During pregnancy is better than after pregnancy, but if you are unable to get it during pregnancy it will be offered at the hospital.  You can get this vaccine with us, at the health department, your family doctor, or some local pharmacies Everyone who will be around your baby should also be up-to-date on their vaccines before the baby comes. Adults (who are not pregnant) only need 1 dose of Tdap during adulthood.   [place] [facility] ([facility]): [address], [phone]           [facility]: [address], [phone]                [facility] ([facility]): [address], [phone] (call to ask if accepting patients) [facility]: [address], [place], [phone]    [place] [facility] ([facility]): [address], [phone] [facility]: [address], [phone] [facility]: [address], [phone]  [place] [facility]  [facility] ([facility]): [phone] [facility]: [address], [phone]   [place] [facility]: [address], [phone]  [place] [facility]  [facility]: [address], [phone]  Home Blood Pressure Monitoring for Patients   Your provider has recommended that you check your   blood pressure (BP) at least once a week at home. If you do not have a blood pressure cuff at home, one will be provided for you. Contact your provider if you have not received your monitor within 1 week.   Helpful Tips for Accurate Home Blood Pressure Checks  Don't smoke, exercise, or drink caffeine 30 minutes before checking your BP Use the restroom before checking your BP (a full bladder can raise your  pressure) Relax in a comfortable upright chair Feet on the ground Left arm resting comfortably on a flat surface at the level of your heart Legs uncrossed Back supported Sit quietly and don't talk Place the cuff on your bare arm Adjust snuggly, so that only two fingertips can fit between your skin and the top of the cuff Check 2 readings separated by at least one minute Keep a log of your BP readings For a visual, please reference this diagram: http://ccnc.care/bpdiagram  Provider Name: [facility]     Phone: [phone]  Zone 1: ALL CLEAR  Continue to monitor your symptoms:  BP reading is less than 140 (top number) or less than 90 (bottom number)  No right upper stomach pain No headaches or seeing spots No feeling nauseated or throwing up No swelling in face and hands  Zone 2: CAUTION Call your doctor's office for any of the following:  BP reading is greater than 140 (top number) or greater than 90 (bottom number)  Stomach pain under your ribs in the middle or right side Headaches or seeing spots Feeling nauseated or throwing up Swelling in face and hands  Zone 3: EMERGENCY  Seek immediate medical care if you have any of the following:  BP reading is greater than160 (top number) or greater than 110 (bottom number) Severe headaches not improving with Tylenol Serious difficulty catching your breath Any worsening symptoms from Zone 2   Third Trimester of Pregnancy The third trimester is from week 29 through week 42, months 7 through 9. The third trimester is a time when the fetus is growing rapidly. At the end of the ninth month, the fetus is about 20 inches in length and weighs 6-10 pounds.  BODY CHANGES Your body goes through many changes during pregnancy. The changes vary from woman to woman.  Your weight will continue to increase. You can expect to gain 25-35 pounds (11-16 kg) by the end of the pregnancy. You may begin to get stretch marks on your hips, abdomen,  and breasts. You may urinate more often because the fetus is moving lower into your pelvis and pressing on your bladder. You may develop or continue to have heartburn as a result of your pregnancy. You may develop constipation because certain hormones are causing the muscles that push waste through your intestines to slow down. You may develop hemorrhoids or swollen, bulging veins (varicose veins). You may have pelvic pain because of the weight gain and pregnancy hormones relaxing your joints between the bones in your pelvis. Backaches may result from overexertion of the muscles supporting your posture. You may have changes in your hair. These can include thickening of your hair, rapid growth, and changes in texture. Some women also have hair loss during or after pregnancy, or hair that feels dry or thin. Your hair will most likely return to normal after your baby is born. Your breasts will continue to grow and be tender. A yellow discharge may leak from your breasts called colostrum. Your belly button may stick out. You may   feel short of breath because of your expanding uterus. You may notice the fetus "dropping," or moving lower in your abdomen. You may have a bloody mucus discharge. This usually occurs a few days to a week before labor begins. Your cervix becomes thin and soft (effaced) near your due date. WHAT TO EXPECT AT YOUR PRENATAL EXAMS  You will have prenatal exams every 2 weeks until week 36. Then, you will have weekly prenatal exams. During a routine prenatal visit: You will be weighed to make sure you and the fetus are growing normally. Your blood pressure is taken. Your abdomen will be measured to track your baby's growth. The fetal heartbeat will be listened to. Any test results from the previous visit will be discussed. You may have a cervical check near your due date to see if you have effaced. At around 36 weeks, your caregiver will check your cervix. At the same time, your  caregiver will also perform a test on the secretions of the vaginal tissue. This test is to determine if a type of bacteria, Group B streptococcus, is present. Your caregiver will explain this further. Your caregiver may ask you: What your birth plan is. How you are feeling. If you are feeling the baby move. If you have had any abnormal symptoms, such as leaking fluid, bleeding, severe headaches, or abdominal cramping. If you have any questions. Other tests or screenings that may be performed during your third trimester include: Blood tests that check for low iron levels (anemia). Fetal testing to check the health, activity level, and growth of the fetus. Testing is done if you have certain medical conditions or if there are problems during the pregnancy. FALSE LABOR You may feel small, irregular contractions that eventually go away. These are called Braxton Hicks contractions, or false labor. Contractions may last for hours, days, or even weeks before true labor sets in. If contractions come at regular intervals, intensify, or become painful, it is best to be seen by your caregiver.  SIGNS OF LABOR  Menstrual-like cramps. Contractions that are 5 minutes apart or less. Contractions that start on the top of the uterus and spread down to the lower abdomen and back. A sense of increased pelvic pressure or back pain. A watery or bloody mucus discharge that comes from the vagina. If you have any of these signs before the 37th week of pregnancy, call your caregiver right away. You need to go to the hospital to get checked immediately. HOME CARE INSTRUCTIONS  Avoid all smoking, herbs, alcohol, and unprescribed drugs. These chemicals affect the formation and growth of the baby. Follow your caregiver's instructions regarding medicine use. There are medicines that are either safe or unsafe to take during pregnancy. Exercise only as directed by your caregiver. Experiencing uterine cramps is a good sign to  stop exercising. Continue to eat regular, healthy meals. Wear a good support bra for breast tenderness. Do not use hot tubs, steam rooms, or saunas. Wear your seat belt at all times when driving. Avoid raw meat, uncooked cheese, cat litter boxes, and soil used by cats. These carry germs that can cause birth defects in the baby. Take your prenatal vitamins. Try taking a stool softener (if your caregiver approves) if you develop constipation. Eat more high-fiber foods, such as fresh vegetables or fruit and whole grains. Drink plenty of fluids to keep your urine clear or pale yellow. Take warm sitz baths to soothe any pain or discomfort caused by hemorrhoids. Use hemorrhoid cream if   your caregiver approves. If you develop varicose veins, wear support hose. Elevate your feet for 15 minutes, 3-4 times a day. Limit salt in your diet. Avoid heavy lifting, wear low heal shoes, and practice good posture. Rest a lot with your legs elevated if you have leg cramps or low back pain. Visit your dentist if you have not gone during your pregnancy. Use a soft toothbrush to brush your teeth and be gentle when you floss. A sexual relationship may be continued unless your caregiver directs you otherwise. Do not travel far distances unless it is absolutely necessary and only with the approval of your caregiver. Take prenatal classes to understand, practice, and ask questions about the labor and delivery. Make a trial run to the hospital. Pack your hospital bag. Prepare the baby's nursery. Continue to go to all your prenatal visits as directed by your caregiver. SEEK MEDICAL CARE IF: You are unsure if you are in labor or if your water has broken. You have dizziness. You have mild pelvic cramps, pelvic pressure, or nagging pain in your abdominal area. You have persistent nausea, vomiting, or diarrhea. You have a bad smelling vaginal discharge. You have pain with urination. SEEK IMMEDIATE MEDICAL CARE IF:  You  have a fever. You are leaking fluid from your vagina. You have spotting or bleeding from your vagina. You have severe abdominal cramping or pain. You have rapid weight loss or gain. You have shortness of breath with chest pain. You notice sudden or extreme swelling of your face, hands, ankles, feet, or legs. You have not felt your baby move in over an hour. You have severe headaches that do not go away with medicine. You have vision changes. Document Released: 07/17/2001 Document Revised: 07/28/2013 Document Reviewed: 09/23/2012 Exit[employer] Patient Information 2015 [employer]. This information is not intended to replace advice given to you by your health care provider. Make sure you discuss any questions you have with your health care provider.       

## 2024-03-18 NOTE — Patient Instructions (Signed)
Melanie Price, thank you for choosing our office today! We appreciate the opportunity to meet your healthcare needs. You may receive a short survey by mail, e-mail, or through MyChart. If you are happy with your care we would appreciate if you could take just a few minutes to complete the survey questions. We read all of your comments and take your feedback very seriously. Thank you again for choosing our office.  [facility] ([address] [place], [place] [address]) Entrance C, located off of [address] Free 24/7 valet parking   CLASSES: Go to [email or web address] to register for classes (childbirth, breastfeeding, waterbirth, infant CPR, daddy bootcamp, etc.)  Call the office ([phone]) or go to [facility] if: You begin to have strong, frequent contractions Your water breaks.  Sometimes it is a big gush of fluid, sometimes it is just a trickle that keeps getting your panties wet or running down your legs You have vaginal bleeding.  It is normal to have a small amount of spotting if your cervix was checked.  You don't feel your baby moving like normal.  If you don't, get you something to eat and drink and lay down and focus on feeling your baby move.   If your baby is still not moving like normal, you should call the office or go to [facility].  Call the office ([phone]) or go to [facility] for these signs of pre-eclampsia: Severe headache that does not go away with Tylenol Visual changes- seeing spots, double, blurred vision Pain under your right breast or upper abdomen that does not go away with Tums or heartburn medicine Nausea and/or vomiting Severe swelling in your hands, feet, and face   Tdap Vaccine It is recommended that you get the Tdap vaccine during the third trimester of EACH pregnancy to help protect your baby from getting pertussis (whooping cough) 27-36 weeks is the BEST time to do  this so that you can pass the protection on to your baby. During pregnancy is better than after pregnancy, but if you are unable to get it during pregnancy it will be offered at the hospital.  You can get this vaccine with us, at the health department, your family doctor, or some local pharmacies Everyone who will be around your baby should also be up-to-date on their vaccines before the baby comes. Adults (who are not pregnant) only need 1 dose of Tdap during adulthood.   [place] [facility] Doctors [facility] ([facility]): [address], [phone]           [facility]: [address], [phone]                [facility] ([facility]): [address], [phone] (call to ask if accepting patients) [facility]: [address], [place], [phone]    [place] [facility]/Family Doctors [facility] ([facility]): [address], [phone] [facility]: [address], [phone] [facility]: [address], [phone]  [place] [facility]  [facility] ([facility]): [phone] [facility]: [address], [phone]   [place] [facility]: [address], [phone]  [place] [facility]  [facility]: [address], [phone]  Home Blood Pressure Monitoring for Patients   Your provider has recommended that you check your   blood pressure (BP) at least once a week at home. If you do not have a blood pressure cuff at home, one will be provided for you. Contact your provider if you have not received your monitor within 1 week.   Helpful Tips for Accurate Home Blood Pressure Checks  Don't smoke, exercise, or drink caffeine 30 minutes before checking your BP Use the restroom before checking your BP (a full bladder can raise your  pressure) Relax in a comfortable upright chair Feet on the ground Left arm resting comfortably on a flat surface at the level of your heart Legs uncrossed Back supported Sit quietly and don't talk Place the cuff on your bare arm Adjust snuggly, so that only two fingertips can fit between your skin and the top of the cuff Check 2 readings separated by at least one minute Keep a log of your BP readings For a visual, please reference this diagram: http://ccnc.care/bpdiagram  Provider Name: [facility]     Phone: [phone]  Zone 1: ALL CLEAR  Continue to monitor your symptoms:  BP reading is less than 140 (top number) or less than 90 (bottom number)  No right upper stomach pain No headaches or seeing spots No feeling nauseated or throwing up No swelling in face and hands  Zone 2: CAUTION Call your doctor's office for any of the following:  BP reading is greater than 140 (top number) or greater than 90 (bottom number)  Stomach pain under your ribs in the middle or right side Headaches or seeing spots Feeling nauseated or throwing up Swelling in face and hands  Zone 3: EMERGENCY  Seek immediate medical care if you have any of the following:  BP reading is greater than160 (top number) or greater than 110 (bottom number) Severe headaches not improving with Tylenol Serious difficulty catching your breath Any worsening symptoms from Zone 2   Third Trimester of Pregnancy The third trimester is from week 29 through week 42, months 7 through 9. The third trimester is a time when the fetus is growing rapidly. At the end of the ninth month, the fetus is about 20 inches in length and weighs 6-10 pounds.  BODY CHANGES Your body goes through many changes during pregnancy. The changes vary from woman to woman.  Your weight will continue to increase. You can expect to gain 25-35 pounds (11-16 kg) by the end of the pregnancy. You may begin to get stretch marks on your hips, abdomen,  and breasts. You may urinate more often because the fetus is moving lower into your pelvis and pressing on your bladder. You may develop or continue to have heartburn as a result of your pregnancy. You may develop constipation because certain hormones are causing the muscles that push waste through your intestines to slow down. You may develop hemorrhoids or swollen, bulging veins (varicose veins). You may have pelvic pain because of the weight gain and pregnancy hormones relaxing your joints between the bones in your pelvis. Backaches may result from overexertion of the muscles supporting your posture. You may have changes in your hair. These can include thickening of your hair, rapid growth, and changes in texture. Some women also have hair loss during or after pregnancy, or hair that feels dry or thin. Your hair will most likely return to normal after your baby is born. Your breasts will continue to grow and be tender. A yellow discharge may leak from your breasts called colostrum. Your belly button may stick out. You may   feel short of breath because of your expanding uterus. You may notice the fetus "dropping," or moving lower in your abdomen. You may have a bloody mucus discharge. This usually occurs a few days to a week before labor begins. Your cervix becomes thin and soft (effaced) near your due date. WHAT TO EXPECT AT YOUR PRENATAL EXAMS  You will have prenatal exams every 2 weeks until week 36. Then, you will have weekly prenatal exams. During a routine prenatal visit: You will be weighed to make sure you and the fetus are growing normally. Your blood pressure is taken. Your abdomen will be measured to track your baby's growth. The fetal heartbeat will be listened to. Any test results from the previous visit will be discussed. You may have a cervical check near your due date to see if you have effaced. At around 36 weeks, your caregiver will check your cervix. At the same time, your  caregiver will also perform a test on the secretions of the vaginal tissue. This test is to determine if a type of bacteria, Group B streptococcus, is present. Your caregiver will explain this further. Your caregiver may ask you: What your birth plan is. How you are feeling. If you are feeling the baby move. If you have had any abnormal symptoms, such as leaking fluid, bleeding, severe headaches, or abdominal cramping. If you have any questions. Other tests or screenings that may be performed during your third trimester include: Blood tests that check for low iron levels (anemia). Fetal testing to check the health, activity level, and growth of the fetus. Testing is done if you have certain medical conditions or if there are problems during the pregnancy. FALSE LABOR You may feel small, irregular contractions that eventually go away. These are called Braxton Hicks contractions, or false labor. Contractions may last for hours, days, or even weeks before true labor sets in. If contractions come at regular intervals, intensify, or become painful, it is best to be seen by your caregiver.  SIGNS OF LABOR  Menstrual-like cramps. Contractions that are 5 minutes apart or less. Contractions that start on the top of the uterus and spread down to the lower abdomen and back. A sense of increased pelvic pressure or back pain. A watery or bloody mucus discharge that comes from the vagina. If you have any of these signs before the 37th week of pregnancy, call your caregiver right away. You need to go to the hospital to get checked immediately. HOME CARE INSTRUCTIONS  Avoid all smoking, herbs, alcohol, and unprescribed drugs. These chemicals affect the formation and growth of the baby. Follow your caregiver's instructions regarding medicine use. There are medicines that are either safe or unsafe to take during pregnancy. Exercise only as directed by your caregiver. Experiencing uterine cramps is a good sign to  stop exercising. Continue to eat regular, healthy meals. Wear a good support bra for breast tenderness. Do not use hot tubs, steam rooms, or saunas. Wear your seat belt at all times when driving. Avoid raw meat, uncooked cheese, cat litter boxes, and soil used by cats. These carry germs that can cause birth defects in the baby. Take your prenatal vitamins. Try taking a stool softener (if your caregiver approves) if you develop constipation. Eat more high-fiber foods, such as fresh vegetables or fruit and whole grains. Drink plenty of fluids to keep your urine clear or pale yellow. Take warm sitz baths to soothe any pain or discomfort caused by hemorrhoids. Use hemorrhoid cream if   your caregiver approves. If you develop varicose veins, wear support hose. Elevate your feet for 15 minutes, 3-4 times a day. Limit salt in your diet. Avoid heavy lifting, wear low heal shoes, and practice good posture. Rest a lot with your legs elevated if you have leg cramps or low back pain. Visit your dentist if you have not gone during your pregnancy. Use a soft toothbrush to brush your teeth and be gentle when you floss. A sexual relationship may be continued unless your caregiver directs you otherwise. Do not travel far distances unless it is absolutely necessary and only with the approval of your caregiver. Take prenatal classes to understand, practice, and ask questions about the labor and delivery. Make a trial run to the hospital. Pack your hospital bag. Prepare the baby's nursery. Continue to go to all your prenatal visits as directed by your caregiver. SEEK MEDICAL CARE IF: You are unsure if you are in labor or if your water has broken. You have dizziness. You have mild pelvic cramps, pelvic pressure, or nagging pain in your abdominal area. You have persistent nausea, vomiting, or diarrhea. You have a bad smelling vaginal discharge. You have pain with urination. SEEK IMMEDIATE MEDICAL CARE IF:  You  have a fever. You are leaking fluid from your vagina. You have spotting or bleeding from your vagina. You have severe abdominal cramping or pain. You have rapid weight loss or gain. You have shortness of breath with chest pain. You notice sudden or extreme swelling of your face, hands, ankles, feet, or legs. You have not felt your baby move in over an hour. You have severe headaches that do not go away with medicine. You have vision changes. Document Released: 07/17/2001 Document Revised: 07/28/2013 Document Reviewed: 09/23/2012 ExitCare Patient Information 2015 [employer]. This information is not intended to replace advice given to you by your health care provider. Make sure you discuss any questions you have with your health care provider.       

## 2024-03-20 LAB — CBC
Hematocrit: 33.7 % — ABNORMAL LOW (ref 34.0–46.6)
Hemoglobin: 10.3 g/dL — ABNORMAL LOW (ref 11.1–15.9)
MCH: 25.3 pg — ABNORMAL LOW (ref 26.6–33.0)
MCHC: 30.6 g/dL — ABNORMAL LOW (ref 31.5–35.7)
MCV: 83 fL (ref 79–97)
Platelets: 393 x10E3/uL (ref 150–450)
RBC: 4.07 x10E6/uL (ref 3.77–5.28)
RDW: 15.1 % (ref 11.7–15.4)
WBC: 13.3 x10E3/uL — ABNORMAL HIGH (ref 3.4–10.8)

## 2024-03-20 LAB — RPR: RPR Ser Ql: NONREACTIVE

## 2024-03-20 LAB — GLUCOSE TOLERANCE, 2 HOURS W/ 1HR
Glucose, 1 hour: 117 mg/dL (ref 70–179)
Glucose, 2 hour: 79 mg/dL (ref 70–152)
Glucose, Fasting: 80 mg/dL (ref 70–91)

## 2024-03-20 LAB — AB SCR+ANTIBODY ID

## 2024-03-20 LAB — HIV ANTIBODY (ROUTINE TESTING W REFLEX): HIV Screen 4th Generation wRfx: NONREACTIVE

## 2024-03-20 LAB — ANTIBODY SCREEN

## 2024-03-23 ENCOUNTER — Telehealth: Payer: Self-pay | Admitting: *Deleted

## 2024-03-23 NOTE — Telephone Encounter (Signed)
 Patient called stating the last couple of days she has had dizzy spells when waking up in the morning. States she is trying to drink water throughout the day but is hard due to her job.  Also is trying to eat snacks when she feels hungry.  Encouraged patient to sip fluids throughout the day as she may be slightly dehydrated. Could try adding an iron supplement every other day as her Hgb was 10.3 on 8/13 and could be slightly lower. Advised if symptoms worsen to let us  know.  Pt verbalized understanding and agreeable to plan.

## 2024-03-25 ENCOUNTER — Ambulatory Visit: Payer: Self-pay | Admitting: Advanced Practice Midwife

## 2024-03-25 ENCOUNTER — Other Ambulatory Visit: Payer: Self-pay | Admitting: Advanced Practice Midwife

## 2024-03-25 DIAGNOSIS — D509 Iron deficiency anemia, unspecified: Secondary | ICD-10-CM

## 2024-03-25 MED ORDER — FERRIC MALTOL 30 MG PO CAPS
1.0000 | ORAL_CAPSULE | Freq: Two times a day (BID) | ORAL | 4 refills | Status: AC
Start: 2024-03-25 — End: ?

## 2024-03-30 ENCOUNTER — Telehealth: Payer: Self-pay | Admitting: *Deleted

## 2024-03-30 NOTE — Telephone Encounter (Signed)
 Patient called with c/o abdominal pain that started about 9am and has progressively gotten worse. States the pain is constant but then will ease up starting in the middle of her abdomen then goes down. She has tried drinking water but that does not seem to help. She is starting to have nausea associated with the pain. Denies recent intercourse, bleeding, discharge.  Baby is active.  Currently at work in [place].  Advised patient to go to MAU for evaluation as she did sound uncomfortable on the phone. Pt verbalized understanding and agreeable to plan.

## 2024-04-01 ENCOUNTER — Encounter: Admitting: Advanced Practice Midwife

## 2024-04-02 ENCOUNTER — Encounter: Admitting: Advanced Practice Midwife

## 2024-04-06 ENCOUNTER — Encounter: Payer: Self-pay | Admitting: Obstetrics & Gynecology

## 2024-04-07 ENCOUNTER — Encounter (HOSPITAL_COMMUNITY): Payer: Self-pay | Admitting: Obstetrics and Gynecology

## 2024-04-07 ENCOUNTER — Inpatient Hospital Stay (HOSPITAL_COMMUNITY)
Admission: AD | Admit: 2024-04-07 | Discharge: 2024-04-08 | Disposition: A | Discharge status: Home / Self Care | Attending: Obstetrics and Gynecology | Admitting: Obstetrics and Gynecology

## 2024-04-07 DIAGNOSIS — O26893 Other specified pregnancy related conditions, third trimester: Secondary | ICD-10-CM | POA: Insufficient documentation

## 2024-04-07 DIAGNOSIS — Z3A32 32 weeks gestation of pregnancy: Secondary | ICD-10-CM | POA: Diagnosis not present

## 2024-04-07 DIAGNOSIS — O09293 Supervision of pregnancy with other poor reproductive or obstetric history, third trimester: Secondary | ICD-10-CM | POA: Diagnosis not present

## 2024-04-07 DIAGNOSIS — M542 Cervicalgia: Secondary | ICD-10-CM | POA: Insufficient documentation

## 2024-04-07 DIAGNOSIS — Z8759 Personal history of other complications of pregnancy, childbirth and the puerperium: Secondary | ICD-10-CM

## 2024-04-07 DIAGNOSIS — M7989 Other specified soft tissue disorders: Secondary | ICD-10-CM | POA: Diagnosis not present

## 2024-04-07 DIAGNOSIS — Z3480 Encounter for supervision of other normal pregnancy, unspecified trimester: Secondary | ICD-10-CM

## 2024-04-07 LAB — URINALYSIS, ROUTINE W REFLEX MICROSCOPIC
Bilirubin Urine: NEGATIVE
Glucose, UA: NEGATIVE mg/dL
Hgb urine dipstick: NEGATIVE
Ketones, ur: NEGATIVE mg/dL
Nitrite: NEGATIVE
Protein, ur: 30 mg/dL — AB
Specific Gravity, Urine: 1.024 (ref 1.005–1.030)
WBC, UA: >50 WBC/hpf (ref 0–5)
pH: 6 (ref 5.0–8.0)

## 2024-04-07 LAB — COMPREHENSIVE METABOLIC PANEL WITH GFR
ALT: 26 U/L (ref 0–44)
AST: 52 U/L — ABNORMAL HIGH (ref 15–41)
Albumin: 2.2 g/dL — ABNORMAL LOW (ref 3.5–5.0)
Alkaline Phosphatase: 214 U/L — ABNORMAL HIGH (ref 38–126)
Anion gap: 8 (ref 5–15)
BUN: 16 mg/dL (ref 6–20)
CO2: 21 mmol/L — ABNORMAL LOW (ref 22–32)
Calcium: 8.4 mg/dL — ABNORMAL LOW (ref 8.9–10.3)
Chloride: 103 mmol/L (ref 98–111)
Creatinine, Ser: 0.8 mg/dL (ref 0.44–1.00)
GFR, Estimated: >60 mL/min (ref >60)
Glucose, Bld: 88 mg/dL (ref 70–99)
Potassium: 3.6 mmol/L (ref 3.5–5.1)
Sodium: 132 mmol/L — ABNORMAL LOW (ref 135–145)
Total Bilirubin: 0.2 mg/dL (ref 0.0–1.2)
Total Protein: 5.7 g/dL — ABNORMAL LOW (ref 6.5–8.1)

## 2024-04-07 LAB — CBC
HCT: 29.2 % — ABNORMAL LOW (ref 36.0–46.0)
Hemoglobin: 9.1 g/dL — ABNORMAL LOW (ref 12.0–15.0)
MCH: 24.5 pg — ABNORMAL LOW (ref 26.0–34.0)
MCHC: 31.2 g/dL (ref 30.0–36.0)
MCV: 78.7 fL — ABNORMAL LOW (ref 80.0–100.0)
Platelets: 320 K/uL (ref 150–400)
RBC: 3.71 MIL/uL — ABNORMAL LOW (ref 3.87–5.11)
RDW: 15 % (ref 11.5–15.5)
WBC: 14 K/uL — ABNORMAL HIGH (ref 4.0–10.5)
nRBC: 0.6 % — ABNORMAL HIGH (ref 0.0–0.2)

## 2024-04-07 LAB — PROTEIN / CREATININE RATIO, URINE
Creatinine, Urine: 181 mg/dL
Protein Creatinine Ratio: 0.19 mg/mg{creat} — ABNORMAL HIGH (ref 0.00–0.15)
Total Protein, Urine: 35 mg/dL

## 2024-04-07 MED ORDER — ACETAMINOPHEN 500 MG PO TABS
1000.0000 mg | ORAL_TABLET | Freq: Once | ORAL | Status: AC
  Administered 2024-04-07: 1000 mg via ORAL
  Filled 2024-04-07: qty 2

## 2024-04-07 NOTE — MAU Note (Signed)
 Melanie Price is a 33 y.o. at [redacted]w[redacted]d here in MAU reporting having a lot of swelling out of nowhere. Feet and legs are swollen and that happened before when she had PreE. Usually her b/p is low so no it is high for her. This is what happened when she had PreE with prior pregnancy. Reports scant pink spotting earlier today but has stopped. Some pressure in back of head and neck pain.   LMP: na Onset of complaint: today Pain score: 5 Vitals:   04/07/24 2051 04/07/24 2056  BP:  139/79  Pulse: 81   Resp: 17   Temp: 98.6 F (37 C)   SpO2: 100%      FHT: 150  Lab orders placed from triage: u/a

## 2024-04-07 NOTE — MAU Provider Note (Signed)
 History     CSN: [id number]  Arrival date and time: 04/07/24 2032   Event Date/Time   First Provider Initiated Contact with Patient 04/07/24 2319      Chief Complaint  Patient presents with  . Hypertension   HPI Patient is 33 y.o. [id number] [redacted]w[redacted]d here with complaints of concern for preeclampsia due to swelling.  Patient has a history of severe preeclampsia starting at 35 weeks.  She reports new onset of swelling in her feet and legs and reports this is how her preeclampsia started in the past.  She also reports some mild vaginal bleeding that started earlier today and stopped.  She reports some pressure in the back of her head and pain in her neck.  She denies any changes in her vision or right upper quadrant pain.  +FM, denies LOF, contractions, vaginal discharge.   OB History     Gravida  4   Para  2   Term  1   Preterm  1   AB  1   Living  2      SAB  1   IAB      Ectopic      Multiple  0   Live Births  2           Past Medical History:  Diagnosis Date  . Anemia   . Contraceptive management 09/26/2015  . Depression    post-partum depression  . Fatty liver 05/15/2018   Refer to GI   . Headache    migraine  . Miscarriage 12/21/2015  . Postpartum hypertension   . Preeclampsia   . Preeclampsia     Past Surgical History:  Procedure Laterality Date  . NO PAST SURGERIES      Family History  Problem Relation Age of Onset  . Cancer Maternal Grandfather        skin  . Pyloric stenosis Son   . Irritable bowel syndrome Paternal Aunt   . Crohn's disease Paternal Aunt   . Heart disease Maternal Aunt   . Heart disease Maternal Uncle   . Hepatitis Maternal Uncle        viral    Social History   Tobacco Use  . Smoking status: Never  . Smokeless tobacco: Never  Vaping Use  . Vaping status: Never Used  Substance Use Topics  . Alcohol use: No  . Drug use: No    Allergies:  Allergies  Allergen Reactions  . Vancomycin  Itching and  Dermatitis    Scalp itching, neck and ear erythema.  . Hydroxyzine      Dizzy, difficult to stay awake   . Lexapro  [Escitalopram ]     nausea    Medications Prior to Admission  Medication Sig Dispense Refill Last Dose/Taking  . aspirin  EC 81 MG tablet Take 2 tablets (162 mg total) by mouth daily. Swallow whole. 180 tablet 2 04/07/2024  . Ferric Maltol  30 MG CAPS Take 1 capsule (30 mg total) by mouth 2 (two) times daily. 1 hour before or 2 hours after a meal 60 capsule 4 04/07/2024  . prenatal vitamin w/FE, FA (PRENATAL 1 + 1) 27-1 MG TABS tablet Take 1 tablet by mouth daily 90 tablet 3 04/07/2024  . venlafaxine XR (EFFEXOR-XR) 150 MG 24 hr capsule Take 150 mg by mouth daily. Patient taking 225mg  total   04/07/2024  . Blood Pressure Monitor MISC For regular home bp monitoring during pregnancy 1 each 0   . terconazole  (TERAZOL 7 ) 0.4 % vaginal cream  Place 1 applicator vaginally at bedtime. Use for seven days (Patient not taking: Reported on 02/03/2024) 45 g 0     Review of Systems Physical Exam   Blood pressure 113/60, pulse 67, temperature 98.6 F (37 C), resp. rate 17, height 5' 8 (1.727 m), weight 98.9 kg, last menstrual period 08/21/2023, SpO2 98%. Patient Vitals for the past 24 hrs:  BP Temp Pulse Resp SpO2 Height Weight  04/07/24 2215 113/60 -- 67 -- -- -- --  04/07/24 2200 132/75 -- 66 -- 98 % -- --  04/07/24 2146 135/74 -- 65 -- -- -- --  04/07/24 2131 130/70 -- 66 -- -- -- --  04/07/24 2115 135/77 -- 73 -- 99 % -- --  04/07/24 2056 139/79 -- -- -- -- -- --  04/07/24 2051 -- 98.6 F (37 C) 81 17 100 % 5' 8 (1.727 m) 98.9 kg    Physical Exam  MAU Course  Procedures  MDM ***  Assessment and Plan  ***  Suzen Maryan Masters 04/07/2024, 11:19 PM

## 2024-04-08 ENCOUNTER — Encounter: Payer: Self-pay | Admitting: Obstetrics and Gynecology

## 2024-04-08 ENCOUNTER — Ambulatory Visit (INDEPENDENT_AMBULATORY_CARE_PROVIDER_SITE_OTHER): Payer: Self-pay | Admitting: Obstetrics and Gynecology

## 2024-04-08 ENCOUNTER — Encounter: Payer: Self-pay | Admitting: Obstetrics & Gynecology

## 2024-04-08 VITALS — BP 120/83 | HR 86 | Wt 222.0 lb

## 2024-04-08 DIAGNOSIS — Z3480 Encounter for supervision of other normal pregnancy, unspecified trimester: Secondary | ICD-10-CM

## 2024-04-08 DIAGNOSIS — Z8759 Personal history of other complications of pregnancy, childbirth and the puerperium: Secondary | ICD-10-CM

## 2024-04-08 DIAGNOSIS — Z3A33 33 weeks gestation of pregnancy: Secondary | ICD-10-CM | POA: Diagnosis not present

## 2024-04-08 DIAGNOSIS — Z3A32 32 weeks gestation of pregnancy: Secondary | ICD-10-CM

## 2024-04-08 DIAGNOSIS — D509 Iron deficiency anemia, unspecified: Secondary | ICD-10-CM

## 2024-04-08 DIAGNOSIS — O99011 Anemia complicating pregnancy, first trimester: Secondary | ICD-10-CM

## 2024-04-08 LAB — POCT URINALYSIS DIPSTICK OB
Blood, UA: NEGATIVE
Glucose, UA: NEGATIVE
Ketones, UA: NEGATIVE
Nitrite, UA: NEGATIVE

## 2024-04-08 MED ORDER — CYCLOBENZAPRINE HCL 10 MG PO TABS: 10.0000 mg | ORAL_TABLET | Freq: Three times a day (TID) | ORAL | 0 refills | Status: AC | PRN

## 2024-04-08 NOTE — Progress Notes (Unsigned)
   PRENATAL VISIT NOTE  Subjective:  Melanie Price is a 33 y.o. [phone] at [redacted]w[redacted]d being seen today for ongoing prenatal care.  She is currently monitored for the following issues for this {Blank single:19197::high-risk,low-risk} pregnancy and has Kidney stones; Elevated liver enzymes; Fatty liver; History of severe pre-eclampsia; Supervision of other normal pregnancy, antepartum; Depression with anxiety; and Abnormal Pap smear of cervix on their problem list.  Patient reports pressure in headache, increase in swelling on feet throughout .  Contractions: Not present. Vag. Bleeding: None.  Movement: Present. Denies leaking of fluid.   The following portions of the patient's history were reviewed and updated as appropriate: allergies, current medications, past family history, past medical history, past social history, past surgical history and problem list.   Objective:    Vitals:   04/08/24 1012  BP: 120/83  Pulse: 86  Weight: 222 lb (100.7 kg)    Fetal Status:      Movement: Present    General: Alert, oriented and cooperative. Patient is in no acute distress.  Skin: Skin is warm and dry. No rash noted.   Cardiovascular: Normal heart rate noted  Respiratory: Normal respiratory effort, no problems with respiration noted  Abdomen: Soft, gravid, appropriate for gestational age.  Pain/Pressure: Present     Pelvic: {Blank single:19197::Cervical exam performed in the presence of a chaperone,Cervical exam deferred}        Extremities: Normal range of motion.     Mental Status: Normal mood and affect. Normal behavior. Normal judgment and thought content.   Assessment and Plan:  Pregnancy: [id number] at [redacted]w[redacted]d 1. History of pre-eclampsia (Primary) *** - POC Urinalysis Dipstick OB  2. [redacted] weeks gestation of pregnancy *** - POC Urinalysis Dipstick OB  {Blank single:19197::Term,Preterm} labor symptoms and general obstetric precautions including but not limited to vaginal bleeding,  contractions, leaking of fluid and fetal movement were reviewed in detail with the patient. Please refer to After Visit Summary for other counseling recommendations.   No follow-ups on file.  Future Appointments  Date Time Provider Department Center  04/15/2024  2:10 PM Loreli Suzen BIRCH, CNM [facility]  04/29/2024  9:50 AM Kizzie Suzen SAUNDERS, CNM [facility]  05/06/2024  9:50 AM Loreli Suzen BIRCH, CNM [facility]  05/13/2024  9:50 AM Kizzie Suzen SAUNDERS, CNM [facility]  05/20/2024  9:50 AM Loreli Suzen BIRCH, CNM [facility]  05/27/2024  9:50 AM Kizzie Suzen SAUNDERS, CNM [facility]    Nidia Daring, FNP

## 2024-04-09 ENCOUNTER — Encounter: Payer: Self-pay | Admitting: Obstetrics & Gynecology

## 2024-04-13 ENCOUNTER — Ambulatory Visit (INDEPENDENT_AMBULATORY_CARE_PROVIDER_SITE_OTHER): Admitting: *Deleted

## 2024-04-13 VITALS — BP 145/95

## 2024-04-13 DIAGNOSIS — Z3480 Encounter for supervision of other normal pregnancy, unspecified trimester: Secondary | ICD-10-CM

## 2024-04-13 DIAGNOSIS — R030 Elevated blood-pressure reading, without diagnosis of hypertension: Secondary | ICD-10-CM

## 2024-04-13 DIAGNOSIS — Z3A33 33 weeks gestation of pregnancy: Secondary | ICD-10-CM | POA: Diagnosis not present

## 2024-04-13 LAB — POCT URINALYSIS DIPSTICK OB
Blood, UA: NEGATIVE
Glucose, UA: NEGATIVE
Ketones, UA: NEGATIVE
Leukocytes, UA: NEGATIVE
Nitrite, UA: NEGATIVE

## 2024-04-13 NOTE — Progress Notes (Signed)
   NURSE VISIT- BLOOD PRESSURE CHECK  SUBJECTIVE:  Melanie Price is a 33 y.o. [phone] female here for BP check. She is [redacted]w[redacted]d pregnant . Reports elevated bp readings at home. Ranges 141-150/90's. Continues to have pressure in the back of her head and floaters on/off.  HYPERTENSION ROS:  Pregnant:  Severe headaches that don't go away with tylenol /other medicines: Yes  Visual changes (seeing spots/double/blurred vision) Yes but not a new symptom Severe pain under right breast breast or in center of upper chest No  Severe nausea/vomiting No  Taking medicines as instructed not applicable  OBJECTIVE:  BP (!) 145/95   LMP 08/21/2023   Appearance alert, well appearing, and in no distress.  ASSESSMENT: Melanie Price [redacted]w[redacted]d  blood pressure check NST Baseline 130, Accels: present, Decels:absent, Toco:none  PLAN: Discussed with Dr. Ozan   Recommendations: check pre-e labs today   Follow-up: as scheduled   Lissette Schenk  04/13/2024 4:52 PM

## 2024-04-14 ENCOUNTER — Encounter (HOSPITAL_COMMUNITY): Payer: Self-pay | Admitting: Obstetrics and Gynecology

## 2024-04-14 ENCOUNTER — Inpatient Hospital Stay (HOSPITAL_COMMUNITY)
Admission: AD | Admit: 2024-04-14 | Discharge: 2024-04-15 | Disposition: A | Discharge status: Other Acute Inpatient Hospital | Payer: Self-pay | Attending: Obstetrics and Gynecology | Admitting: Obstetrics and Gynecology

## 2024-04-14 ENCOUNTER — Ambulatory Visit: Payer: Self-pay | Admitting: Women's Health

## 2024-04-14 ENCOUNTER — Telehealth: Payer: Self-pay | Admitting: *Deleted

## 2024-04-14 DIAGNOSIS — Z3A33 33 weeks gestation of pregnancy: Secondary | ICD-10-CM

## 2024-04-14 DIAGNOSIS — R519 Headache, unspecified: Secondary | ICD-10-CM

## 2024-04-14 DIAGNOSIS — O09293 Supervision of pregnancy with other poor reproductive or obstetric history, third trimester: Secondary | ICD-10-CM | POA: Insufficient documentation

## 2024-04-14 DIAGNOSIS — O26893 Other specified pregnancy related conditions, third trimester: Secondary | ICD-10-CM

## 2024-04-14 DIAGNOSIS — O1413 Severe pre-eclampsia, third trimester: Secondary | ICD-10-CM

## 2024-04-14 DIAGNOSIS — Z3689 Encounter for other specified antenatal screening: Secondary | ICD-10-CM

## 2024-04-14 DIAGNOSIS — O1493 Unspecified pre-eclampsia, third trimester: Secondary | ICD-10-CM

## 2024-04-14 LAB — COMPREHENSIVE METABOLIC PANEL WITH GFR
ALT: 19 IU/L (ref 0–32)
ALT: 22 U/L (ref 0–44)
AST: 51 IU/L — ABNORMAL HIGH (ref 0–40)
AST: 53 U/L — ABNORMAL HIGH (ref 15–41)
Albumin: 3.2 g/dL — ABNORMAL LOW (ref 3.9–4.9)
Albumin: 2.2 g/dL — ABNORMAL LOW (ref 3.5–5.0)
Alkaline Phosphatase: 261 IU/L — ABNORMAL HIGH (ref 44–121)
Alkaline Phosphatase: 196 U/L — ABNORMAL HIGH (ref 38–126)
Anion gap: 12 (ref 5–15)
BUN/Creatinine Ratio: 19 (ref 9–23)
BUN: 16 mg/dL (ref 6–20)
BUN: 13 mg/dL (ref 6–20)
Bilirubin Total: <0.2 mg/dL (ref 0.0–1.2)
CO2: 19 mmol/L — ABNORMAL LOW (ref 20–29)
CO2: 19 mmol/L — ABNORMAL LOW (ref 22–32)
Calcium: 8.7 mg/dL (ref 8.7–10.2)
Calcium: 7.9 mg/dL — ABNORMAL LOW (ref 8.9–10.3)
Chloride: 101 mmol/L (ref 96–106)
Chloride: 103 mmol/L (ref 98–111)
Creatinine, Ser: 0.85 mg/dL (ref 0.57–1.00)
Creatinine, Ser: 0.85 mg/dL (ref 0.44–1.00)
GFR, Estimated: >60 mL/min (ref >60)
Globulin, Total: 2.6 g/dL (ref 1.5–4.5)
Glucose, Bld: 77 mg/dL (ref 70–99)
Glucose: 73 mg/dL (ref 70–99)
Potassium: 4.4 mmol/L (ref 3.5–5.2)
Potassium: 4 mmol/L (ref 3.5–5.1)
Sodium: 134 mmol/L (ref 134–144)
Sodium: 134 mmol/L — ABNORMAL LOW (ref 135–145)
Total Bilirubin: 0.4 mg/dL (ref 0.0–1.2)
Total Protein: 5.8 g/dL — ABNORMAL LOW (ref 6.0–8.5)
Total Protein: 5.8 g/dL — ABNORMAL LOW (ref 6.5–8.1)
eGFR: 93 mL/min/1.73 (ref >59)

## 2024-04-14 LAB — PROTEIN / CREATININE RATIO, URINE
Creatinine, Urine: 221.8 mg/dL
Creatinine, Urine: 164 mg/dL
Protein Creatinine Ratio: 0.63 mg/mg{creat} — ABNORMAL HIGH (ref 0.00–0.15)
Protein, Ur: 90.7 mg/dL
Protein/Creat Ratio: 409 mg/g{creat} — ABNORMAL HIGH (ref 0–200)
Total Protein, Urine: 104 mg/dL

## 2024-04-14 LAB — CBC
HCT: 29 % — ABNORMAL LOW (ref 36.0–46.0)
Hematocrit: 30.6 % — ABNORMAL LOW (ref 34.0–46.6)
Hemoglobin: 9.5 g/dL — ABNORMAL LOW (ref 11.1–15.9)
Hemoglobin: 9 g/dL — ABNORMAL LOW (ref 12.0–15.0)
MCH: 24.6 pg — ABNORMAL LOW (ref 26.6–33.0)
MCH: 24.2 pg — ABNORMAL LOW (ref 26.0–34.0)
MCHC: 31 g/dL — ABNORMAL LOW (ref 31.5–35.7)
MCHC: 31 g/dL (ref 30.0–36.0)
MCV: 79 fL (ref 79–97)
MCV: 78 fL — ABNORMAL LOW (ref 80.0–100.0)
NRBC: 1 % — ABNORMAL HIGH (ref 0–0)
Platelets: 264 x10E3/uL (ref 150–450)
Platelets: 267 K/uL (ref 150–400)
RBC: 3.86 x10E6/uL (ref 3.77–5.28)
RBC: 3.72 MIL/uL — ABNORMAL LOW (ref 3.87–5.11)
RDW: 14.8 % (ref 11.7–15.4)
RDW: 15.4 % (ref 11.5–15.5)
WBC: 14.8 x10E3/uL — ABNORMAL HIGH (ref 3.4–10.8)
WBC: 14.2 K/uL — ABNORMAL HIGH (ref 4.0–10.5)
nRBC: 0.7 % — ABNORMAL HIGH (ref 0.0–0.2)

## 2024-04-14 MED ORDER — MAGNESIUM SULFATE 40 GM/1000ML IV SOLN
2.0000 g/h | INTRAVENOUS | Status: DC
  Administered 2024-04-14: 2 g/h via INTRAVENOUS
  Filled 2024-04-14: qty 1000

## 2024-04-14 MED ORDER — BETAMETHASONE SOD PHOS & ACET 6 (3-3) MG/ML IJ SUSP
12.0000 mg | Freq: Once | INTRAMUSCULAR | Status: AC
  Administered 2024-04-14: 12 mg via INTRAMUSCULAR
  Filled 2024-04-14: qty 5

## 2024-04-14 MED ORDER — LACTATED RINGERS IV SOLN: INTRAVENOUS | Status: DC

## 2024-04-14 MED ORDER — MAGNESIUM SULFATE BOLUS VIA INFUSION
4.0000 g | Freq: Once | INTRAVENOUS | Status: AC
  Administered 2024-04-14: 4 g via INTRAVENOUS
  Filled 2024-04-14: qty 1000

## 2024-04-14 MED ORDER — ACETAMINOPHEN-CAFFEINE 500-65 MG PO TABS
2.0000 | ORAL_TABLET | Freq: Once | ORAL | Status: AC
  Administered 2024-04-14: 2 via ORAL
  Filled 2024-04-14: qty 2

## 2024-04-14 MED ORDER — HYDROXYZINE HCL 50 MG PO TABS
25.0000 mg | ORAL_TABLET | Freq: Once | ORAL | Status: AC
  Administered 2024-04-14: 25 mg via ORAL
  Filled 2024-04-14 (×2): qty 1

## 2024-04-14 NOTE — MAU Provider Note (Signed)
 Chief Complaint:  Headache and Hypertension   HPI   Melanie Price is a 33 y.o. [id number] at [redacted]w[redacted]d who presents to maternity admissions for elevated blood pressures and headache. Pain in back of head and down neck for 2 weeks, worsening, described as pressure. Has been prescribed flexeril  10 mg q8h prn for back of head and neck pressure, not helping much. Had previously tried tylenol , aspirin ; didn't help. Also with flashes of light in vision and with patches of darkness. Symptoms worsened today and are keeping her from sleeping. Also reports some chest pressure. Occasional shortness of breath associated with chest pressure. Occasional fluttering, happens randomly, none today. Had epigastric pain ~5 days ago, resolved a few days ago. Recently seen 9/8 in outpatient clinic with elevated protein/Cr ratio of 0.4.  Contractions: None  Vaginal bleeding: Some spotting a week ago, none since  Fluid leakage: None  Fetal movement: Yes  Pregnancy Course: Receives care at [facility] . Prenatal records reviewed, has preeclampsia in this pregnancy. Hx of severe preeclampsia in prior pregnancy. Hx fatty liver, elevated liver enzymes.  Past Medical History:  Diagnosis Date   Anemia    Contraceptive management 09/26/2015   Depression    post-partum depression   Fatty liver 05/15/2018   Refer to GI    Headache    migraine   Miscarriage 12/21/2015   Postpartum hypertension    Preeclampsia    Preeclampsia    OB History  Gravida Para Term Preterm AB Living  4 2 1 1 1 2   SAB IAB Ectopic Multiple Live Births  1   0 2    # Outcome Date GA Lbr Len/2nd Weight Sex Type Anes PTL Lv  4 Current           3 Preterm 10/19/17 [redacted]w[redacted]d  2410 g F Vag-Spont EPI  LIV     Complications: Severe pre-eclampsia  2 SAB 11/2015 [redacted]w[redacted]d         1 Term 06/17/14 [redacted]w[redacted]d / 01:36 3250 g M Vag-Spont EPI N LIV     Birth Comments: none   Past Surgical History:  Procedure Laterality Date   NO PAST  SURGERIES     Family History  Problem Relation Age of Onset   Cancer Maternal Grandfather        skin   Pyloric stenosis Son    Irritable bowel syndrome Paternal Aunt    Crohn's disease Paternal Aunt    Heart disease Maternal Aunt    Heart disease Maternal Uncle    Hepatitis Maternal Uncle        viral   Social History   Tobacco Use   Smoking status: Never   Smokeless tobacco: Never  Vaping Use   Vaping status: Never Used  Substance Use Topics   Alcohol use: No   Drug use: No   Allergies  Allergen Reactions   Vancomycin  Itching and Dermatitis    Scalp itching, neck and ear erythema.   Hydroxyzine      Dizzy, difficult to stay awake    Lexapro  [Escitalopram ]     nausea   No medications prior to admission.   I have reviewed patient's Past Medical Hx, Surgical Hx, Family Hx, Social Hx, medications and allergies.   ROS  Pertinent items noted in HPI and remainder of comprehensive ROS otherwise negative.   PHYSICAL EXAM  Patient Vitals for the past 24 hrs:  BP Temp Temp src Pulse Resp SpO2 Height Weight  04/14/24 2351 127/68 -- --  77 -- -- -- --  04/14/24 2346 121/69 98.2 F (36.8 C) -- 85 18 97 % -- --  04/14/24 2332 136/74 -- -- 75 -- 97 % -- --  04/14/24 2322 132/82 98.2 F (36.8 C) -- 68 18 -- -- --  04/14/24 2320 -- -- -- -- -- 99 % -- --  04/14/24 2246 (!) 141/94 -- -- 69 -- -- -- --  04/14/24 2216 (!) 142/88 -- -- 78 -- -- -- --  04/14/24 2205 (!) 151/93 -- -- 71 -- -- -- --  04/14/24 2101 127/78 -- -- 77 -- -- -- --  04/14/24 2046 113/66 -- -- 65 -- -- -- --  04/14/24 2031 123/63 -- -- 72 -- -- -- --  04/14/24 2016 122/83 -- -- 64 -- -- -- --  04/14/24 2004 129/68 -- -- 61 -- -- -- --  04/14/24 1946 (!) 135/91 -- -- 61 -- -- -- --  04/14/24 1931 (!) 154/83 -- -- 66 -- -- -- --  04/14/24 1916 (!) 140/85 -- -- 67 -- -- -- --  04/14/24 1901 133/72 -- -- 71 -- -- -- --  04/14/24 1855 (!) 142/88 -- -- 63 -- -- -- --  04/14/24 1834 (!) 138/97 98.5 F (36.9  C) Oral 66 18 100 % 5' 8 (1.727 m) 99.7 kg    Constitutional: Well-developed, well-nourished female in no acute distress.  HEENT: atraumatic, normocephalic. Neck has normal ROM. Tenderness to palpation of posterior right neck. EOM intact. PERRL bilaterally. Cardiovascular: normal rate & rhythm, warm and well-perfused Respiratory: normal effort, no problems with respiration noted GI: Abd soft, non-tender, non-distended MSK: Extremities nontender, no edema, normal ROM Skin: warm and dry. Acyanotic, no jaundice or pallor. Neurologic: Alert and oriented x 4. No abnormal coordination. Psychiatric: Normal mood. Speech not slurred, not rapid/pressured. Patient is cooperative.  Fetal Tracing: Baseline FHR: 150 per minute Fetal heart variability: moderate Fetal Heart Rate accelerations: yes Fetal Heart Rate decelerations: none Fetal Non-stress Test: Category I (reactive) Toco: Occasional ctx  Labs: Results for orders placed or performed during the hospital encounter of 04/14/24 (from the past 24 hours)  Protein / creatinine ratio, urine     Status: Abnormal   Collection Time: 04/14/24  7:03 PM  Result Value Ref Range   Creatinine, Urine 164 mg/dL   Total Protein, Urine 104 mg/dL   Protein Creatinine Ratio 0.63 (H) 0.00 - 0.15 mg/mg[Cre]  Comprehensive metabolic panel     Status: Abnormal   Collection Time: 04/14/24  7:22 PM  Result Value Ref Range   Sodium 134 (L) 135 - 145 mmol/L   Potassium 4.0 3.5 - 5.1 mmol/L   Chloride 103 98 - 111 mmol/L   CO2 19 (L) 22 - 32 mmol/L   Glucose, Bld 77 70 - 99 mg/dL   BUN 13 6 - 20 mg/dL   Creatinine, Ser 9.14 0.44 - 1.00 mg/dL   Calcium  7.9 (L) 8.9 - 10.3 mg/dL   Total Protein 5.8 (L) 6.5 - 8.1 g/dL   Albumin 2.2 (L) 3.5 - 5.0 g/dL   AST 53 (H) 15 - 41 U/L   ALT 22 0 - 44 U/L   Alkaline Phosphatase 196 (H) 38 - 126 U/L   Total Bilirubin 0.4 0.0 - 1.2 mg/dL   GFR, Estimated >39 >39 mL/min   Anion gap 12 5 - 15  CBC     Status: Abnormal    Collection Time: 04/14/24  7:22 PM  Result Value Ref Range  WBC 14.2 (H) 4.0 - 10.5 K/uL   RBC 3.72 (L) 3.87 - 5.11 MIL/uL   Hemoglobin 9.0 (L) 12.0 - 15.0 g/dL   HCT 70.9 (L) 63.9 - 53.9 %   MCV 78.0 (L) 80.0 - 100.0 fL   MCH 24.2 (L) 26.0 - 34.0 pg   MCHC 31.0 30.0 - 36.0 g/dL   RDW 84.5 88.4 - 84.4 %   Platelets 267 150 - 400 K/uL   nRBC 0.7 (H) 0.0 - 0.2 %    Imaging:  No results found.   MDM & MAU COURSE  MDM: High  MAU Course: Differential diagnosis considered for headache includes but is not limited to: preeclampsia, tension headache, cluster, trauma, concussion, migraine, CVA/SAH, viral syndrome, acute angle closure glaucoma, CO toxicity, temporal arteritis, meningitis, methanol use   PreE labs ordered. CBC with normal platelets, stable anemia. UPC shows worsening proteinuria.  Treated with 2 tablets of Excedrin Tension.  9:43 PM HA is mildly improved but still having scotomata.  US  to confirm vertex performed.   [id number] Melanie Price 03/22/1991  Patient informed that the ultrasound is considered a limited OB ultrasound and is not intended to be a complete ultrasound exam.  Patient also informed that the ultrasound is not being completed with the intent of assessing for fetal or placental anomalies or any pelvic abnormalities.  Explained that the purpose of today's ultrasound is to assess for  presentation.  Patient acknowledges the purpose of the exam and the limitations of the study.  9:53 PM Discussed with Dr Loreda. NICU is closed.  9:55 PM Discussed with [facility], their NICU is closed  9:55 PM called [facility] to facilitate transfer of patient. Spoke with Dr. Camie Pizza about Transfer for NICU available. Patient to go to LD.   Orders Placed This Encounter  Procedures   Protein / creatinine ratio, urine   Comprehensive metabolic panel   CBC   Strict intake and output   Insert peripheral IV   Discharge patient Discharge disposition: 95-DC/txfr to  another health care institution with planned acute care hosp IP readmit; Discharge patient date: 04/15/2024   Meds ordered this encounter  Medications   acetaminophen -caffeine  (EXCEDRIN TENSION HEADACHE) 500-65 MG per tablet 2 tablet   magnesium  bolus via infusion 4 g   magnesium  sulfate 40 grams in SWI 1000 mL OB infusion   lactated ringers  infusion   betamethasone  acetate-betamethasone  sodium phosphate  (CELESTONE ) injection 12 mg   hydrOXYzine  (ATARAX ) tablet 25 mg    ASSESSMENT   1. Severe pre-eclampsia in third trimester   2. Pre-eclampsia in third trimester   3. Pregnancy headache in third trimester   4. [redacted] weeks gestation of pregnancy   5. NST (non-stress test) reactive     PLAN   Transfer to [facility] for IOL for [facility] with severe features. Magnesium  started BMZ#1 given  Allergies as of 04/15/2024       Reactions   Vancomycin  Itching, Dermatitis   Scalp itching, neck and ear erythema.   Hydroxyzine     Dizzy, difficult to stay awake   Lexapro  [escitalopram ]    nausea        Medication List     TAKE these medications    aspirin  EC 81 MG tablet Take 2 tablets (162 mg total) by mouth daily. Swallow whole.   Blood Pressure Monitor Misc For regular home bp monitoring during pregnancy   cyclobenzaprine  10 MG tablet Commonly known as: FLEXERIL  Take 1 tablet (10 mg total) by  mouth every 8 (eight) hours as needed for muscle spasms.   Ferric Maltol  30 MG Caps Take 1 capsule (30 mg total) by mouth 2 (two) times daily. 1 hour before or 2 hours after a meal   prenatal vitamin w/FE, FA 27-1 MG Tabs tablet Take 1 tablet by mouth daily   terconazole  0.4 % vaginal cream Commonly known as: TERAZOL 7  Place 1 applicator vaginally at bedtime. Use for seven days   venlafaxine XR 150 MG 24 hr capsule Commonly known as: EFFEXOR-XR Take 150 mg by mouth daily. Patient taking 225mg  total       Alan Flies, MD - Family Medicine Resident  Attestation of  Attending Supervision of Resident: Evaluation and management procedures were performed by the [facility] Medicine Resident under my supervision.  I have reviewed the Resident's note and chart, and I agree with the management and plan. I personally interpreted the labs and made the decision for delivery. I also arranged EMTALA transfer.   Suzen Maryan Masters, MD, MPH, ABFM Attending Physician Faculty Practice- [facility]       Attending Addendum Patient re-evaluated prior to transfer, stable and appropriate for transfer to [facility] for delivery tomorrow at 34 wks due to severe pre-eclampsia. EMTALA form completed.    Donnice CHRISTELLA Carolus, MD, MPH, FAAFP Attending Family Medicine Physician, [facility] for [facility], [facility]  12:23 AM 04/15/24

## 2024-04-14 NOTE — Discharge Instructions (Addendum)
 Melanie Price,  You came to the MAU (Maternity Assessment Unit) today for elevated blood pressures and head pressure. You were confirmed to have preeclampsia based on prior labs done with your outpatient prenatal provider. We repeated labs on you today, which showed **. We treated your headache with 2 tablets of Excedrin Tension.   You can continue to use tylenol  or Excedrin Tension as needed for headache; Excedrin has tylenol  in it, so only use one at a time. Make sure to avoid aspirin  and other NSAIDs in your third trimester of pregnancy.  Reasons to return to the MAU: - You have a worsening headache not improved by using pain medication, especially if your blood pressure is also elevated. - You start to experience abdominal pain/contractions (squeezing sensation) - You experience vaginal bleeding or leakage of fluids - You do not feel your baby moving as much or at all - You develop a fever (> 100.61F or 38C)  Thank you for allowing me to be a part of your care! Alan Flies, MD [facility] Family Medicine, PGY1 [facility] at [facility] [address] [place], [place] [address]

## 2024-04-14 NOTE — Telephone Encounter (Signed)
 Patient called stating her headache seemed to be worse and just not feeling well.  Has not been able to check her bp today as her cuff needs new batteries.  Discussed with Luke Fetters, CNM and based on worsening symptoms and recent labs, advised to go to MAU. Pt verbalized understanding and agreeable to go.

## 2024-04-14 NOTE — MAU Note (Signed)
 Melanie Price is a 33 y.o. at [redacted]w[redacted]d here in MAU reporting: called with results, dx with pre-eclampsia.  Told them she was having HA, pressure in her head and neck area and BP has been elevated.  They told her to come here.   Onset of complaint: ongoing Pain score: 6-hasn't taken anything Vitals:   04/14/24 1834  BP: (!) 138/97  Pulse: 66  Resp: 18  Temp: 98.5 F (36.9 C)  SpO2: 100%     FHT:130 Lab orders placed from triage:  urine collected

## 2024-04-15 ENCOUNTER — Encounter: Payer: Self-pay | Admitting: Advanced Practice Midwife

## 2024-04-15 DIAGNOSIS — Z743 Need for continuous supervision: Secondary | ICD-10-CM | POA: Diagnosis not present

## 2024-04-15 DIAGNOSIS — O159 Eclampsia, unspecified as to time period: Secondary | ICD-10-CM | POA: Diagnosis not present

## 2024-04-15 NOTE — Anesthesia Preprocedure Evaluation (Addendum)
 Patient: Melanie Price  Procedure Information   Date: 04/15/24 Procedure: Labor Analgesia     Relevant Problems  No relevant active problems    There were no vitals taken for this visit.   Clinical information reviewed:  Allergies  Meds  Surg Hx      Anesthesia Evaluation  Anesthesia History: Patient has no history of anesthetic complications.  PONV Predictive Score (Scale 0-5):  Apfel risk score: 0 Respiratory: negative pulmonary ROS.  Cardiovascular: Patient has high blood pressure. Patient has no angina/chest pain, no CAD, no dysrhythmias and no pacemaker/AICD.  HEENT: negative HEENT ROS.  Neurological: negative neuro ROS. Patient has headaches (migraines). Patient has no CVA and no seizures.  Renal/Gastrointestinal: negative GI/hepatic ROS. Patient has no GERD, no nausea, no vomiting, no hepatitis and no cirrhosis.  Genitourinary: negative renal ROS.  Endocrine: negative endocrine ROS.  Hematology/Oncology: negative hematology/oncology ROS.  Psych: Patient has psychiatric history.     Physical Exam  Airway  Mallampati: III TM Distance (FB): 3 Oral Aperture (FB): 3 Neck ROM: full ROM Cardiovascular - normal exam Dental - normal exam Pulmonary - normal exam   Anesthesia Plan  Review Preop documentation reviewed: H&P reviewed, Surgeon's Note Reviewed, NPO Status Reviewed, Preop Vitals Reviewed, Previous Anesthesia Records Reviewed and Periop Tests and Results Reviewed Comments: 33yo G4P2 transferred from OSH for PreE with SF.  Plan ASA score: 2  Anesthesia type: regional Induction: epidural and spinal Post-op plan: Labor/Post Partum Care  Informed Consent Anesthetic plan and risks discussed with patient. Use of blood products discussed with patient whom consented to blood products. Plan discussed with attending.

## 2024-04-28 ENCOUNTER — Encounter: Payer: Self-pay | Admitting: Obstetrics & Gynecology

## 2024-04-29 ENCOUNTER — Encounter: Admitting: Women's Health

## 2024-05-06 ENCOUNTER — Encounter: Admitting: Advanced Practice Midwife

## 2024-05-13 ENCOUNTER — Encounter: Admitting: Women's Health

## 2024-05-20 ENCOUNTER — Encounter: Admitting: Advanced Practice Midwife

## 2024-05-27 ENCOUNTER — Encounter: Admitting: Women's Health

## 2024-05-27 ENCOUNTER — Encounter: Payer: Self-pay | Admitting: *Deleted

## 2024-06-09 NOTE — Telephone Encounter (Signed)
 Spoke with patient, confirmed DOB and other demographics. Patient confirmed appointment date & time.

## 2024-06-10 ENCOUNTER — Ambulatory Visit: Admitting: Women's Health

## 2024-06-10 ENCOUNTER — Encounter: Payer: Self-pay | Admitting: Women's Health

## 2024-06-10 DIAGNOSIS — Z8719 Personal history of other diseases of the digestive system: Secondary | ICD-10-CM

## 2024-06-10 DIAGNOSIS — Z8759 Personal history of other complications of pregnancy, childbirth and the puerperium: Secondary | ICD-10-CM

## 2024-06-10 DIAGNOSIS — Z3009 Encounter for other general counseling and advice on contraception: Secondary | ICD-10-CM

## 2024-06-10 DIAGNOSIS — R7989 Other specified abnormal findings of blood chemistry: Secondary | ICD-10-CM

## 2024-06-10 NOTE — Progress Notes (Signed)
 POSTPARTUM VISIT Patient name: Melanie Price MRN [id number]  Date of birth: 1991/02/16 Chief Complaint:   Postpartum Care (Vaginal delivery, no problem)  History of Present Illness:   Melanie Price is a 33 y.o. [phone] Caucasian female being seen today for a postpartum visit. She is 7 weeks postpartum following a spontaneous vaginal delivery at 34.1 gestational weeks at [facility] d/t our NICU being full. IOL: yes, for severe pre-e. Anesthesia: epidural.  Laceration: 1st degree.  Complications: cytotec  for uterine atony, QBL only . Inpatient contraception: no.   Pregnancy complicated by severe pre-e, fatty liver. Tobacco use: no. Substance use disorder: no. Last pap smear: 11/14/23 and results were ASCUS w/ HRHPV positive: other (not 16, 18/45). Colpo @ 16wk +ACW, needs repeat Patient's last menstrual period was 06/06/2024.  Postpartum course has been uncomplicated. Bleeding none. Bowel function is normal. Bladder function is normal. Urinary incontinence? no, fecal incontinence? no Patient is sexually active. Last sexual activity: prior to last period. Desired contraception: has BTS scheduled for 11/25 w/ [facility]. Patient does not want a pregnancy in the future.  Desired family size is 3 children.   Upstream - 06/10/24 1424       Pregnancy Intention Screening   Does the patient want to become pregnant in the next year? No    Does the patient's partner want to become pregnant in the next year? No    Would the patient like to discuss contraceptive options today? No      Contraception Wrap Up   Current Method Abstinence    End Method Abstinence    Contraception Counseling Provided No         The pregnancy intention screening data noted above was reviewed. Potential methods of contraception were discussed. The patient elected to proceed with Abstinence.  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 Denies SI/HI/II.   Edinburgh  Postnatal Depression Scale - 06/10/24 1413       Edinburgh Postnatal Depression Scale:  In the Past 7 Days   I have been able to laugh and see the funny side of things. 1    I have looked forward with enjoyment to things. 1    I have blamed myself unnecessarily when things went wrong. 2    I have been anxious or worried for no good reason. 2    I have felt scared or panicky for no good reason. 2    Things have been getting on top of me. 2    I have been so unhappy that I have had difficulty sleeping. 1    I have felt sad or miserable. 2    I have been so unhappy that I have been crying. 2    The thought of harming myself has occurred to me. 0    Edinburgh Postnatal Depression Scale Total 15             11/14/2023   10:49 AM  GAD 7 : Generalized Anxiety Score  Nervous, Anxious, on Edge 1  Control/stop worrying 1  Worry too much - different things 1  Trouble relaxing 1  Restless 1  Easily annoyed or irritable 1  Afraid - awful might happen 1  Total GAD 7 Score 7     Baby's course has been complicated by 3wk NICU stay. Baby is feeding by bottle. Infant has a pediatrician/family doctor? Yes.  Childcare strategy if returning to work/school: family.  Pt has material needs met for her  and baby: Yes.   Review of Systems:   Pertinent items are noted in HPI Denies Abnormal vaginal discharge w/ itching/odor/irritation, headaches, visual changes, shortness of breath, chest pain, abdominal pain, severe nausea/vomiting, or problems with urination or bowel movements. Pertinent History Reviewed:  Reviewed past medical,surgical, obstetrical and family history.  Reviewed problem list, medications and allergies. OB History  Gravida Para Term Preterm AB Living  4 3 1 2 1 3   SAB IAB Ectopic Multiple Live Births  1   0 3    # Outcome Date GA Lbr Len/2nd Weight Sex Type Anes PTL Lv  4 Preterm 04/16/24 [redacted]w[redacted]d  4 lb 4.1 oz (1.93 kg) F Vag-Spont   LIV     Birth Comments: severe pre-e  3 Preterm  10/19/17 [redacted]w[redacted]d  5 lb 5 oz (2.41 kg) F Vag-Spont EPI  LIV     Complications: Severe pre-eclampsia  2 SAB 11/2015 [redacted]w[redacted]d         1 Term 06/17/14 [redacted]w[redacted]d / 01:36 7 lb 2.6 oz (3.25 kg) M Vag-Spont EPI N LIV     Birth Comments: none   Physical Assessment:   Vitals:   06/10/24 1410  BP: 112/76  Pulse: 80  Weight: 197 lb (89.4 kg)  Height: 5' 8 (1.727 m)  Body mass index is 29.95 kg/m.       Physical Examination:   General appearance: alert, well appearing, and in no distress  Mental status: alert, oriented to person, place, and time  Skin: warm & dry   Cardiovascular: normal heart rate noted   Respiratory: normal respiratory effort, no distress   Breasts: deferred, no complaints   Abdomen: soft, non-tender   Pelvic: examination not indicated. Thin prep pap obtained: No  Rectal: not examined  Extremities: Edema: none   Chaperone: N/A       No results found for this or any previous visit (from the past 24 hours).  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  Mood and well being:  If positive depression screen, discussed and plan developed.  If using tobacco we discussed reduction/cessation and risk of relapse If current substance abuse, we discussed and referral to local resources was offered.   2. Infant care and feeding:  If breastfeeding, discussed returning to work, pumping, breastfeeding-associated pain, guidance regarding return to fertility while lactating if not using another method. If needed, patient  was provided with a letter to be allowed to pump q 2-3hrs to support lactation in a private location with access to a refrigerator to store breastmilk.   Recommended that all caregivers be immunized for flu, pertussis and other preventable communicable diseases If pt does not have material needs met for her/baby, referred to local resources for help obtaining these.  3. Sexuality, contraception and birth spacing Provided guidance regarding sexuality, management of dyspareunia, and resumption of intercourse Discussed avoiding interpregnancy interval <15mths and recommended birth spacing of 18 months  4. Sleep and fatigue Discussed coping options for fatigue and sleep disruption Encouraged family/partner/community support of 4 hrs of uninterrupted  sleep to help with mood and fatigue  5. Physical recovery  If pt had a C/S, assessed incisional pain and providing guidance on normal vs prolonged recovery If pt had a laceration, perineal healing and pain reviewed.  If urinary or fecal incontinence, discussed management and referred to PT or uro/gyn if indicated  Patient is safe to resume physical activity. Discussed attainment of healthy weight.  6.  Chronic disease management Discussed pregnancy complications if any, and their implications for future childbearing and long-term maternal health. Review recommendations for prevention of recurrent pregnancy complications, such as 17 hydroxyprogesterone caproate to reduce risk for recurrent PTB not applicable, or aspirin  to reduce risk of preeclampsia odoesn't plan any more babies. Pt had GDM: no. If yes, 2hr GTT scheduled: not applicable. Reviewed medications and non-pregnant dosing including consideration of whether pt is breastfeeding using a reliable resource such as LactMed: not applicable Referred for f/u w/ PCP or subspecialist providers as indicated: yes  7. Health maintenance Mammogram at 33yo or earlier if indicated Pap smears as  indicated  Meds: No orders of the defined types were placed in this encounter.   Follow-up: Return for next 2 weeks for colpo.   Orders Placed This Encounter  Procedures   Comprehensive metabolic panel with GFR    Suzen JONELLE Fetters CNM, WHNP-BC 06/10/2024 2:50 PM

## 2024-06-22 ENCOUNTER — Telehealth: Payer: Self-pay | Admitting: *Deleted

## 2024-06-22 NOTE — Telephone Encounter (Signed)
 Leah from [facility] called to report that patient had scored 16 on Edinburgh. Pt reported no plans on self harm or harm to others. Rea has help her get set up with a therapist.

## 2024-07-09 ENCOUNTER — Ambulatory Visit: Admitting: Women's Health

## 2024-07-09 ENCOUNTER — Encounter: Payer: Self-pay | Admitting: Women's Health

## 2024-07-09 VITALS — BP 122/79 | HR 87 | Ht 68.0 in | Wt 198.3 lb

## 2024-07-09 DIAGNOSIS — R87610 Atypical squamous cells of undetermined significance on cytologic smear of cervix (ASC-US): Secondary | ICD-10-CM

## 2024-07-09 DIAGNOSIS — Z3202 Encounter for pregnancy test, result negative: Secondary | ICD-10-CM

## 2024-07-09 LAB — POCT URINE PREGNANCY: Preg Test, Ur: NEGATIVE

## 2024-07-09 NOTE — Progress Notes (Signed)
 Here for colpo, on period, bleeding too heavy. Has appt for 12/19 for pre-op for BTS, add colpo to that visit. Abstinence for now.  Suzen FABIENE Fetters, CNM, [facility] 07/09/2024 12:21 PM

## 2024-07-24 ENCOUNTER — Encounter: Payer: Self-pay | Admitting: Obstetrics & Gynecology

## 2024-07-24 ENCOUNTER — Ambulatory Visit: Admitting: Obstetrics & Gynecology

## 2024-07-24 VITALS — BP 123/72 | HR 83 | Ht 68.0 in | Wt 200.5 lb

## 2024-07-24 DIAGNOSIS — R87613 High grade squamous intraepithelial lesion on cytologic smear of cervix (HGSIL): Secondary | ICD-10-CM

## 2024-07-24 DIAGNOSIS — R87610 Atypical squamous cells of undetermined significance on cytologic smear of cervix (ASC-US): Secondary | ICD-10-CM

## 2024-08-16 NOTE — Progress Notes (Signed)
" ° ° °  Colposcopy Procedure Note:    Colposcopy Procedure Note  Indications:  ASCUS + HR HPV negative 16/18   2019 ASCCP recommendation:  Smoker:  Yes.   New sexual partner:  No.  :   History of abnormal Pap: no  Procedure Details  The risks and benefits of the procedure and Written informed consent obtained.  Speculum placed in vagina and excellent visualization of cervix achieved, cervix swabbed x 3 with acetic acid solution.  Findings: Adequate colposcopy is noted today.  Cervix: no visible lesions, no mosaicism, no punctation, and no abnormal vasculature; SCJ visualized 360 degrees without lesions and no biopsies taken. Vaginal inspection: normal without visible lesions. Vulvar colposcopy: vulvar colposcopy not performed.  Specimens: none  Complications: none.  Colposcopic Impression: Adequate with normal findings  Plan(Based on 2019 ASCCP recommendations) Follow up HPV based cytology 1 year   "

## 2024-08-17 ENCOUNTER — Encounter: Payer: Self-pay | Admitting: Obstetrics & Gynecology

## 2024-08-17 ENCOUNTER — Other Ambulatory Visit: Payer: Self-pay | Admitting: Women's Health

## 2024-08-17 MED ORDER — LEVONORGESTREL 1.5 MG PO TABS: 1.5000 mg | ORAL_TABLET | Freq: Once | ORAL | 0 refills | Status: AC
